# Patient Record
Sex: Female | Born: 1950 | Hispanic: No | Marital: Married | Smoking: Never smoker
Health system: Southern US, Community
[De-identification: ages and names within clinical notes are randomized; demographics above are authoritative.]

## PROBLEM LIST (undated history)

## (undated) DIAGNOSIS — M810 Age-related osteoporosis without current pathological fracture: Secondary | ICD-10-CM

## (undated) DIAGNOSIS — B019 Varicella without complication: Secondary | ICD-10-CM

## (undated) DIAGNOSIS — D869 Sarcoidosis, unspecified: Secondary | ICD-10-CM

## (undated) DIAGNOSIS — D72820 Lymphocytosis (symptomatic): Secondary | ICD-10-CM

## (undated) HISTORY — DX: Varicella without complication: B01.9

## (undated) HISTORY — DX: Lymphocytosis (symptomatic): D72.820

## (undated) HISTORY — DX: Age-related osteoporosis without current pathological fracture: M81.0

## (undated) HISTORY — DX: Sarcoidosis, unspecified: D86.9

---

## 1977-06-21 HISTORY — PX: TUBAL LIGATION: SHX77

## 1993-04-29 ENCOUNTER — Encounter: Payer: Self-pay | Admitting: Pulmonary Disease

## 1993-05-11 ENCOUNTER — Encounter: Payer: Self-pay | Admitting: Pulmonary Disease

## 1998-03-26 ENCOUNTER — Other Ambulatory Visit: Admission: RE | Admit: 1998-03-26 | Discharge: 1998-03-26 | Payer: Self-pay | Admitting: Family Medicine

## 1998-06-21 HISTORY — PX: UTERINE ARTERY EMBOLIZATION: SHX2629

## 1998-11-17 ENCOUNTER — Encounter: Payer: Self-pay | Admitting: Obstetrics and Gynecology

## 1998-11-17 ENCOUNTER — Ambulatory Visit (HOSPITAL_COMMUNITY): Admission: RE | Admit: 1998-11-17 | Discharge: 1998-11-17 | Payer: Self-pay | Admitting: Obstetrics and Gynecology

## 1998-11-20 ENCOUNTER — Ambulatory Visit (HOSPITAL_COMMUNITY): Admission: RE | Admit: 1998-11-20 | Discharge: 1998-11-20 | Payer: Self-pay | Admitting: Obstetrics and Gynecology

## 1998-11-20 ENCOUNTER — Other Ambulatory Visit: Admission: RE | Admit: 1998-11-20 | Discharge: 1998-11-20 | Payer: Self-pay | Admitting: Obstetrics and Gynecology

## 1998-11-20 ENCOUNTER — Encounter: Payer: Self-pay | Admitting: Obstetrics and Gynecology

## 1998-12-12 ENCOUNTER — Observation Stay (HOSPITAL_COMMUNITY): Admission: RE | Admit: 1998-12-12 | Discharge: 1998-12-13 | Payer: Self-pay | Admitting: Interventional Radiology

## 1998-12-12 ENCOUNTER — Encounter: Payer: Self-pay | Admitting: Interventional Radiology

## 2000-04-12 ENCOUNTER — Other Ambulatory Visit: Admission: RE | Admit: 2000-04-12 | Discharge: 2000-04-12 | Payer: Self-pay | Admitting: Family Medicine

## 2000-09-09 ENCOUNTER — Encounter: Payer: Self-pay | Admitting: Family Medicine

## 2000-09-09 ENCOUNTER — Encounter: Admission: RE | Admit: 2000-09-09 | Discharge: 2000-09-09 | Payer: Self-pay | Admitting: Family Medicine

## 2000-09-29 ENCOUNTER — Encounter: Admission: RE | Admit: 2000-09-29 | Discharge: 2000-09-29 | Payer: Self-pay | Admitting: Family Medicine

## 2000-09-29 ENCOUNTER — Encounter: Payer: Self-pay | Admitting: Family Medicine

## 2000-10-28 ENCOUNTER — Encounter: Payer: Self-pay | Admitting: Pulmonary Disease

## 2001-04-17 ENCOUNTER — Encounter: Admission: RE | Admit: 2001-04-17 | Discharge: 2001-04-17 | Payer: Self-pay | Admitting: Pulmonary Disease

## 2001-04-17 ENCOUNTER — Encounter: Payer: Self-pay | Admitting: Pulmonary Disease

## 2001-06-01 ENCOUNTER — Ambulatory Visit (HOSPITAL_COMMUNITY): Admission: RE | Admit: 2001-06-01 | Discharge: 2001-06-01 | Payer: Self-pay | Admitting: Family Medicine

## 2001-06-01 ENCOUNTER — Encounter: Payer: Self-pay | Admitting: Family Medicine

## 2002-08-17 ENCOUNTER — Other Ambulatory Visit: Admission: RE | Admit: 2002-08-17 | Discharge: 2002-08-17 | Payer: Self-pay | Admitting: Family Medicine

## 2002-12-17 ENCOUNTER — Encounter: Payer: Self-pay | Admitting: Pulmonary Disease

## 2002-12-17 ENCOUNTER — Encounter: Admission: RE | Admit: 2002-12-17 | Discharge: 2002-12-17 | Payer: Self-pay | Admitting: Pulmonary Disease

## 2002-12-27 ENCOUNTER — Ambulatory Visit (HOSPITAL_COMMUNITY): Admission: RE | Admit: 2002-12-27 | Discharge: 2002-12-27 | Payer: Self-pay | Admitting: Family Medicine

## 2002-12-27 ENCOUNTER — Encounter: Payer: Self-pay | Admitting: Family Medicine

## 2004-07-27 ENCOUNTER — Other Ambulatory Visit: Admission: RE | Admit: 2004-07-27 | Discharge: 2004-07-27 | Payer: Self-pay | Admitting: Family Medicine

## 2005-04-07 ENCOUNTER — Ambulatory Visit: Payer: Self-pay | Admitting: Pulmonary Disease

## 2005-05-25 ENCOUNTER — Encounter: Payer: Self-pay | Admitting: Pulmonary Disease

## 2005-10-21 ENCOUNTER — Ambulatory Visit (HOSPITAL_COMMUNITY): Admission: RE | Admit: 2005-10-21 | Discharge: 2005-10-21 | Payer: Self-pay | Admitting: Family Medicine

## 2006-03-15 ENCOUNTER — Ambulatory Visit: Payer: Self-pay | Admitting: Pulmonary Disease

## 2006-08-29 ENCOUNTER — Ambulatory Visit: Payer: Self-pay | Admitting: Pulmonary Disease

## 2007-02-23 ENCOUNTER — Ambulatory Visit: Payer: Self-pay | Admitting: Pulmonary Disease

## 2007-02-23 ENCOUNTER — Encounter: Payer: Self-pay | Admitting: Pulmonary Disease

## 2007-02-24 DIAGNOSIS — J984 Other disorders of lung: Secondary | ICD-10-CM

## 2007-02-24 DIAGNOSIS — J309 Allergic rhinitis, unspecified: Secondary | ICD-10-CM | POA: Insufficient documentation

## 2007-02-24 DIAGNOSIS — D869 Sarcoidosis, unspecified: Secondary | ICD-10-CM

## 2007-02-24 HISTORY — DX: Sarcoidosis, unspecified: D86.9

## 2007-03-10 ENCOUNTER — Ambulatory Visit (HOSPITAL_COMMUNITY): Admission: RE | Admit: 2007-03-10 | Discharge: 2007-03-10 | Payer: Self-pay | Admitting: Family Medicine

## 2007-03-22 ENCOUNTER — Ambulatory Visit: Payer: Self-pay | Admitting: Internal Medicine

## 2007-11-07 ENCOUNTER — Encounter: Payer: Self-pay | Admitting: Pulmonary Disease

## 2008-03-04 ENCOUNTER — Encounter: Payer: Self-pay | Admitting: Pulmonary Disease

## 2008-08-27 ENCOUNTER — Ambulatory Visit: Payer: Self-pay | Admitting: Pulmonary Disease

## 2008-09-25 ENCOUNTER — Ambulatory Visit: Payer: Self-pay | Admitting: Pulmonary Disease

## 2008-09-26 ENCOUNTER — Encounter: Payer: Self-pay | Admitting: Pulmonary Disease

## 2008-11-02 ENCOUNTER — Emergency Department (HOSPITAL_COMMUNITY): Admission: EM | Admit: 2008-11-02 | Discharge: 2008-11-02 | Payer: Self-pay | Admitting: Emergency Medicine

## 2009-11-02 ENCOUNTER — Ambulatory Visit: Payer: Self-pay | Admitting: Family Medicine

## 2009-11-02 DIAGNOSIS — J0100 Acute maxillary sinusitis, unspecified: Secondary | ICD-10-CM

## 2009-12-26 ENCOUNTER — Ambulatory Visit: Payer: Self-pay | Admitting: Pulmonary Disease

## 2010-05-01 ENCOUNTER — Ambulatory Visit (HOSPITAL_COMMUNITY): Admission: RE | Admit: 2010-05-01 | Discharge: 2010-05-01 | Payer: Self-pay | Admitting: Family Medicine

## 2010-05-12 ENCOUNTER — Telehealth: Payer: Self-pay | Admitting: Pulmonary Disease

## 2010-05-25 ENCOUNTER — Telehealth: Payer: Self-pay | Admitting: Pulmonary Disease

## 2010-07-12 ENCOUNTER — Encounter: Payer: Self-pay | Admitting: Family Medicine

## 2010-07-20 ENCOUNTER — Telehealth: Payer: Self-pay | Admitting: Pulmonary Disease

## 2010-07-23 NOTE — Progress Notes (Signed)
Summary: dr Reche Dixon asking for dr clance to return call  Phone Note From Other Clinic   Caller: [facility]PARTMENT OF [facility]MAT[facility]Y--[phone] Call For: clance Summary of Call: Dr. Reche Dixon is treating patient at his clinic, asking for Dr. Shelle Iron to call him about patient.  Dr. Reche Dixon is aware Dr. Shelle Iron will not be in until tomorrow. Initial call taken by: Lehman Prom,  May 12, 2010 9:50 AM  Follow-up for Phone Call        I will forward to Dr. Shelle Iron to address on his return . Carron Curie CMA  May 12, 2010 9:53 AM   Additional Follow-up for Phone Call Additional follow up Details #1::        talked with him.  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  I told him this is ok from a pulmonary standpoint. Additional Follow-up by: Barbaraann Share MD,  May 13, 2010 4:49 PM

## 2010-07-23 NOTE — Assessment & Plan Note (Signed)
Summary: rov for pulmonary sarcoidosis   Primary Provider/Referring Provider:  Dr Ace Gins  CC:  Yearly Follow up.  Pt states breathing is doing well overall.  States she will have an occ nonprod cough.  Denies SOB, wheezing, and tightness is chest.  .  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  At the last visit, her cxr was actually improved since she had been on humira per dermatology, and her pfts were normal.  She comes in for her yearly followup where she is doing very well.  She has minimal cough, is staying active, and has stable exertional tolerance.    Current Medications (verified): 1)  Humira 40 Mg/0.36ml Kit (Adalimumab) .... Every Other Week 2)  Amoxicillin 875 Mg Tabs (Amoxicillin) .... One By Mouth Two Times A Day  Allergies (verified): No Known Drug Allergies  Review of Systems       The patient complains of non-productive cough, indigestion, and joint stiffness or pain.  The patient denies shortness of breath with activity, shortness of breath at rest, productive cough, coughing up blood, chest pain, irregular heartbeats, acid heartburn, loss of appetite, weight change, abdominal pain, difficulty swallowing, sore throat, tooth/dental problems, headaches, nasal congestion/difficulty breathing through nose, sneezing, itching, ear ache, anxiety, depression, hand/feet swelling, rash, change in color of mucus, and fever.    Vital Signs:  Patient profile:   60 year old female Height:      61.5 inches Weight:      119 pounds BMI:     22.20 O2 Sat:      98 % on Room air Temp:     98.1 degrees F oral Pulse rate:   90 / minute BP sitting:   120 / 80  (left arm) Cuff size:   regular  Vitals Entered By: Gweneth Dimitri RN (December 26, 2009 12:01 PM)  O2 Flow:  Room air CC: Yearly Follow up.  Pt states breathing is doing well overall.  States she will have an occ nonprod cough.  Denies SOB, wheezing, tightness is chest.   Comments  Medications reviewed with patient Daytime contact number verified with patient. Gweneth Dimitri RN  December 26, 2009 12:01 PM    Physical Exam  General:  wd female in nad Lungs:  totally clear to auscultation Heart:  rrr, no mrg Extremities:  no edema or cyanosis Neurologic:  alert and oriented, moves all 4.   Impression & Recommendations:  Problem # 1:  SARCOIDOSIS (ICD-135) the pt is doing well from a pulmonary standpoint.  She has excellent exercise tolerance, and no signficant cough or chest congestion.  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  Will check her yearly cxr today, but will hold off on pfts this year since she is doing so well.  She will need next year though, and as needed if she develops increased symptoms.  Medications Added to Medication List This Visit: 1)  Humira 40 Mg/0.63ml Kit (Adalimumab) .... Every other week  Other Orders: Est. Patient Level III (21308) T-2 View CXR (71020TC)  Patient Instructions: 1)  will check cxr today, and will call you with results.  Will also send the results to your dermatologist. 2)  followup with me in one year, or sooner if problems.

## 2010-07-23 NOTE — Assessment & Plan Note (Signed)
Summary: Sinus pressure, eye pressure, runny nose-yellowih x 1 wk rm 3   Vital Signs:  Patient Profile:   60 Years Old Female CC:      Cold & URI symptoms Height:     62 inches Weight:      117 pounds O2 Sat:      100 % O2 treatment:    Room Air Temp:     97.8 degrees F Pulse rate:   76 / minute Pulse rhythm:   regular Resp:     16 per minute BP sitting:   140 / 75  (right arm) Cuff size:   regular  Vitals Entered By: Areta Haber CMA (Nov 02, 2009 1:15 PM)                  Current Allergies: No known allergies History of Present Illness Chief Complaint: Cold & URI symptoms History of Present Illness: Subjective:  Patient complains of persistent sinus congestion over the past several months, worse over the past month.  One week ago she developed right facial soreness/fullness, and increased nasal congestion.  No fevers, chills, and sweats.  No sore throat or cough  Current Problems: ACUTE MAXILLARY SINUSITIS (ICD-461.0) DIABETES MELLITUS, TYPE I (ICD-250.01) PULMONARY NODULE (ICD-518.89) ALLERGIC RHINITIS (ICD-477.9) SARCOIDOSIS (ICD-135)   Current Meds HUMIRA 20 MG/0.4ML KIT (ADALIMUMAB) 1 injection every other week AMOXICILLIN 875 MG TABS (AMOXICILLIN) One by mouth two times a day  REVIEW OF SYSTEMS Constitutional Symptoms      Denies fever, chills, night sweats, weight loss, weight gain, and fatigue.  Eyes       Complains of eye pain.      Denies change in vision, eye discharge, glasses, contact lenses, and eye surgery. Ear/Nose/Throat/Mouth       Complains of frequent runny nose and sinus problems.      Denies hearing loss/aids, change in hearing, ear pain, ear discharge, dizziness, frequent nose bleeds, sore throat, hoarseness, and tooth pain or bleeding.      Comments: yellowish x 1 wk Respiratory       Denies dry cough, productive cough, wheezing, shortness of breath, asthma, bronchitis, and emphysema/COPD.  Cardiovascular       Denies murmurs, chest  pain, and tires easily with exhertion.    Gastrointestinal       Denies stomach pain, nausea/vomiting, diarrhea, constipation, blood in bowel movements, and indigestion. Genitourniary       Denies painful urination, kidney stones, and loss of urinary control. Neurological       Complains of headaches.      Denies paralysis, seizures, and fainting/blackouts. Musculoskeletal       Denies muscle pain, joint pain, joint stiffness, decreased range of motion, redness, swelling, muscle weakness, and gout.  Skin       Denies bruising, unusual mles/lumps or sores, and hair/skin or nail changes.  Psych       Denies mood changes, temper/anger issues, anxiety/stress, speech problems, depression, and sleep problems. Other Comments: Pt has not seen PCP for this.   Past History:  Risk Factors: Smoking Status: never (11/02/2009)  Past Medical History: Psacordosis  Past Surgical History: Tubal ligation  Family History: Family History Hypertension  Social History: Married Never Smoked Alcohol use-yes - occassionally Drug use-no Regular exercise-yes Drug Use:  no Does Patient Exercise:  yes   Objective:  No acute distress  Eyes:  Pupils are equal, round, and reactive to light and accomdation.  Extraocular movement is intact.  Conjunctivae are not inflamed.  Ears:  Canals normal.  Tympanic membranes normal.   Nose:  Normal septum.  Normal turbinates, mildly congested.   Right maxillary sinus tenderness present.  Mouth:  Mild tenderness over right mandibular crowns Pharynx:  Normal  Neck:  Supple.  No adenopathy is present.  Assessment New Problems: ACUTE MAXILLARY SINUSITIS (ICD-461.0) DIABETES MELLITUS, TYPE I (ICD-250.01)   Plan New Medications/Changes: AMOXICILLIN 875 MG TABS (AMOXICILLIN) One by mouth two times a day  #20 x 0, 11/02/2009, Donna Christen MD  New Orders: New Patient Level III [phone] Planning Comments:   Begin amoxicillin for 10 days.   Expectorant/decongestant.  Saline nasal irrigation. Recommend that she follow-up with her dentist also.   The patient and/or caregiver has been counseled thoroughly with regard to medications prescribed including dosage, schedule, interactions, rationale for use, and possible side effects and they verbalize understanding.  Diagnoses and expected course of recovery discussed and will return if not improved as expected or if the condition worsens. Patient and/or caregiver verbalized understanding.  Prescriptions: AMOXICILLIN 875 MG TABS (AMOXICILLIN) One by mouth two times a day  #20 x 0   Entered and Authorized by:   Donna Christen MD   Signed by:   Donna Christen MD on 11/02/2009   Method used:   Print then Give to Patient   RxID:   [id number]

## 2010-07-23 NOTE — Progress Notes (Signed)
Summary: dr hall req to speak to kc (at his convenience) re: pt  Phone Note From Other Clinic   Caller: dr Lurena Joiner hall at [facility] Call For: clance Summary of Call: requests to speak to dr clance re: pt's meds/ does kc want to eval pt for flareup? pt's coughing has increased- new lesions. dr hall has questions concerning recs for meds. call at your convenience (no need to get nurse or dr out of room). pager # [phone] Initial call taken by: Tivis Ringer, CNA,  May 25, 2010 11:54 AM  Follow-up for Phone Call        called and spoke with resident, letting her know I had already spoken to her attending (jorizzo) on 11/22. Follow-up by: Barbaraann Share MD,  June 01, 2010 4:43 PM

## 2010-08-06 NOTE — Progress Notes (Signed)
Summary: speak to nurse  Phone Note Call from Patient Call back at Home Phone [phone]   Caller: Patient Call For: clance Summary of Call: Pt states that her dermatologist (Dr. Reche Dixon) wants to put her on another med but he wants to speak with [facility] before he does this he can be reached at [phone]. Initial call taken by: Darletta Moll,  July 20, 2010 10:05 AM  Follow-up for Phone Call        called and spoke with pt.  pt states she has sarcoid and is seeing Dr. Reche Dixon a dermatologist at [facility] for the sacroid on her skin.  Pt states she has been on Humira injections twice a week for 2 years for the sarcoid on her skin but is now having outbreaks.  Dr. Reche Dixon wants to start pt on Methotrexate to help with these outbreaks but would like to speak to Dr. Shelle Iron first to discuss options.  Dr. Felicita Gage number is [phone].  Aundra Millet Reynolds LPN  July 20, 2010 10:18 AM   Additional Follow-up for Phone Call Additional follow up Details #1::        have spoken with him before about mtx...told was ok from my standpoint. left message on his answering machine with my number to call back. Additional Follow-up by: Barbaraann Share MD,  July 29, 2010 10:14 AM

## 2011-04-20 ENCOUNTER — Other Ambulatory Visit (HOSPITAL_COMMUNITY): Payer: Self-pay | Admitting: Family Medicine

## 2011-04-20 DIAGNOSIS — Z1231 Encounter for screening mammogram for malignant neoplasm of breast: Secondary | ICD-10-CM

## 2011-05-19 ENCOUNTER — Ambulatory Visit (HOSPITAL_COMMUNITY): Payer: Self-pay

## 2011-07-01 ENCOUNTER — Ambulatory Visit (HOSPITAL_COMMUNITY)
Admission: RE | Admit: 2011-07-01 | Discharge: 2011-07-01 | Disposition: A | Discharge status: Home / Self Care | Payer: BC Managed Care – PPO | Source: Ambulatory Visit | Attending: Family Medicine | Admitting: Family Medicine

## 2011-07-01 DIAGNOSIS — Z1231 Encounter for screening mammogram for malignant neoplasm of breast: Secondary | ICD-10-CM

## 2011-08-19 ENCOUNTER — Ambulatory Visit: Payer: BC Managed Care – PPO | Admitting: Pulmonary Disease

## 2011-08-31 ENCOUNTER — Ambulatory Visit (INDEPENDENT_AMBULATORY_CARE_PROVIDER_SITE_OTHER): Payer: BC Managed Care – PPO | Admitting: Pulmonary Disease

## 2011-08-31 ENCOUNTER — Ambulatory Visit (INDEPENDENT_AMBULATORY_CARE_PROVIDER_SITE_OTHER)
Admission: RE | Admit: 2011-08-31 | Discharge: 2011-08-31 | Disposition: A | Discharge status: Home / Self Care | Payer: BC Managed Care – PPO | Source: Ambulatory Visit | Attending: Pulmonary Disease | Admitting: Pulmonary Disease

## 2011-08-31 ENCOUNTER — Encounter: Payer: Self-pay | Admitting: Pulmonary Disease

## 2011-08-31 VITALS — BP 146/80 | HR 94 | Temp 98.9°F | Ht 61.5 in | Wt 122.6 lb

## 2011-08-31 DIAGNOSIS — D869 Sarcoidosis, unspecified: Secondary | ICD-10-CM

## 2011-08-31 NOTE — Progress Notes (Signed)
  Subjective:    Patient ID: Courtney Bray, female    DOB: 12-17-1950, 61 y.o.   MRN: [id number]  HPI The patient comes in today for followup of her pulmonary sarcoidosis.  This primarily manifest as cough, and also skin involvement.  She is being followed closely at [facility] for her cutaneous sarcoid, and is being managed on Humira at this point.  She has done very well with the medication, and it has also helped control her cough.  The patient is very satisfied with her exertional tolerance at this time, and denies any issues with shortness of breath.  As stated above, her cough has not been a problem since being on the Humira.  She has not had a recent chest x-ray or pulmonary function studies.   Review of Systems  Constitutional: Negative for fever and unexpected weight change.  HENT: Negative for ear pain, nosebleeds, congestion, sore throat, rhinorrhea, sneezing, trouble swallowing, dental problem, postnasal drip and sinus pressure.   Eyes: Negative for redness and itching.  Respiratory: Negative for cough, chest tightness, shortness of breath and wheezing.   Cardiovascular: Negative for palpitations and leg swelling.  Gastrointestinal: Negative for nausea and vomiting.  Genitourinary: Negative for dysuria.  Musculoskeletal: Negative for joint swelling.  Skin: Negative for rash.  Neurological: Negative for headaches.  Hematological: Does not bruise/bleed easily.  Psychiatric/Behavioral: Negative for dysphoric mood. The patient is not nervous/anxious.        Objective:   Physical Exam Well-developed female in no acute distress Nose without purulence or discharge noted Chest completely clear to auscultation, no wheezing Cardiac exam with regular rate and rhythm Lower extremities without edema, no cyanosis Alert and oriented, moves all 4 extremities.       Assessment & Plan:

## 2011-08-31 NOTE — Assessment & Plan Note (Signed)
The patient is doing very well from a pulmonary standpoint.  She is having no significant cough or dyspnea on exertion.  I suspect the Humira is helping her pulmonary disease as well as her skin disease.  She does need to have a followup chest x-ray and pulmonary function studies, and we will schedule these today.  I have asked her to stay as active as possible, and to call as if she sees any change in her breathing.  Otherwise, she will followup with me in one year.

## 2011-08-31 NOTE — Patient Instructions (Signed)
Will check cxr today, and call you with results.  Please schedule followup breathing studies at your convenience over the next 1-2 mos.  Will call you with results. followup with me in one year if doing well.

## 2011-09-01 ENCOUNTER — Telehealth: Payer: Self-pay | Admitting: Pulmonary Disease

## 2011-09-01 NOTE — Progress Notes (Signed)
Quick Note:  Spoke with pt. I informed her of cxr results per Dr. Shelle Iron. She verbalized understanding of this and voiced no further questions/concerns at this time. ______

## 2011-09-01 NOTE — Telephone Encounter (Signed)
Notes Recorded by Tylene Fantasia. Reynolds, LPN on 1/61/0960 at 2:22 PM [facility] for pt TCB ------  Notes Recorded by Barbaraann Share, MD on 08/31/2011 at 6:06 PM Please let pt know that her cxr is stable from prior. Good news.   Called, spoke with pt.  I informed her of cxr results per Dr. Shelle Iron.  She verbalized understanding of this and voiced no further questions/concerns at this time.

## 2013-01-19 ENCOUNTER — Telehealth: Payer: Self-pay | Admitting: Pulmonary Disease

## 2013-01-19 NOTE — Telephone Encounter (Signed)
Patient Instructions  08/2011  Will check cxr today, and call you with results.  Please schedule followup breathing studies at your convenience over the next 1-2 mos. Will call you with results.  followup with me in one year if doing well.   --- Pt never had PFT done as advised. She is even over due for appt. Pt has nothing scheduled yet. She wants to know if she still needs the PFT prior to coming in. I advised will confirm with [facility] if he would like PFT prior to seeing her or for her to come in for appt 1st. She is fine with waiting for him to come back in office. Please advise KC thanks

## 2013-01-29 NOTE — Telephone Encounter (Signed)
Would like to have pfts available when I see her if she never had done last year.  Can be same or different day.

## 2013-01-30 NOTE — Telephone Encounter (Signed)
Called spoke with patient, advised pt that [facility] would like for her to have a PFT prior to ov w/ him.  1st available for PFT here is 9.11.14-- pt stated she will be on vacation at that time.  PFT and OV scheduled with KC 9.25.14 @ 0900 and 1015.  Pt okay with this date and time and will call the office if anything further is needed prior to appt.  Nothing further needed at this time; will sign off.

## 2013-03-15 ENCOUNTER — Ambulatory Visit (INDEPENDENT_AMBULATORY_CARE_PROVIDER_SITE_OTHER): Payer: BC Managed Care – PPO | Admitting: Pulmonary Disease

## 2013-03-15 ENCOUNTER — Encounter: Payer: Self-pay | Admitting: Pulmonary Disease

## 2013-03-15 VITALS — BP 130/82 | HR 82 | Temp 97.6°F | Ht 61.0 in | Wt 120.0 lb

## 2013-03-15 DIAGNOSIS — D869 Sarcoidosis, unspecified: Secondary | ICD-10-CM

## 2013-03-15 LAB — PULMONARY FUNCTION TEST

## 2013-03-15 NOTE — Assessment & Plan Note (Signed)
The patient is doing very well from a sarcoid standpoint, with no increasing shortness of breath or decreased exertional tolerance.  Her x-ray has been stable, and although her total lung capacity it is a little lower, her diffusion capacity is completely normal and stable.  Her cough and skin changes are totally controlled on Humira through her dermatologist.  Overall, she is satisfied with her symptoms, and I have encouraged her to continue on an aggressive exercise and conditioning program.  I would like to see her back in one year, but do not feel that she needs yearly x-rays and PFTs.  Will simply do these periodically and as needed.

## 2013-03-15 NOTE — Patient Instructions (Addendum)
Your breathing studies are stable.  Good news.  Will check xrays and PFT's periodically rather than yearly Stay as active as possible. Make sure you get your flu shot this fall. followup with me in one year, but please call if having increased symptoms.

## 2013-03-15 NOTE — Progress Notes (Signed)
  Subjective:    Patient ID: Courtney Bray, female    DOB: 1950/11/26, 62 y.o.   MRN: [id number]  HPI Patient comes in today for followup of her known sarcoidosis.  This primarily manifests as skin changes, as well as cough.  She is being treated with Humira by her dermatologist, and this has also controlled her cough very well.  She reports no shortness of breath, and has been staying very active.  Her chest x-ray last year was totally stable, and her breathing test today are stable as well.   Review of Systems  Constitutional: Negative for fever and unexpected weight change.  HENT: Negative for ear pain, nosebleeds, congestion, sore throat, rhinorrhea, sneezing, trouble swallowing, dental problem, postnasal drip and sinus pressure.   Eyes: Negative for redness and itching.  Respiratory: Negative for cough, chest tightness, shortness of breath and wheezing.   Cardiovascular: Negative for palpitations and leg swelling.  Gastrointestinal: Negative for nausea and vomiting.  Genitourinary: Negative for dysuria.  Musculoskeletal: Negative for joint swelling.  Skin: Negative for rash.  Neurological: Negative for headaches.  Hematological: Does not bruise/bleed easily.  Psychiatric/Behavioral: Negative for dysphoric mood. The patient is not nervous/anxious.        Objective:   Physical Exam Well-developed female in no acute distress Nose without purulence or discharge noted Neck without lymphadenopathy or thyromegaly Chest totally clear to auscultation, no wheezing Cardiac exam is regular rate and rhythm Lower extremities without edema, no cyanosis Alert and oriented, moves all 4 extremities.       Assessment & Plan:

## 2013-03-15 NOTE — Progress Notes (Signed)
PFT done today. 

## 2013-04-20 ENCOUNTER — Encounter: Payer: Self-pay | Admitting: Pulmonary Disease

## 2013-04-26 ENCOUNTER — Other Ambulatory Visit: Payer: Self-pay

## 2013-09-11 ENCOUNTER — Other Ambulatory Visit (HOSPITAL_COMMUNITY): Payer: Self-pay | Admitting: Family Medicine

## 2013-09-11 DIAGNOSIS — Z1231 Encounter for screening mammogram for malignant neoplasm of breast: Secondary | ICD-10-CM

## 2013-09-20 ENCOUNTER — Ambulatory Visit (HOSPITAL_COMMUNITY): Payer: BC Managed Care – PPO

## 2013-10-08 ENCOUNTER — Ambulatory Visit (HOSPITAL_COMMUNITY)
Admission: RE | Admit: 2013-10-08 | Discharge: 2013-10-08 | Disposition: A | Discharge status: Home / Self Care | Payer: BC Managed Care – PPO | Source: Ambulatory Visit | Attending: Family Medicine | Admitting: Family Medicine

## 2013-10-08 DIAGNOSIS — Z1231 Encounter for screening mammogram for malignant neoplasm of breast: Secondary | ICD-10-CM

## 2014-03-14 ENCOUNTER — Ambulatory Visit: Payer: BC Managed Care – PPO | Admitting: Pulmonary Disease

## 2014-03-20 ENCOUNTER — Ambulatory Visit: Payer: BC Managed Care – PPO | Admitting: Pulmonary Disease

## 2014-04-22 ENCOUNTER — Encounter: Payer: Self-pay | Admitting: Pulmonary Disease

## 2014-04-22 ENCOUNTER — Ambulatory Visit (INDEPENDENT_AMBULATORY_CARE_PROVIDER_SITE_OTHER): Payer: BC Managed Care – PPO | Admitting: Pulmonary Disease

## 2014-04-22 VITALS — BP 110/60 | HR 80 | Temp 98.3°F | Ht 61.5 in | Wt 124.8 lb

## 2014-04-22 DIAGNOSIS — D869 Sarcoidosis, unspecified: Secondary | ICD-10-CM

## 2014-04-22 NOTE — Assessment & Plan Note (Signed)
The patient is doing very well from a pulmonary standpoint with respect to her sarcoidosis. She is continuing treatment by dermatology for her skin changes, and this in turn has resolved her pulmonary symptoms as well. I would like to see her back in one year to check on things, but she is to call if she has increased symptoms. I will check a chest x-ray at her next visit.

## 2014-04-22 NOTE — Progress Notes (Signed)
   Subjective:    Patient ID: Courtney Bray, female    DOB: 1950/07/17, 63 y.o.   MRN: [id number]  HPI The patient comes in today for follow-up of her known sarcoidosis. This is primarily manifesting as skin involvement, and the patient is on Humira through her dermatologist with significant control of pulmonary symptoms and skin changes. She denies any cough, and has had excellent exertional tolerance. She feels that she is asymptomatic from a pulmonary standpoint.   Review of Systems  Constitutional: Negative for fever and unexpected weight change.  HENT: Negative for congestion, dental problem, ear pain, nosebleeds, postnasal drip, rhinorrhea, sinus pressure, sneezing, sore throat and trouble swallowing.   Eyes: Negative for redness and itching.  Respiratory: Negative for cough, chest tightness, shortness of breath and wheezing.   Cardiovascular: Negative for palpitations and leg swelling.  Gastrointestinal: Negative for nausea and vomiting.  Genitourinary: Negative for dysuria.  Musculoskeletal: Negative for joint swelling.  Skin: Negative for rash.  Neurological: Negative for headaches.  Hematological: Does not bruise/bleed easily.  Psychiatric/Behavioral: Negative for dysphoric mood. The patient is not nervous/anxious.        Objective:   Physical Exam Well-developed female in no acute distress Nose without purulence or discharge noted Neck without lymphadenopathy or thyromegaly Chest totally clear to auscultation, no wheezing Cardiac exam with regular rate and rhythm Lower extremities without edema, no cyanosis Alert and oriented, moves all 4 extremities.       Assessment & Plan:

## 2014-04-22 NOTE — Patient Instructions (Signed)
Continue on your medication thru your dermatologist. Please call if you feel you are having increased pulmonary symptoms. followup with me in one year, and will check a chest xray the same day.

## 2014-10-21 ENCOUNTER — Encounter: Payer: Self-pay | Admitting: Internal Medicine

## 2014-10-21 ENCOUNTER — Ambulatory Visit (INDEPENDENT_AMBULATORY_CARE_PROVIDER_SITE_OTHER): Payer: 59 | Admitting: Internal Medicine

## 2014-10-21 VITALS — BP 132/72 | Temp 98.7°F | Ht 61.0 in | Wt 120.0 lb

## 2014-10-21 DIAGNOSIS — D860 Sarcoidosis of lung: Secondary | ICD-10-CM | POA: Insufficient documentation

## 2014-10-21 DIAGNOSIS — D863 Sarcoidosis of skin: Secondary | ICD-10-CM | POA: Insufficient documentation

## 2014-10-21 DIAGNOSIS — Z79899 Other long term (current) drug therapy: Secondary | ICD-10-CM | POA: Insufficient documentation

## 2014-10-21 DIAGNOSIS — M810 Age-related osteoporosis without current pathological fracture: Secondary | ICD-10-CM | POA: Diagnosis not present

## 2014-10-21 DIAGNOSIS — Z7689 Persons encountering health services in other specified circumstances: Secondary | ICD-10-CM

## 2014-10-21 DIAGNOSIS — Z7189 Other specified counseling: Secondary | ICD-10-CM

## 2014-10-21 NOTE — Progress Notes (Signed)
Pre visit review using our clinic review tool, if applicable. No additional management support is needed unless otherwise documented below in the visit note.  Chief Complaint  Patient presents with  . Establish Care    HPI: Patient  Courtney Bray  64 y.o. comes in today for new patient Health Care visit  Previous PCP [facility] Cre[facility] has left that practice .  Had check up there ? A year ago  Specialists: dr Shelle Ironlance for sarcoid involving lungs and skin  Breathing stable  Yearly check and pfts  Every 3 years  Coughing and wheezing were sx .  Derm  At [facility] dr Renaldo ReelHuang    On humira for 7 years and very helpful for skin and   Lungs also .  Scalp and  Forehead.  Hair loss    Yearly  Check gets ppd yearly  or cxray  bp: is ususally good  Dx with osteoporosis by dexa  Had rash rx to high dose rx vit d .  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   Health Maintenance  Topic Date Due  . HEMOGLOBIN A1C  07/03/1950  . PNEUMOCOCCAL POLYSACCHARIDE VACCINE (1) 02/18/1953  . FOOT EXAM  02/18/1961  . OPHTHALMOLOGY EXAM  02/18/1961  . URINE MICROALBUMIN  02/18/1961  . TETANUS/TDAP  02/18/1970  . HIV Screening  10/20/2015 (Originally 02/18/1966)  . INFLUENZA VACCINE  01/20/2015  . MAMMOGRAM  10/09/2015  . PAP SMEAR  10/20/2015  . COLONOSCOPY  10/20/2018  . ZOSTAVAX  Completed   Health Maintenance Review LIFESTYLE:  Exercise:   3 x per week . Walks no phsycial limitiation Tobacco/ETS: no   Alcohol: per day  ocass Sugar beverages:   Not  Sleep: 5-6 hours  Drug use: no Bone density:  Years ago osteopenia   Then osteoporsis  Colonoscopy:  2010 in [place].  ? 10 years recall  PAP: 2014?  g3 p4   MAMMO: 2015 Sis    ROS:  GEN/ HEENT: No fever, significant weight changes sweats headaches vision problems hearing changes, CV/ PULM; No chest pain shortness of breath cough, syncope,edema  change in exercise tolerance. ocass skipped beat as per husband but no  palpitations of sx  GI /GU: No adominal pain, vomiting, change in bowel habits. No blood in the stool. No significant GU symptoms. SKIN/HEME: ,no new see above acute skin rashes suspicious lesions or bleeding. No lymphadenopathy, nodules, masses.  NEURO/ PSYCH:  No neurologic signs such as weakness numbness. No depression anxiety. IMM/ Allergy: No unusual infections.  Allergy .   REST of 12 system review negative except as per HPI   Past Medical History  Diagnosis Date  . 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  PFT's 2010:  No obstruction, TLC 3.93 (87%), DLCO 89%. PFT's 2014:  No obstruction, TLC 3.07, DLCO 91%    . Chicken pox     Past Surgical History  Procedure Laterality Date  . Tubal ligation  1979  . Uterine artery embolization  2000    Family History  Problem Relation Age of Onset  . Heart disease Father   . Stroke Father   . Dementia Father   . Dementia Mother   . High blood pressure Mother   . High blood pressure Sister     History   Social History  . Marital  Status: Married    Spouse Name: N/A  . Number of Children: N/A  . Years of Education: N/A   Social History Main Topics  . Smoking status: Never Smoker   . Smokeless tobacco: Never Used  . Alcohol Use: 0.0 oz/week    0 Standard drinks or equivalent per week     Comment: Occasional  . Drug Use: No  . Sexual Activity:    Partners: Male   Other Topics Concern  . 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   Orig from [place]   in [place] since 1988   [name]     Outpatient Encounter Prescriptions as of 10/21/2014  . Order #: [id number] Class:  Historical Med  . [DISCONTINUED] Order #: [id number] Class: Historical Med    EXAM:  BP 132/72 mmHg  Temp(Src) 98.7 F (37.1 C) (Oral)  Ht  (1.549 m)  Wt 120 lb (54.432 kg)  BMI 22.69 kg/m2  Body mass index is 22.69 kg/(m^2).  Physical Exam: Vital signs reviewed GNF:AOZH is a well-developed well-nourished alert cooperative    who appearsr stated age in no acute distress.  HEENT: normocephalic atraumatic , Eyes: PERRL EOM's full, conjunctiva clear, NECK: supple without masses, thyromegaly or bruits. CHEST/PULM:  Clear to auscultation and percussion breath sounds equal no wheeze , rales or rhonchi. No chest wall deformities or tenderness. CV: PMI is nondisplaced, S1 S2 no gallops, murmurs, rubs. Peripheral pulses are full without delay.No JVD .  ABDOMEN: Bowel sounds normal nontender  No guard or rebound, no hepato splenomegal no CVA tenderness.   Extremtities:  No clubbing cyanosis or edema, no acute joint swelling or redness no focal atrophy NEURO:  Oriented x3, cranial nerves 3-12 appear to be intact, no obvious focal weakness,gait within normal limits no abnormal reflexes or asymmetrical SKIN: No acute rashes normal turgor, color, no bruising or petechiae. PSYCH: Oriented, good eye contact, no obvious depression anxiety, cognition and judgment appear normal. 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  Immunizations   Records of any kind  Last pap and report .  Plan wellness and labs in 6 months or as needed . Contact us in the interim as needed .   Healthy lifestyle includes : At least 150 minutes of exercise weeks  , weight at healthy  levels, which is usually   BMI 19-25. Avoid trans fats and processed foods;  Increase fresh fruits and veges to 5 servings per day. And avoid sweet beverages including tea and juice. Mediterranean diet with olive oil and nuts have been noted to be heart and brain healthy . Avoid tobacco products . Limit  alcohol to  7 per week for women and 14 servings for men.  Get adequate sleep . Wear seat belts . Don't text and drive .       Neta Mends. Joshuajames Moehring M.D.  EHR reported dm type 1 and patient doesn't have DM so removed from records.

## 2014-10-21 NOTE — Patient Instructions (Signed)
Get us records from last 2 years  Primary care   Check ups and labs  .  Immunizations   Records of any kind  Last pap and report .  Plan wellness and labs in 6 months or as needed . Contact us in the interim as needed .   Healthy lifestyle includes : At least 150 minutes of exercise weeks  , weight at healthy levels, which is usually   BMI 19-25. Avoid trans fats and processed foods;  Increase fresh fruits and veges to 5 servings per day. And avoid sweet beverages including tea and juice. Mediterranean diet with olive oil and nuts have been noted to be heart and brain healthy . Avoid tobacco products . Limit  alcohol to  7 per week for women and 14 servings for men.  Get adequate sleep . Wear seat belts . Don't text and drive .

## 2014-10-28 ENCOUNTER — Encounter: Payer: Self-pay | Admitting: Internal Medicine

## 2014-10-28 ENCOUNTER — Ambulatory Visit (INDEPENDENT_AMBULATORY_CARE_PROVIDER_SITE_OTHER): Payer: 59 | Admitting: Internal Medicine

## 2014-10-28 VITALS — BP 130/70 | HR 88 | Temp 99.7°F | Ht 61.0 in | Wt 118.5 lb

## 2014-10-28 DIAGNOSIS — Z79899 Other long term (current) drug therapy: Secondary | ICD-10-CM | POA: Diagnosis not present

## 2014-10-28 DIAGNOSIS — D860 Sarcoidosis of lung: Secondary | ICD-10-CM

## 2014-10-28 DIAGNOSIS — J069 Acute upper respiratory infection, unspecified: Secondary | ICD-10-CM

## 2014-10-28 DIAGNOSIS — B9789 Other viral agents as the cause of diseases classified elsewhere: Principal | ICD-10-CM

## 2014-10-28 MED ORDER — HYDROCODONE-HOMATROPINE 5-1.5 MG/5ML PO SYRP: 5.0000 mL | ORAL_SOLUTION | ORAL | Status: DC | PRN

## 2014-10-28 NOTE — Progress Notes (Signed)
Pre visit review using our clinic review tool, if applicable. No additional management support is needed unless otherwise documented below in the visit note.  Chief Complaint  Patient presents with  . Cough  . Sore Throat  . Fatigue    HPI: Patient Courtney Bray  comes in today for SDA for  new problem evaluation.after last visit did develop st x 3 days  Then better and now cough croupy like spaam and here with husband who has health concern about her . Lots of coughing  And lungs hurt. Felt a bit better yesterday tired today temp 99 no chills  No fever. Mostly dry cough   Taking nyquil .   Tired  Some stridor   .   ? If sob congested .  No hemoptysis  No acute wheezing   Concern because of sarcoid dx and on humira.  ROS: See pertinent positives and negatives per HPI. No vomiting new rash etc  Has copy of colonoscopy 2010 10 year recal  Past Medical History  Diagnosis Date  . 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  PFT's 2010:  No obstruction, TLC 3.93 (87%), DLCO 89%. PFT's 2014:  No obstruction, TLC 3.07, DLCO 91%    . Chicken pox     Family History  Problem Relation Age of Onset  . Heart disease Father   . Stroke Father   . Dementia Father   . Dementia Mother   . High blood pressure Mother   . High blood pressure Sister     History   Social History  . Marital Status: Married    Spouse Name: N/A  . Number of Children: N/A  . Years of Education: N/A   Social History Main Topics  . Smoking status: Never Smoker   . Smokeless tobacco: Never Used  . Alcohol Use: 0.0 oz/week    0 Standard drinks or equivalent per week     Comment: Occasional  . Drug Use: No  . Sexual Activity:    Partners: Male   Other Topics Concern  . 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   Orig from [place]   in [place] since 1988   [name]     Outpatient Prescriptions Prior to Visit  Medication Sig Dispense Refill  . HUMIRA PEN 40 MG/0.8ML PNKT Inject every other week for sarcoidosis     No facility-administered medications prior to visit.     EXAM:  BP 130/70 mmHg  Pulse 88  Temp(Src) 99.7 F (37.6 C) (Oral)  Ht 5\' 1"  (1.549 m)  Wt 118 lb 8 oz (53.751 kg)  BMI 22.40 kg/m2  SpO2 98%  Body mass index is 22.4 kg/(m^2).  GENERAL: vitals reviewed and listed above, alert, oriented, appears well hydrated and in no acute distress  Min congestion non toxic  HEENT: atraumatic, conjunctiva  clear, no obvious abnormalities on inspection of external nose and ears tms clear OP : no lesion edema or exudate drainage tracts  NECK: no obvious masses on  inspection palpation  LUNGS: clear to auscultation bilaterally, no wheezes, rales or rhonchi, good air movement CV: HRRR, no clubbing cyanosis or  peripheral edema nl cap refill  MS: moves all extremities without noticeable focal  abnormality PSYCH: pleasant and cooperative, no obvious depression or anxiety  ASSESSMENT AND PLAN:  Discussed the following assessment and plan:  Viral upper respiratory tract infection with cough - croup like exam is reassuring low threshold for intervention w/hx sarcoid cough med for [place]   Sarcoidosis, lung  High risk medication use - humira  ask prescriber  if should delay med when  acutely ill  Total visit 25mins > 50% spent counseling and coordinating care as indicated in above note and in instructions to patient .    Expectant management. huband also     -Patient advised to return or notify health care team  if symptoms worsen ,persist or new concerns arise.  Patient Instructions  This acts like viral  resp infection  croup . Marland Kitchen. You exam is reassuring today . At this time supportive  Cares   Should be feeling improved malaise but the end of the week Contact  us if fever and chills   Shortness of breath  Ask about  [facility]      Iain Sawchuk K. Archer Moist M.D.

## 2014-10-28 NOTE — Patient Instructions (Signed)
This acts like viral  resp infection croup . Marland Kitchen. You exam is reassuring today . At this time supportive  Cares   Should be feeling improved malaise but the end of the week Contact  us if fever and chills   Shortness of breath  Ask about  Whether [facility]

## 2014-11-05 ENCOUNTER — Encounter: Payer: Self-pay | Admitting: Internal Medicine

## 2014-12-16 ENCOUNTER — Other Ambulatory Visit: Payer: Self-pay

## 2015-03-25 ENCOUNTER — Other Ambulatory Visit (INDEPENDENT_AMBULATORY_CARE_PROVIDER_SITE_OTHER): Payer: 59

## 2015-03-25 DIAGNOSIS — Z0000 Encounter for general adult medical examination without abnormal findings: Secondary | ICD-10-CM

## 2015-03-25 LAB — BASIC METABOLIC PANEL
BUN: 12 mg/dL (ref 6–23)
CALCIUM: 9.3 mg/dL (ref 8.4–10.5)
CO2: 29 mEq/L (ref 19–32)
Chloride: 105 mEq/L (ref 96–112)
Creatinine, Ser: 0.64 mg/dL (ref 0.40–1.20)
GFR: 120.11 mL/min (ref >60.00)
GLUCOSE: 88 mg/dL (ref 70–99)
Potassium: 4.3 mEq/L (ref 3.5–5.1)
SODIUM: 140 meq/L (ref 135–145)

## 2015-03-25 LAB — LIPID PANEL
Cholesterol: 201 mg/dL — ABNORMAL HIGH (ref 0–200)
HDL: 66.3 mg/dL (ref >39.00)
LDL CALC: 120 mg/dL — AB (ref 0–99)
NONHDL: 134.83
Total CHOL/HDL Ratio: 3
Triglycerides: 73 mg/dL (ref 0.0–149.0)
VLDL: 14.6 mg/dL (ref 0.0–40.0)

## 2015-03-25 LAB — CBC WITH DIFFERENTIAL/PLATELET
BASOS ABS: 0 10*3/uL (ref 0.0–0.1)
Basophils Relative: 1.1 % (ref 0.0–3.0)
Eosinophils Absolute: 0.2 10*3/uL (ref 0.0–0.7)
Eosinophils Relative: 7.1 % — ABNORMAL HIGH (ref 0.0–5.0)
HCT: 42.5 % (ref 36.0–46.0)
HEMOGLOBIN: 14.3 g/dL (ref 12.0–15.0)
LYMPHS ABS: 2.1 10*3/uL (ref 0.7–4.0)
Lymphocytes Relative: 62.2 % — ABNORMAL HIGH (ref 12.0–46.0)
MCHC: 33.6 g/dL (ref 30.0–36.0)
MCV: 93.3 fl (ref 78.0–100.0)
MONOS PCT: 10.1 % (ref 3.0–12.0)
Monocytes Absolute: 0.3 10*3/uL (ref 0.1–1.0)
NEUTROS PCT: 19.5 % — AB (ref 43.0–77.0)
Neutro Abs: 0.7 10*3/uL — ABNORMAL LOW (ref 1.4–7.7)
Platelets: 260 10*3/uL (ref 150.0–400.0)
RBC: 4.56 Mil/uL (ref 3.87–5.11)
RDW: 12.8 % (ref 11.5–15.5)
WBC: 3.4 10*3/uL — AB (ref 4.0–10.5)

## 2015-03-25 LAB — HEPATIC FUNCTION PANEL
ALBUMIN: 3.9 g/dL (ref 3.5–5.2)
ALK PHOS: 76 U/L (ref 39–117)
ALT: 30 U/L (ref 0–35)
AST: 24 U/L (ref 0–37)
BILIRUBIN TOTAL: 0.6 mg/dL (ref 0.2–1.2)
Bilirubin, Direct: 0.1 mg/dL (ref 0.0–0.3)
Total Protein: 7.2 g/dL (ref 6.0–8.3)

## 2015-03-25 LAB — POCT URINALYSIS DIPSTICK
Bilirubin, UA: NEGATIVE
Blood, UA: NEGATIVE
Glucose, UA: NEGATIVE
KETONES UA: NEGATIVE
Leukocytes, UA: NEGATIVE
Nitrite, UA: NEGATIVE
PH UA: 7
PROTEIN UA: NEGATIVE
SPEC GRAV UA: 1.015
Urobilinogen, UA: 0.2

## 2015-03-25 LAB — TSH: TSH: 1.35 u[IU]/mL (ref 0.35–4.50)

## 2015-04-01 ENCOUNTER — Encounter: Payer: Self-pay | Admitting: Internal Medicine

## 2015-04-01 ENCOUNTER — Ambulatory Visit (INDEPENDENT_AMBULATORY_CARE_PROVIDER_SITE_OTHER): Payer: 59 | Admitting: Internal Medicine

## 2015-04-01 VITALS — BP 126/89 | HR 88 | Temp 98.3°F | Ht 61.5 in | Wt 119.0 lb

## 2015-04-01 DIAGNOSIS — D869 Sarcoidosis, unspecified: Secondary | ICD-10-CM

## 2015-04-01 DIAGNOSIS — M810 Age-related osteoporosis without current pathological fracture: Secondary | ICD-10-CM

## 2015-04-01 DIAGNOSIS — D860 Sarcoidosis of lung: Secondary | ICD-10-CM

## 2015-04-01 DIAGNOSIS — D72820 Lymphocytosis (symptomatic): Secondary | ICD-10-CM

## 2015-04-01 DIAGNOSIS — D863 Sarcoidosis of skin: Secondary | ICD-10-CM

## 2015-04-01 DIAGNOSIS — Z0000 Encounter for general adult medical examination without abnormal findings: Secondary | ICD-10-CM | POA: Diagnosis not present

## 2015-04-01 NOTE — Patient Instructions (Addendum)
Get dexa scan  Make appt. Try low dose different vit d preps.   Due for pap next year and if  All ok can discontinue .  Repeat blood count in 2-3 months as discussed   If all ok then yearly visit check up.     Health Maintenance, Female Adopting a healthy lifestyle and getting preventive care can go a long way to promote health and wellness. Talk with your health care provider about what schedule of regular examinations is right for you. This is a good chance for you to check in with your provider about disease prevention and staying healthy. In between checkups, there are plenty of things you can do on your own. Experts have done a lot of research about which lifestyle changes and preventive measures are most likely to keep you healthy. Ask your health care provider for more information. WEIGHT AND DIET  Eat a healthy diet  Be sure to include plenty of vegetables, fruits, low-fat dairy products, and lean protein.  Do not eat a lot of foods high in solid fats, added sugars, or salt.  Get regular exercise. This is one of the most important things you can do for your health.  Most adults should exercise for at least 150 minutes each week. The exercise should increase your heart rate and make you sweat (moderate-intensity exercise).  Most adults should also do strengthening exercises at least twice a week. This is in addition to the moderate-intensity exercise.  Maintain a healthy weight  Body mass index (BMI) is a measurement that can be used to identify possible weight problems. It estimates body fat based on height and weight. Your health care provider can help determine your BMI and help you achieve or maintain a healthy weight.  For females 26 years of age and older:   A BMI below 18.5 is considered underweight.  A BMI of 18.5 to 24.9 is normal.  A BMI of 25 to 29.9 is considered overweight.  A BMI of 30 and above is considered obese.  Watch levels of cholesterol and blood  lipids  You should start having your blood tested for lipids and cholesterol at 64 years of age, then have this test every 5 years.  You may need to have your cholesterol levels checked more often if:  Your lipid or cholesterol levels are high.  You are older than 64 years of age.  You are at high risk for heart disease.  CANCER SCREENING   Lung Cancer  Lung cancer screening is recommended for adults 45-64 years old who are at high risk for lung cancer because of a history of smoking.  A yearly low-dose CT scan of the lungs is recommended for people who:  Currently smoke.  Have quit within the past 15 years.  Have at least a 30-pack-year history of smoking. A pack year is smoking an average of one pack of cigarettes a day for 1 year.  Yearly screening should continue until it has been 15 years since you quit.  Yearly screening should stop if you develop a health problem that would prevent you from having lung cancer treatment.  Breast Cancer  Practice breast self-awareness. This means understanding how your breasts normally appear and feel.  It also means doing regular breast self-exams. Let your health care provider know about any changes, no matter how small.  If you are in your 20s or 30s, you should have a clinical breast exam (CBE) by a health care provider every 1-3  years as part of a regular health exam.  If you are 55 or older, have a CBE every year. Also consider having a breast X-ray (mammogram) every year.  If you have a family history of breast cancer, talk to your health care provider about genetic screening.  If you are at high risk for breast cancer, talk to your health care provider about having an MRI and a mammogram every year.  Breast cancer gene (BRCA) assessment is recommended for women who have family members with BRCA-related cancers. BRCA-related cancers include:  Breast.  Ovarian.  Tubal.  Peritoneal cancers.  Results of the assessment  will determine the need for genetic counseling and BRCA1 and BRCA2 testing. Cervical Cancer Your health care provider may recommend that you be screened regularly for cancer of the pelvic organs (ovaries, uterus, and vagina). This screening involves a pelvic examination, including checking for microscopic changes to the surface of your cervix (Pap test). You may be encouraged to have this screening done every 3 years, beginning at age 44.  For women ages 6-65, health care providers may recommend pelvic exams and Pap testing every 3 years, or they may recommend the Pap and pelvic exam, combined with testing for human papilloma virus (HPV), every 5 years. Some types of HPV increase your risk of cervical cancer. Testing for HPV may also be done on women of any age with unclear Pap test results.  Other health care providers may not recommend any screening for nonpregnant women who are considered low risk for pelvic cancer and who do not have symptoms. Ask your health care provider if a screening pelvic exam is right for you.  If you have had past treatment for cervical cancer or a condition that could lead to cancer, you need Pap tests and screening for cancer for at least 20 years after your treatment. If Pap tests have been discontinued, your risk factors (such as having a new sexual partner) need to be reassessed to determine if screening should resume. Some women have medical problems that increase the chance of getting cervical cancer. In these cases, your health care provider may recommend more frequent screening and Pap tests. Colorectal Cancer  This type of cancer can be detected and often prevented.  Routine colorectal cancer screening usually begins at 64 years of age and continues through 64 years of age.  Your health care provider may recommend screening at an earlier age if you have risk factors for colon cancer.  Your health care provider may also recommend using home test kits to check  for hidden blood in the stool.  A small camera at the end of a tube can be used to examine your colon directly (sigmoidoscopy or colonoscopy). This is done to check for the earliest forms of colorectal cancer.  Routine screening usually begins at age 98.  Direct examination of the colon should be repeated every 5-10 years through 64 years of age. However, you may need to be screened more often if early forms of precancerous polyps or small growths are found. Skin Cancer  Check your skin from head to toe regularly.  Tell your health care provider about any new moles or changes in moles, especially if there is a change in a mole's shape or color.  Also tell your health care provider if you have a mole that is larger than the size of a pencil eraser.  Always use sunscreen. Apply sunscreen liberally and repeatedly throughout the day.  Protect yourself by wearing long  sleeves, pants, a wide-brimmed hat, and sunglasses whenever you are outside. HEART DISEASE, DIABETES, AND HIGH BLOOD PRESSURE   High blood pressure causes heart disease and increases the risk of stroke. High blood pressure is more likely to develop in:  People who have blood pressure in the high end of the normal range (130-139/85-89 mm Hg).  People who are overweight or obese.  People who are African American.  If you are 21-95 years of age, have your blood pressure checked every 3-5 years. If you are 59 years of age or older, have your blood pressure checked every year. You should have your blood pressure measured twice--once when you are at a hospital or clinic, and once when you are not at a hospital or clinic. Record the average of the two measurements. To check your blood pressure when you are not at a hospital or clinic, you can use:  An automated blood pressure machine at a pharmacy.  A home blood pressure monitor.  If you are between 64 years and 48 years old, ask your health care provider if you should take  aspirin to prevent strokes.  Have regular diabetes screenings. This involves taking a blood sample to check your fasting blood sugar level.  If you are at a normal weight and have a low risk for diabetes, have this test once every three years after 64 years of age.  If you are overweight and have a high risk for diabetes, consider being tested at a younger age or more often. PREVENTING INFECTION  Hepatitis B  If you have a higher risk for hepatitis B, you should be screened for this virus. You are considered at high risk for hepatitis B if:  You were born in a country where hepatitis B is common. Ask your health care provider which countries are considered high risk.  Your parents were born in a high-risk country, and you have not been immunized against hepatitis B (hepatitis B vaccine).  You have HIV or AIDS.  You use needles to inject street drugs.  You live with someone who has hepatitis B.  You have had sex with someone who has hepatitis B.  You get hemodialysis treatment.  You take certain medicines for conditions, including cancer, organ transplantation, and autoimmune conditions. Hepatitis C  Blood testing is recommended for:  Everyone born from [birth year] through 1965.  Anyone with known risk factors for hepatitis C. Sexually transmitted infections (STIs)  You should be screened for sexually transmitted infections (STIs) including gonorrhea and chlamydia if:  You are sexually active and are younger than 64 years of age.  You are older than 64 years of age and your health care provider tells you that you are at risk for this type of infection.  Your sexual activity has changed since you were last screened and you are at an increased risk for chlamydia or gonorrhea. Ask your health care provider if you are at risk.  If you do not have HIV, but are at risk, it may be recommended that you take a prescription medicine daily to prevent HIV infection. This is called  pre-exposure prophylaxis (PrEP). You are considered at risk if:  You are sexually active and do not regularly use condoms or know the HIV status of your partner(s).  You take drugs by injection.  You are sexually active with a partner who has HIV. Talk with your health care provider about whether you are at high risk of being infected with HIV. If you choose  to begin PrEP, you should first be tested for HIV. You should then be tested every 3 months for as long as you are taking PrEP.  PREGNANCY   If you are premenopausal and you may become pregnant, ask your health care provider about preconception counseling.  If you may become pregnant, take 400 to 800 micrograms (mcg) of folic acid every day.  If you want to prevent pregnancy, talk to your health care provider about birth control (contraception). OSTEOPOROSIS AND MENOPAUSE   Osteoporosis is a disease in which the bones lose minerals and strength with aging. This can result in serious bone fractures. Your risk for osteoporosis can be identified using a bone density scan.  If you are 26 years of age or older, or if you are at risk for osteoporosis and fractures, ask your health care provider if you should be screened.  Ask your health care provider whether you should take a calcium or vitamin D supplement to lower your risk for osteoporosis.  Menopause may have certain physical symptoms and risks.  Hormone replacement therapy may reduce some of these symptoms and risks. Talk to your health care provider about whether hormone replacement therapy is right for you.  HOME CARE INSTRUCTIONS   Schedule regular health, dental, and eye exams.  Stay current with your immunizations.   Do not use any tobacco products including cigarettes, chewing tobacco, or electronic cigarettes.  If you are pregnant, do not drink alcohol.  If you are breastfeeding, limit how much and how often you drink alcohol.  Limit alcohol intake to no more than 1  drink per day for nonpregnant women. One drink equals 12 ounces of beer, 5 ounces of wine, or 1 ounces of hard liquor.  Do not use street drugs.  Do not share needles.  Ask your health care provider for help if you need support or information about quitting drugs.  Tell your health care provider if you often feel depressed.  Tell your health care provider if you have ever been abused or do not feel safe at home.   This information is not intended to replace advice given to you by your health care provider. Make sure you discuss any questions you have with your health care provider.   Document Released: 12/21/2010 Document Revised: 06/28/2014 Document Reviewed: 05/09/2013 [employer] Interactive Patient Education [employer].

## 2015-04-01 NOTE — Progress Notes (Signed)
Pre visit review using our clinic review tool, if applicable. No additional management support is needed unless otherwise documented below in the visit note.  Chief Complaint  Patient presents with  . Annual Exam    HPI: Patient  Courtney Bray  64 y.o. comes in today for Preventive Health Care visit  She has dx of sarcoidosis involving skin and lung hx.  [facility]   [facility] .   And monitoring Humira . Disease is stable dose need yearly ppd check.  Bone health too k high dose vit d got rash  And also other preps  hasnt tried all of them   REG eye checks   Last pap 2017 med records reviewed   Health Maintenance  Topic Date Due  . HEMOGLOBIN A1C  1950/06/24  . Hepatitis C Screening  Nov 02, 1950  . FOOT EXAM  02/18/1961  . OPHTHALMOLOGY EXAM  02/18/1961  . URINE MICROALBUMIN  02/18/1961  . HIV Screening  10/20/2015 (Originally 02/18/1966)  . INFLUENZA VACCINE  03/31/2016 (Originally 01/20/2015)  . PAP SMEAR  09/06/2015  . MAMMOGRAM  10/09/2015  . TETANUS/TDAP  02/19/2017  . COLONOSCOPY  02/15/2019  . ZOSTAVAX  Completed   Health Maintenance Review LIFESTYLE:  Exercise:   Walk every day  Tobacco/ETS: no  Alcohol: limited  Sugar beverages:  No ocass juice  Sleep: 6-7 hours refreshed  Drug use: no Bone density:  2011    No fam hx ?  No fx hip .  fracture pelvis 88   ROS:  GEN/ HEENT: No fever, significant weight changes sweats headaches vision problems hearing changes, CV/ PULM; No chest pain shortness of breath cough, syncope,edema  change in exercise tolerance. GI /GU: No adominal pain, vomiting, change in bowel habits. No blood in the stool. No significant GU symptoms. SKIN/HEME: ,no acute skin rashes suspicious lesions or bleeding. No lymphadenopathy, nodules, masses.  NEURO/ PSYCH:  No neurologic signs such as weakness numbness. No depression anxiety. IMM/ Allergy: No unusual infections.  Allergy .   REST of 12 system review negative except as per HPI   Past  Medical History  Diagnosis Date  . 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  PFT's 2010:  No obstruction, TLC 3.93 (87%), DLCO 89%. PFT's 2014:  No obstruction, TLC 3.07, DLCO 91%    . Chicken pox     Past Surgical History  Procedure Laterality Date  . Tubal ligation  1979  . Uterine artery embolization  2000    Family History  Problem Relation Age of Onset  . Heart disease Father   . Stroke Father   . Dementia Father   . Dementia Mother   . High blood pressure Mother   . High blood pressure Sister     Social History   Social History  . Marital Status: Married    Spouse Name: N/A  . Number of Children: N/A  . Years of Education: N/A   Social History Main Topics  . Smoking status: Never Smoker   . Smokeless tobacco: Never Used  . Alcohol Use: 0.0 oz/week    0 Standard drinks or equivalent per week     Comment: Occasional  . Drug Use: No  . Sexual Activity:    Partners: Male   Other Topics Concern  . None   Social History Narrative   5-6 hours of sleep  per night   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   Orig from [place]   in [place]     Outpatient Prescriptions Prior to Visit  Medication Sig Dispense Refill  . HUMIRA PEN 40 MG/0.8ML PNKT Inject every other week for sarcoidosis    . HYDROcodone-homatropine (HYCODAN) 5-1.5 MG/5ML syrup Take 5 mLs by mouth every 4 (four) hours as needed for cough. 180 mL 0   No facility-administered medications prior to visit.     EXAM:  BP 126/89 mmHg  Pulse 103  Temp(Src) 98.3 F (36.8 C) (Oral)  Ht 5' 1.5" (1.562 m)  Wt 119 lb (53.978 kg)  BMI 22.12 kg/m2  Body mass index is 22.12  kg/(m^2).  Physical Exam: Vital signs reviewed IOM:BTDH is a well-developed well-nourished alert cooperative    who appearsr stated age in no acute distress.  HEENT: normocephalic atraumatic , Eyes: PERRL EOM's full, conjunctiva clear, Nares: paten,t no deformity discharge or tenderness., Ears: no deformity EAC's clear TMs with normal landmarks. Mouth: clear OP, no lesions, edema.  Moist mucous membranes. Dentition in adequate repair. NECK: supple without masses, thyromegaly or bruits. CHEST/PULM:  Clear to auscultation and percussion breath sounds equal no wheeze , rales or rhonchi. No chest wall deformities or tenderness. CV: PMI is nondisplaced, S1 S2 no gallops, murmurs, rubs. Peripheral pulses are full without delay.No JVD . Breast: normal by inspection . No dimpling, discharge, masses, tenderness or discharge .  ABDOMEN: Bowel sounds normal nontender  No guard or rebound, no hepato splenomegal no CVA tenderness. Extremtities:  No clubbing cyanosis or edema, no acute joint swelling or redness no focal atrophy NEURO:  Oriented x3, cranial nerves 3-12 appear to be intact, no obvious focal weakness,gait within normal limits no abnormal reflexes or asymmetrical SKIN: No acute rashes normal turgor, color, no bruising or petechiae. PSYCH: Oriented, good eye contact, no obvious depression anxiety, cognition and judgment appear normal. LN: no cervical axillary inguinal adenopathy  Lab Results  Component Value Date   WBC 3.4* 03/25/2015   HGB 14.3 03/25/2015   HCT 42.5 03/25/2015   PLT 260.0 03/25/2015   GLUCOSE 88 03/25/2015   CHOL 201* 03/25/2015   TRIG 73.0 03/25/2015   HDL 66.30 03/25/2015   LDLCALC 120* 03/25/2015   ALT 30 03/25/2015   AST 24 03/25/2015   NA 140 03/25/2015   K 4.3 03/25/2015   CL 105 03/25/2015   CREATININE 0.64 03/25/2015   BUN 12 03/25/2015   CO2 29 03/25/2015   TSH 1.35 03/25/2015   EKG NSR nl intervals  ASSESSMENT AND PLAN:  Discussed the following  assessment and plan:  Visit for preventive health examination - Plan: EKG 12-Lead  Sarcoidosis, lung ([place]) - Plan: EKG 12-Lead, PPD  Sarcoidosis of skin ([place]) - Plan: EKG 12-Lead, PPD  Lymphocytosis relative  - Plan: EKG 12-Lead  Sarcoidosis ([place]) - Plan: PPD  Osteoporosis - Plan: DG Bone Density Will be getting new pulm docs prev moved from practice  Ppd  Fu blood count  Ongoing? Med related . Disc bone health  Rash from high dose vit d? Per bx hasn't but will   tryother preps such as liquids or sl.  Gummy ,  Patient Care Team: Burnis Medin, MD as PCP - General (Internal Medicine) Kathee Delton, MD as Consulting Physician (Pulmonary Disease) Garry Heater, MD as Referring Physician (Dermatology) Patient Instructions  Get dexa scan  Make appt. Try low dose different vit d preps.   Due for pap next year and if  All ok can discontinue .  Repeat blood count in 2-3 months as discussed   If all ok then yearly visit check up.     Health Maintenance, Female Adopting a healthy lifestyle and getting preventive care can go a long way to promote health and wellness. Talk with your health care provider about what schedule of regular examinations is right for you. This is a good chance for you to check in with your provider about disease prevention and staying healthy. In between checkups, there are plenty of things you can do on your own. Experts have done a lot of research about which lifestyle changes and preventive measures are most likely to keep you healthy. Ask your health care provider for more information. WEIGHT AND DIET  Eat a healthy diet  Be sure to include plenty of vegetables, fruits, low-fat dairy products, and lean protein.  Do not eat a lot of foods high in solid fats, added sugars, or salt.  Get regular exercise. This is one of the most important things you can do for your health.  Most adults should exercise for at least 150 minutes each week. The  exercise should increase your heart rate and make you sweat (moderate-intensity exercise).  Most adults should also do strengthening exercises at least twice a week. This is in addition to the moderate-intensity exercise.  Maintain a healthy weight  Body mass index (BMI) is a measurement that can be used to identify possible weight problems. It estimates body fat based on height and weight. Your health care provider can help determine your BMI and help you achieve or maintain a healthy weight.  For females 3 years of age and older:   A BMI below 18.5 is considered underweight.  A BMI of 18.5 to 24.9 is normal.  A BMI of 25 to 29.9 is considered overweight.  A BMI of 30 and above is considered obese.  Watch levels of cholesterol and blood lipids  You should start having your blood tested for lipids and cholesterol at 64 years of age, then have this test every 5 years.  You may need to have your cholesterol levels checked more often if:  Your lipid or cholesterol levels are high.  You are older than 64 years of age.  You are at high risk for heart disease.  CANCER SCREENING   Lung Cancer  Lung cancer screening is recommended for adults 19-69 years old who are at high risk for lung cancer because of a history of smoking.  A yearly low-dose CT scan of the lungs is recommended for people who:  Currently smoke.  Have quit within the past 15 years.  Have at least a 30-pack-year history of smoking. A pack year is smoking an average of one pack of cigarettes a day for 1 year.  Yearly screening should continue until it has been 15 years since you quit.  Yearly screening should stop if you develop a health problem that would prevent you from having lung cancer treatment.  Breast Cancer  Practice breast self-awareness. This means understanding how your breasts normally appear and feel.  It also means doing regular breast self-exams. Let your health care  provider know about  any changes, no matter how small.  If you are in your 20s or 30s, you should have a clinical breast exam (CBE) by a health care provider every 1-3 years as part of a regular health exam.  If you are 11 or older, have a CBE every year. Also consider having a breast X-ray (mammogram) every year.  If you have a family history of breast cancer, talk to your health care provider about genetic screening.  If you are at high risk for breast cancer, talk to your health care provider about having an MRI and a mammogram every year.  Breast cancer gene (BRCA) assessment is recommended for women who have family members with BRCA-related cancers. BRCA-related cancers include:  Breast.  Ovarian.  Tubal.  Peritoneal cancers.  Results of the assessment will determine the need for genetic counseling and BRCA1 and BRCA2 testing. Cervical Cancer Your health care provider may recommend that you be screened regularly for cancer of the pelvic organs (ovaries, uterus, and vagina). This screening involves a pelvic examination, including checking for microscopic changes to the surface of your cervix (Pap test). You may be encouraged to have this screening done every 3 years, beginning at age 14.  For women ages 59-65, health care providers may recommend pelvic exams and Pap testing every 3 years, or they may recommend the Pap and pelvic exam, combined with testing for human papilloma virus (HPV), every 5 years. Some types of HPV increase your risk of cervical cancer. Testing for HPV may also be done on women of any age with unclear Pap test results.  Other health care providers may not recommend any screening for nonpregnant women who are considered low risk for pelvic cancer and who do not have symptoms. Ask your health care provider if a screening pelvic exam is right for you.  If you have had past treatment for cervical cancer or a condition that could lead to cancer, you need Pap tests and screening for  cancer for at least 20 years after your treatment. If Pap tests have been discontinued, your risk factors (such as having a new sexual partner) need to be reassessed to determine if screening should resume. Some women have medical problems that increase the chance of getting cervical cancer. In these cases, your health care provider may recommend more frequent screening and Pap tests. Colorectal Cancer  This type of cancer can be detected and often prevented.  Routine colorectal cancer screening usually begins at 64 years of age and continues through 64 years of age.  Your health care provider may recommend screening at an earlier age if you have risk factors for colon cancer.  Your health care provider may also recommend using home test kits to check for hidden blood in the stool.  A small camera at the end of a tube can be used to examine your colon directly (sigmoidoscopy or colonoscopy). This is done to check for the earliest forms of colorectal cancer.  Routine screening usually begins at age 34.  Direct examination of the colon should be repeated every 5-10 years through 64 years of age. However, you may need to be screened more often if early forms of precancerous polyps or small growths are found. Skin Cancer  Check your skin from head to toe regularly.  Tell your health care provider about any new moles or changes in moles, especially if there is a change in a mole's shape or color.  Also tell your health care provider  if you have a mole that is larger than the size of a pencil eraser.  Always use sunscreen. Apply sunscreen liberally and repeatedly throughout the day.  Protect yourself by wearing long sleeves, pants, a wide-brimmed hat, and sunglasses whenever you are outside. HEART DISEASE, DIABETES, AND HIGH BLOOD PRESSURE   High blood pressure causes heart disease and increases the risk of stroke. High blood pressure is more likely to develop in:  People who have blood  pressure in the high end of the normal range (130-139/85-89 mm Hg).  People who are overweight or obese.  People who are African American.  If you are 82-27 years of age, have your blood pressure checked every 3-5 years. If you are 36 years of age or older, have your blood pressure checked every year. You should have your blood pressure measured twice--once when you are at a hospital or clinic, and once when you are not at a hospital or clinic. Record the average of the two measurements. To check your blood pressure when you are not at a hospital or clinic, you can use:  An automated blood pressure machine at a pharmacy.  A home blood pressure monitor.  If you are between 76 years and 74 years old, ask your health care provider if you should take aspirin to prevent strokes.  Have regular diabetes screenings. This involves taking a blood sample to check your fasting blood sugar level.  If you are at a normal weight and have a low risk for diabetes, have this test once every three years after 64 years of age.  If you are overweight and have a high risk for diabetes, consider being tested at a younger age or more often. PREVENTING INFECTION  Hepatitis B  If you have a higher risk for hepatitis B, you should be screened for this virus. You are considered at high risk for hepatitis B if:  You were born in a country where hepatitis B is common. Ask your health care provider which countries are considered high risk.  Your parents were born in a high-risk country, and you have not been immunized against hepatitis B (hepatitis B vaccine).  You have HIV or AIDS.  You use needles to inject street drugs.  You live with someone who has hepatitis B.  You have had sex with someone who has hepatitis B.  You get hemodialysis treatment.  You take certain medicines for conditions, including cancer, organ transplantation, and autoimmune conditions. Hepatitis C  Blood testing is recommended  for:  Everyone born from [birth year] through 1965.  Anyone with known risk factors for hepatitis C. Sexually transmitted infections (STIs)  You should be screened for sexually transmitted infections (STIs) including gonorrhea and chlamydia if:  You are sexually active and are younger than 64 years of age.  You are older than 64 years of age and your health care provider tells you that you are at risk for this type of infection.  Your sexual activity has changed since you were last screened and you are at an increased risk for chlamydia or gonorrhea. Ask your health care provider if you are at risk.  If you do not have HIV, but are at risk, it may be recommended that you take a prescription medicine daily to prevent HIV infection. This is called pre-exposure prophylaxis (PrEP). You are considered at risk if:  You are sexually active and do not regularly use condoms or know the HIV status of your partner(s).  You take drugs by  injection.  You are sexually active with a partner who has HIV. Talk with your health care provider about whether you are at high risk of being infected with HIV. If you choose to begin PrEP, you should first be tested for HIV. You should then be tested every 3 months for as long as you are taking PrEP.  PREGNANCY   If you are premenopausal and you may become pregnant, ask your health care provider about preconception counseling.  If you may become pregnant, take 400 to 800 micrograms (mcg) of folic acid every day.  If you want to prevent pregnancy, talk to your health care provider about birth control (contraception). OSTEOPOROSIS AND MENOPAUSE   Osteoporosis is a disease in which the bones lose minerals and strength with aging. This can result in serious bone fractures. Your risk for osteoporosis can be identified using a bone density scan.  If you are 69 years of age or older, or if you are at risk for osteoporosis and fractures, ask your health care provider if you  should be screened.  Ask your health care provider whether you should take a calcium or vitamin D supplement to lower your risk for osteoporosis.  Menopause may have certain physical symptoms and risks.  Hormone replacement therapy may reduce some of these symptoms and risks. Talk to your health care provider about whether hormone replacement therapy is right for you.  HOME CARE INSTRUCTIONS   Schedule regular health, dental, and eye exams.  Stay current with your immunizations.   Do not use any tobacco products including cigarettes, chewing tobacco, or electronic cigarettes.  If you are pregnant, do not drink alcohol.  If you are breastfeeding, limit how much and how often you drink alcohol.  Limit alcohol intake to no more than 1 drink per day for nonpregnant women. One drink equals 12 ounces of beer, 5 ounces of wine, or 1 ounces of hard liquor.  Do not use street drugs.  Do not share needles.  Ask your health care provider for help if you need support or information about quitting drugs.  Tell your health care provider if you often feel depressed.  Tell your health care provider if you have ever been abused or do not feel safe at home.   This information is not intended to replace advice given to you by your health care provider. Make sure you discuss any questions you have with your health care provider.   Document Released: 12/21/2010 Document Revised: 06/28/2014 Document Reviewed: 05/09/2013 Elsevier Interactive Patient Education 2016 Star K. Panosh M.D.

## 2015-04-03 LAB — TB SKIN TEST
INDURATION: 0 mm
TB Skin Test: NEGATIVE

## 2015-04-05 ENCOUNTER — Encounter: Payer: Self-pay | Admitting: Internal Medicine

## 2015-04-24 ENCOUNTER — Ambulatory Visit: Payer: BC Managed Care – PPO | Admitting: Pulmonary Disease

## 2015-05-20 ENCOUNTER — Other Ambulatory Visit: Payer: Self-pay | Admitting: Family Medicine

## 2015-05-20 ENCOUNTER — Telehealth: Payer: Self-pay | Admitting: Internal Medicine

## 2015-05-20 DIAGNOSIS — D869 Sarcoidosis, unspecified: Secondary | ICD-10-CM

## 2015-05-20 NOTE — Telephone Encounter (Signed)
Need to know why the pt sees derm.

## 2015-05-20 NOTE — Telephone Encounter (Signed)
Order placed in the system. 

## 2015-05-20 NOTE — Telephone Encounter (Signed)
sarcadosis

## 2015-05-20 NOTE — Telephone Encounter (Signed)
[name] called saying she needs to make an appointment with her Dermatologist but per her insurance, a referral has to be sent in order for the appointment to be covered. [facility] on [address] [place] and sees Dr. Marylou FlesherWilliam Huang. When she called that location, they told her we have to call them directly in order to set up an appt for Courtney Bray. Please give Courtney Bray a call to inform her of the appt day and time.   Pt ph# [phone] Thank you.

## 2015-05-23 ENCOUNTER — Inpatient Hospital Stay: Admission: RE | Admit: 2015-05-23 | Payer: 59 | Source: Ambulatory Visit

## 2015-05-27 ENCOUNTER — Ambulatory Visit (INDEPENDENT_AMBULATORY_CARE_PROVIDER_SITE_OTHER)
Admission: RE | Admit: 2015-05-27 | Discharge: 2015-05-27 | Disposition: A | Discharge status: Home / Self Care | Payer: 59 | Source: Ambulatory Visit | Attending: Internal Medicine | Admitting: Internal Medicine

## 2015-05-27 DIAGNOSIS — M810 Age-related osteoporosis without current pathological fracture: Secondary | ICD-10-CM

## 2015-05-29 ENCOUNTER — Encounter: Payer: Self-pay | Admitting: Pulmonary Disease

## 2015-05-29 ENCOUNTER — Ambulatory Visit (INDEPENDENT_AMBULATORY_CARE_PROVIDER_SITE_OTHER): Payer: 59 | Admitting: Pulmonary Disease

## 2015-05-29 ENCOUNTER — Ambulatory Visit (INDEPENDENT_AMBULATORY_CARE_PROVIDER_SITE_OTHER)
Admission: RE | Admit: 2015-05-29 | Discharge: 2015-05-29 | Disposition: A | Discharge status: Home / Self Care | Payer: 59 | Source: Ambulatory Visit | Attending: Pulmonary Disease | Admitting: Pulmonary Disease

## 2015-05-29 ENCOUNTER — Other Ambulatory Visit: Payer: 59

## 2015-05-29 VITALS — BP 130/70 | HR 82 | Temp 98.1°F | Ht 61.5 in | Wt 116.6 lb

## 2015-05-29 DIAGNOSIS — D869 Sarcoidosis, unspecified: Secondary | ICD-10-CM | POA: Diagnosis not present

## 2015-05-29 NOTE — Patient Instructions (Signed)
Steward DroneBrenda-- it was a pleasure meeting you today...  Today we checked your follow up CXR and a sarcoid blood test called an ACE level (angiotensin converting enzyme)...    We will contact you w/ the results when available...   Stay as active as possible...  Call for any questions or if we can be of service in any way...  Let's plan a follow up visit in [id number].

## 2015-05-30 ENCOUNTER — Encounter: Payer: Self-pay | Admitting: Pulmonary Disease

## 2015-05-30 LAB — ANGIOTENSIN CONVERTING ENZYME: Angiotensin-Converting Enzyme: 42 U/L (ref 8–52)

## 2015-05-30 NOTE — Progress Notes (Signed)
Subjective:     Patient ID: Courtney Bray, female   DOB: [birth year], 64 y.o.   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    TBBx RLL by DrClance 1994 showed noncaseating granulomatous inflamm c/w sarcoidosis...  CT Chest scans from 2002=>2004 showed bilat hilar & mediastinal adenopathy w/ interstitial lung changes of her known sarcoidosis; bilat upper lobe nodules R>L are unchanged serially likely representing sarcoidosis as well...   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  PFTs 03/15/06 showed FVC=2.59 (88%), FEV1=1.98 (89%), %1sec=76, mid-flows reduced at 61% predicted; TLC=3.45 (76%), RV=0.86 (53%), RV/TLC=25%; DLCO=101% predicted...  CXRs 2008=>2011 showed bilat hilar prom & stable RUL opac w/ scarring & retraction of the hilum...  PFTs 09/25/08 showed FVC=2.56 (88%), FEV1=2.06 (95%), %1sec=81, mid-flows wnl at 83% predicted; TLC=3.93 (87%), RV=1.26 (76%), RV/TLC=32%,  DLCO=89% predicted...    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 This primarily manifest as cough, and also skin involvement. She is being followed closely at  [facility] for her cutaneous sarcoid, and is being managed on Humira at this point. She has done very well with the medication, and it has also helped control her cough. The patient is very satisfied with her exertional tolerance at this time, and denies any issues with shortness of breath. As stated above, her cough has not been a problem since being on the Humira. She has not had a recent chest x-ray or pulmonary function studies.      IMP>  The patient is doing very well from a pulmonary standpoint. She is having no significant cough or dyspnea on exertion. I suspect the Humira is helping her pulmonary disease as well as her skin disease. She does need to have a followup chest x-ray and pulmonary function studies, and we will schedule these today (not done). I have asked her to stay as active as possible, and to call as if she sees any change in her breathing. She will followup with me in one year.      REC>  Will check cxr today, and call you with results. Please schedule followup breathing studies at your convenience over the next 1-2 mos. Will call you with results.  Followup with me in one year if doing well  CXR 08/31/11> IMP: History of sarcoidosis. Hilar prominence right greater than left may reflect adenopathy. Appear stable. Stable appearance of slight prominence of reticular interstitial markings. No new lesion is evident.   ~  03/15/13:  ROV w/ KC>        Patient comes in today for followup of her known sarcoidosis. This primarily manifests as skin  changes, as well as cough. She is being treated with Humira by her dermatologist, and this has also controlled her cough very well. She reports no shortness of breath, and has been staying very active. Her chest x-ray last year was totally stable, and her breathing test today are stable as well.      IMP>  The patient is doing very well from a sarcoid standpoint, with no increasing shortness of breath or decreased exertional  tolerance. Her x-ray has been stable, and although her total lung capacity it is a little lower, her diffusion capacity is completely normal and stable. Her cough and skin changes are totally controlled on Humira through her dermatologist. Overall, she is satisfied with her symptoms, and I have encouraged her to continue on an aggressive exercise and conditioning program. I would like to see her back in one year, but do not feel that she needs yearly x-rays and PFTs. Will simply do these periodically and as needed.      REC>  Your breathing studies are stable.Good news.Will check xrays and PFT's periodically rather than yearly. Stay as active as possible. Make sure you get your flu shot this fall. Follow up with me in one year, but please call if having increased symptoms.  PFT 03/15/13>  FVC=2.36 (103%), FEV1=1.94 (108%), %1sec=82, mid-flows wnl at 116% predicted;  TLC=3.07 (66%), RV=1.18 (62%), RV/TLC=39; DLCO=91% predicted...    ~  04/22/14:  ROV w/ KC>        The patient comes in today for follow-up of her known sarcoidosis. This is primarily manifesting as skin involvement, and the patient is on Humira through her dermatologist with significant control of pulmonary symptoms and skin changes. She denies any cough, and has had excellent exertional tolerance. She feels that she is asymptomatic from a pulmonary standpoint.      IMP>  The patient is doing very well from a pulmonary standpoint with respect to her sarcoidosis. She is continuing treatment by dermatology for her skin changes, and this in turn has resolved her pulmonary symptoms as well. I would like to see her back in one year to check on things, but she is to call if she has increased symptoms. I will check a chest x-ray at her next visit.      REC>  Continue on your medication thru your dermatologist. Please call if you feel you are having increased pulmonary symptoms. Followup with me in one year, and will check a chest xray the same  day.    ~  May 29, 2015:  1year ROV w/ SN>  Her PCP= DrPanosh => last seen 10/16 & they checked PPD skin test= neg (but control not used and some sarcoid pts are anergic).    Today I met Courtney Bray for the first time, reviewed her history, and composed this EPIC chart review... She notes min cough, no sput, denies SOB/ CP, no palpit or edema;  she is active- exercises, walking, does stairs ok, and not limited in any way...     Pulmonary sarcoidosis> Dx 1994 via TBBx showing noncaseating granulomatous inflammation; CT Chest scans showed bilat hilar & mediastinal adenopathy w/ interstitial lung changes w/ bilat upper lobe nodules R>L; PFTs were +/- wnl (poss mild restriction) and minimal pulmonary symptomatology;  She has never required treatment for her pulm dis and ACE levels were not followed...    Cutaneous sarcoidosis> Dx on scalp Bx 2006 & currently followed at [facility] on Humira and doing well on this med... EXAM  shows Afeb, VSS, O2sat=97%;  HEENT- neg, mallampati1;  Chest- clear w/o w/r/r;  Heart- RR Gr1/6 SEM w/o r/g;  Abd- soft, neg;  Ext- neg w/o c/c/e;  Neuro- intact;  Derm- wig covers bald spot on scalp, prev plaques resolved.  CXR 05/29/15 showed norm heart size, stable coarse interstitial markings w/ R>L hilar enlargement & right perihilar opac & vol loss; no active infiltrate or edema; no change from 2013 films...  LABS 10/16 by DrPanosh showed FLP- ok;  Chems- wnl including LFTs;  CBC- wnl x WBC=3.4;  TSH=1.35  LABS 05/29/15:  ACE level = 42 (8-52) indicative of inactive sarcoid... IMP/PLAN>>  Abeni remains essentially asymptomatic from the pulmonary standpoint; her CXR is stable and ACE level is wnl at 42; she remains on Humira for cutaneous sarcoid under the direction of [facility]; we discussed ROV in 18mo    Past Medical History  Diagnosis Date  . [place]    Dxed 1994 by TBBX + ISLD/LN on cxr Primarily manifests as cough and skin changes [facility]  PFT's 2010:  No obstruction, TLC 3.93 (87%), DLCO 89%. PFT's 2014:  No obstruction, TLC 3.07, DLCO 91%    . Chicken pox     Past Surgical History  Procedure Laterality Date  . Tubal ligation  1979  . Uterine artery embolization  2000    Outpatient Encounter Prescriptions as of 05/29/2015  Medication Sig  . cholecalciferol (VITAMIN D) 400 UNITS TABS tablet Take 400 Units by mouth daily.  .Marland KitchenHUMIRA PEN 40 MG/0.8ML PNKT Inject every other week for sarcoidosis  . Omega-3 Fatty Acids (FISH OIL) 1200 MG CAPS Take 2 capsules by mouth as needed.    No facility-administered encounter medications on file as of 05/29/2015.    Allergies  Allergen Reactions  . Vitamin D Analogs Rash    high dose  Pill had rash     Immunization History  Administered Date(s) Administered  . PPD Test 04/01/2015  . Pneumococcal Polysaccharide-23 09/05/2012  . Tdap 02/20/2007  . Zoster 04/22/2011    Current Medications, Allergies, Past Medical History, Past Surgical History, Family History, and Social History were reviewed in [employer].   Review of Systems             All symptoms NEG except where BOLDED >>  Constitutional:  F/C/S, fatigue, anorexia, unexpected weight change. HEENT:  HA, visual changes, hearing loss, earache, nasal symptoms, sore throat, mouth sores, hoarseness. Resp:  cough, sputum, hemoptysis; SOB, tightness, wheezing. Cardio:  CP, palpit, DOE, orthopnea, edema. GI:  N/V/D/C, blood in stool; reflux, abd pain, distention, gas. GU:  dysuria, freq, urgency, hematuria, flank pain, voiding difficulty. MS:  joint pain, swelling, tenderness, decr ROM; neck pain, back pain, etc. Neuro:  HA, tremors, seizures, dizziness, syncope, weakness, numbness, gait abn. Skin:  suspicious lesions or skin rash, scalp plaques have improved on Rx. Heme:  adenopathy, bruising, bleeding. Psyche:  confusion, agitation, sleep disturbance, hallucinations, anxiety,  depression suicidal.   Objective:   Physical Exam       Vital Signs:  Reviewed...  General:  WD, WN, 64y/o F in NAD; alert & oriented; pleasant & cooperative... HEENT:  [place]/AT; Conjunctiva- pink, Sclera- nonicteric, EOM-wnl, PERRLA, Fundi-benign; EACs-clear, TMs-wnl; NOSE-clear; THROAT-clear & wnl. Neck:  Supple w/ fair ROM; no JVD; normal carotid impulses w/o bruits; no thyromegaly or nodules palpated; no lymphadenopathy palpated. Chest:  Clear to P & A; without wheezes, rales, or rhonchi  heard. Heart:  Regular Rhythm; norm S1 & S2 Gr1/6 SEM w/o rubs or gallops detected. Abdomen:  Soft & nontender- no guarding or rebound; normal bowel sounds; no organomegaly or masses palpated. Ext:  Normal ROM; without deformities or arthritic changes; no varicose veins, venous insuffic, or edema;  Pulses intact w/o bruits. Neuro:  CNs II-XII intact; motor testing normal; sensory testing normal; gait normal & balance OK. Derm:  No lesions noted; no rash etc; she is wearing a wig that covers her bald spot. Lymph:  No cervical, supraclavicular, axillary, or inguinal adenopathy palpated.   Assessment:      IMP >>     Pulmonary sarcoidosis>  She remains essentially asymptomatic, CXR is stable, ACE level is wnl;  We will continue to follow...    Cutaneous sarcoidosis>  Followed by Kennieth Rad on Humira and improved, no new skin lesons...    Medical issues:  Followed by DrPanosh.  PLAN >>     Danilyn remains essentially asymptomatic from the pulmonary standpoint; her CXR is stable and ACE level is wnl at 42; she remains on Humira for cutaneous sarcoid under the direction of [place]; we discussed ROV in 25mo     Plan:     Patient's Medications  New Prescriptions   No medications on file  Previous Medications   CHOLECALCIFEROL (VITAMIN D) 400 UNITS TABS TABLET    Take 400 Units by mouth daily.   HUMIRA PEN 40 MG/0.8ML PNKT    Inject every other week for sarcoidosis   OMEGA-3 FATTY ACIDS (FISH OIL) 1200  MG CAPS    Take 2 capsules by mouth as needed.   Modified Medications   No medications on file  Discontinued Medications   No medications on file

## 2015-06-30 ENCOUNTER — Ambulatory Visit: Payer: 59 | Admitting: Internal Medicine

## 2015-07-02 ENCOUNTER — Other Ambulatory Visit: Payer: BLUE CROSS/BLUE SHIELD

## 2015-07-02 ENCOUNTER — Other Ambulatory Visit (INDEPENDENT_AMBULATORY_CARE_PROVIDER_SITE_OTHER): Payer: BLUE CROSS/BLUE SHIELD

## 2015-07-02 DIAGNOSIS — D649 Anemia, unspecified: Secondary | ICD-10-CM | POA: Diagnosis not present

## 2015-07-02 DIAGNOSIS — D611 Drug-induced aplastic anemia: Secondary | ICD-10-CM

## 2015-07-02 LAB — CBC WITH DIFFERENTIAL/PLATELET
BASOS PCT: 1.3 % (ref 0.0–3.0)
Basophils Absolute: 0.1 10*3/uL (ref 0.0–0.1)
EOS PCT: 7.6 % — AB (ref 0.0–5.0)
Eosinophils Absolute: 0.3 10*3/uL (ref 0.0–0.7)
HCT: 42.2 % (ref 36.0–46.0)
HEMOGLOBIN: 14.1 g/dL (ref 12.0–15.0)
LYMPHS ABS: 2.2 10*3/uL (ref 0.7–4.0)
Lymphocytes Relative: 56.9 % — ABNORMAL HIGH (ref 12.0–46.0)
MCHC: 33.4 g/dL (ref 30.0–36.0)
MCV: 93.6 fl (ref 78.0–100.0)
MONO ABS: 0.5 10*3/uL (ref 0.1–1.0)
Monocytes Relative: 11.9 % (ref 3.0–12.0)
NEUTROS PCT: 22.3 % — AB (ref 43.0–77.0)
Neutro Abs: 0.8 10*3/uL — ABNORMAL LOW (ref 1.4–7.7)
Platelets: 288 10*3/uL (ref 150.0–400.0)
RBC: 4.51 Mil/uL (ref 3.87–5.11)
RDW: 13 % (ref 11.5–15.5)
WBC: 3.8 10*3/uL — ABNORMAL LOW (ref 4.0–10.5)

## 2015-07-03 LAB — PATHOLOGIST SMEAR REVIEW

## 2015-07-09 ENCOUNTER — Encounter: Payer: Self-pay | Admitting: Internal Medicine

## 2015-07-09 ENCOUNTER — Ambulatory Visit (INDEPENDENT_AMBULATORY_CARE_PROVIDER_SITE_OTHER): Payer: BLUE CROSS/BLUE SHIELD | Admitting: Internal Medicine

## 2015-07-09 VITALS — BP 128/80 | Temp 98.3°F | Wt 116.0 lb

## 2015-07-09 DIAGNOSIS — M810 Age-related osteoporosis without current pathological fracture: Secondary | ICD-10-CM

## 2015-07-09 DIAGNOSIS — D72820 Lymphocytosis (symptomatic): Secondary | ICD-10-CM

## 2015-07-09 DIAGNOSIS — D709 Neutropenia, unspecified: Secondary | ICD-10-CM

## 2015-07-09 NOTE — Patient Instructions (Signed)
Take 800 IU vit d per day . Continue lifestyle intervention healthy eating and exercise .  Plan on  Repeating bone density in  2 years . Consider  Specialty  consult for osteoporosis   Intervention  At that time.   You will be contacted about  Hematology consult. ? If Humira causing the low neurophil count.  Self  obtain for orevious  Blood counts if possible for review.   Yearly check up  Otherwise or as needed

## 2015-07-09 NOTE — Assessment & Plan Note (Signed)
dexa basically same   Different instrument  See text   Vit d 800 exercise wants to  Repeat in 2 years and assess at that time .

## 2015-07-09 NOTE — Progress Notes (Signed)
Pre visit review using our clinic review tool, if applicable. No additional management support is needed unless otherwise documented below in the visit note.  Chief Complaint  Patient presents with  . Follow-up    bone density and blood count     HPI: Courtney Bray  comes in today for follow up of   dexa and low wbc labs.  Doing well no change in health. Has seen Dr Kriste Basque and pulm sarcoid  is stable nnl ace x ray etc to see yearly . Had dexa  And copy of last in 2011   -3.7 and    -2.5 (6 different machines )Using baby drops bvit d 400 and tolerating  And weight bearing .  No hx of  Fracture   fragility or other hesistant to use bisphosphonate's cause of reported se .   On humira for a while  No recent cbcdiff  Noted but may have some from  [place]  FP  No bleeding fevers infections  ROS: See pertinent positives and negatives per HPI.  Past Medical History  Diagnosis Date  . Sarcoidosis (HCC) 02/24/2007    Dxed 1994 by TBBX + ISLD/LN on cxr Primarily manifests as cough and skin changes Tx with Humira by derm at [facility].  PFT's 2010:  No obstruction, TLC 3.93 (87%), DLCO 89%. PFT's 2014:  No obstruction, TLC 3.07, DLCO 91%    . Chicken pox     Family History  Problem Relation Age of Onset  . Heart disease Father   . Stroke Father   . Dementia Father   . Dementia Mother   . High blood pressure Mother   . High blood pressure Sister     with hypertesnive renal disease    Social History   Social History  . Marital Status: Married    Spouse Name: N/A  . Number of Children: N/A  . Years of Education: N/A   Social History Main Topics  . Smoking status: Never Smoker   . Smokeless tobacco: Never Used  . Alcohol Use: 0.0 oz/week    0 Standard drinks or equivalent per week     Comment: Occasional  . Drug Use: No  . Sexual Activity:    Partners: Male   Other Topics Concern  . None   Social History Narrative   5-6 hours of sleep per night   Lives with her husband  who is retired Engineer, 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   Orig from [place]   in [place] since 1988   Courtney Bray     Outpatient Prescriptions Prior to Visit  Medication Sig Dispense Refill  . cholecalciferol (VITAMIN D) 400 UNITS TABS tablet Take 400 Units by mouth daily.    Marland Kitchen HUMIRA PEN 40 MG/0.8ML PNKT Inject every other week for sarcoidosis    . Omega-3 Fatty Acids (FISH OIL) 1200 MG CAPS Take 2 capsules by mouth as needed.      No facility-administered medications prior to visit.     EXAM:  BP 128/80 mmHg  Temp(Src) 98.3 F (36.8 C) (Oral)  Wt 116 lb (52.617 kg)  Body mass index is 21.57 kg/(m^2).  GENERAL: vitals reviewed and listed above, alert, oriented, appears well hydrated and in no acute distress PSYCH: pleasant and  cooperative, no obvious depression or anxiety Results:  Lumbar spine (L1-L4) Femoral neck (FN)  T-score - 3.8 RFN: - 2.6 LFN: - 2.6    reviewed lab and dexa reports   ASSESSMENT AND PLAN:  Discussed the following assessment and plan:  Lymphocytosis relative   Neutropenia, unspecified type (HCC) - ? med induced or not  pt seems well get hem opinion pt will try to get previous cbcs  Osteoporosis - stable  dexa  concern abou not taking bisphosphoates at thtis time  will inc to 800 vit d and exercise and recjheck in 2 years . Leukopenia   ? If could be related to her humira  dont have a baseline but because of the absolute neutropenia  Get hematology opinion about cause and   Appropriate follow up.   Shared Decision Making  Want to  Repeat in 2 years dexa consider  Endocrine or other opinon at that time  Inc to 800 iu vit d in interim  ised to return or notify health care team  if  new concerns arise. isgns infection  Patient Instructions  Take 800 IU vit d per day . Continue lifestyle  intervention healthy eating and exercise .  Plan on  Repeating bone density in  2 years . Consider  Specialty  consult for osteoporosis   Intervention  At that time.   You will be contacted about  Hematology consult. ? If Humira causing the low neurophil count.  Self  obtain for orevious  Blood counts if possible for review.   Yearly check up  Otherwise or as needed      Neta Mends. Marshelle Bilger M.D.

## 2015-07-18 ENCOUNTER — Telehealth: Payer: Self-pay | Admitting: Oncology

## 2015-07-18 NOTE — Telephone Encounter (Signed)
Lt mess for pt regarding new pt referral  °

## 2015-07-29 ENCOUNTER — Telehealth: Payer: Self-pay | Admitting: Oncology

## 2015-07-29 NOTE — Telephone Encounter (Signed)
Pt returned call for new pt referral. Confirmed appt for 07/31/15.

## 2015-07-31 ENCOUNTER — Telehealth: Payer: Self-pay | Admitting: Oncology

## 2015-07-31 ENCOUNTER — Ambulatory Visit (HOSPITAL_BASED_OUTPATIENT_CLINIC_OR_DEPARTMENT_OTHER): Payer: BLUE CROSS/BLUE SHIELD | Admitting: Oncology

## 2015-07-31 VITALS — BP 135/79 | HR 84 | Temp 97.8°F | Resp 16 | Ht 61.5 in | Wt 117.5 lb

## 2015-07-31 DIAGNOSIS — D72820 Lymphocytosis (symptomatic): Secondary | ICD-10-CM | POA: Diagnosis not present

## 2015-07-31 DIAGNOSIS — D709 Neutropenia, unspecified: Secondary | ICD-10-CM

## 2015-07-31 DIAGNOSIS — D869 Sarcoidosis, unspecified: Secondary | ICD-10-CM | POA: Diagnosis not present

## 2015-07-31 NOTE — Consult Note (Signed)
Reason for Referral: Lymphocytosis and neutropenia.   HPI: Pleasant 65 year old woman currently of [place] where she had been living for many years after moving from [place]. 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 She was diagnosed 20 years ago and currently receiving Humira for the last 6 years. She has established care with Dr. Regis Bill recently after changing her primary care provider. A CBC obtained on 07/02/2015 showed white cell count to be 3.8 with a normal hemoglobin and platelet. Her neutrophil percentage was low at 22.3 and her lymphocytes was high at 56.9. Her eosinophil percentage was up at 7.6. Her absolute neutrophil count was 800. Her peripheral smear showed no immature cells and very little atypical lymphocytes. Otherwise normal peripheral smear. A previous CBC done on October 2060 showed total white cell count 3.4 and lymphocyte percentage was 62% and neutrophils were 19.5%. Her absolute neutrophil count at that time was 700. Based on these findings patient was referred to me for evaluation. Clinically she has no complaints. She feels well and had not reported any fevers or chills or sweats or weight loss. Has not reported any lymphadenopathy or abdominal distention. Has not reported any decline in her performance status or activity level. She remains active without any decline. She reports that she had been told about abnormalities in her white cell count before with her previous primary care provider and she does have old records of CBC which she will bring next time.  She does not report any headaches, blurry vision, syncope or seizures. She does not report any night sweats or decline in her ability to perform activities of daily living. Appetite remain excellent and she exercises regularly. She does not report any chest pain, palpitation, orthopnea or leg edema. She does not report any cough, wheezing or hemoptysis. Does not report any  nausea, vomiting, abdominal pain, hematochezia, melena. Does not report any frequency, urgency or hesitancy. Does not report any skeletal complaints of arthralgias, myalgias. Remaining review of systems unremarkable.   Past Medical History  Diagnosis Date  . 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  PFT's 2010:  No obstruction, TLC 3.93 (87%), DLCO 89%. PFT's 2014:  No obstruction, TLC 3.07, DLCO 91%    . Chicken pox   :  Past Surgical History  Procedure Laterality Date  . Tubal ligation  1979  . Uterine artery embolization  2000  :   Current outpatient prescriptions:  .  cholecalciferol (VITAMIN D) 400 UNITS TABS tablet, Take 400 Units by mouth daily., Disp: , Rfl:  .  HUMIRA PEN 40 MG/0.8ML PNKT, Inject every other week for sarcoidosis, Disp: , Rfl:  .  Omega-3 Fatty Acids (FISH OIL) 1200 MG CAPS, Take 2 capsules by mouth as needed. , Disp: , Rfl: :  Allergies  Allergen Reactions  . Vitamin D Analogs Rash    high dose  Pill had rash   :  Family History  Problem Relation Age of Onset  . Heart disease Father   . Stroke Father   . Dementia Father   . Dementia Mother   . High blood pressure Mother   . High blood pressure Sister     with hypertesnive renal disease  :  Social History   Social History  . Marital Status: Married    Spouse Name: N/A  . Number  of Children: N/A  . Years of Education: N/A   Occupational History  . Not on file.   Social History Main Topics  . Smoking status: Never Smoker   . Smokeless tobacco: Never Used  . Alcohol Use: 0.0 oz/week    0 Standard drinks or equivalent per week     Comment: Occasional  . Drug Use: No  . Sexual Activity:    Partners: Male   Other Topics Concern  . 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   Orig from [place]   in [place]   :  Pertinent items are noted in HPI.  Exam: ECOG 0 Blood pressure 135/79, pulse 84, temperature 97.8 F (36.6 C), temperature source Oral, resp. rate 16, height 5' 1.5" (1.562 m), weight 117 lb 8 oz (53.298 kg), SpO2 99 %. General appearance: alert and cooperative well-appearing without distress. Head: Normocephalic, without obvious abnormality Throat: lips, mucosa, and tongue normal; teeth and gums normal no oral thrush. Neck: no adenopathy Back: negative Resp: clear to auscultation bilaterally Chest wall: no tenderness Cardio: regular rate and rhythm, S1, S2 normal, no murmur, click, rub or gallop GI: soft, non-tender; bowel sounds normal; no masses,  no organomegaly or splenomegaly. Extremities: extremities normal, atraumatic, no cyanosis or edema Pulses: 2+ and symmetric Skin: Skin color, texture, turgor normal. No rashes or lesions Lymph nodes: Cervical, supraclavicular, and axillary nodes normal.  CBC    Component Value Date/Time   WBC 3.8* 07/02/2015 0837   RBC 4.51 07/02/2015 0837   HGB 14.1 07/02/2015 0837   HCT 42.2 07/02/2015 0837   PLT 288.0 07/02/2015 0837   MCV 93.6 07/02/2015 0837   MCHC 33.4 07/02/2015 0837   RDW 13.0 07/02/2015 0837   LYMPHSABS 2.2 07/02/2015 0837   MONOABS 0.5 07/02/2015 0837   EOSABS 0.3 07/02/2015 0837   BASOSABS 0.1 07/02/2015 0837      Chemistry      Component Value Date/Time   NA 140 03/25/2015 0831   K 4.3 03/25/2015 0831   CL 105 03/25/2015 0831   CO2 29 03/25/2015 0831   BUN 12 03/25/2015 0831   CREATININE 0.64 03/25/2015 0831      Component Value Date/Time   CALCIUM 9.3 03/25/2015 0831   ALKPHOS 76 03/25/2015 0831   AST 24 03/25/2015 0831   ALT 30 03/25/2015 0831   BILITOT 0.6 03/25/2015 0831        Assessment and Plan:   65 year old woman with the following issues:  1. 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 This was noted on a CBC on 07/02/2015 and consistent with similar finding in October 2016. Her absolute neutrophil count have actually improved slightly from 700 to 800 and that interval. She is completely asymptomatic with these findings without any recurrent infections.  The differential diagnosis was discussed today with the patient extensively. It is very possible that these findings are related to her sarcoid diagnosis could also be related to Humira. This medication have been described to cause neutropenia as well as a lymphocytosis as well as eosinophilia. This also recommendation of a lymphoproliferative disorder  caused by this medication but this is a rare event.  Other consideration would be de novo lymphoproliferative disorder such as CLL, mantle cell lymphoma, B-cell lymphocytosis among others. I think these are less likely but certainly a possibility.  From a management standpoint, I have recommended a repeat her CBC in about 3 months and obtain a peripheral blood flow cytometry. If these findings are suspicious for lymphoproliferative disorder, we'll consider a CT scan and a possible bone marrow biopsy. I will also asked her to obtain older records for comparative purposes to get an idea of the chronicity of these findings.  2. Infectious prophylaxis: She does have adequate neutrophil count at this time but I advised her to avoid certain situations that will increase your infectious risks. He is include contact with sick individuals as well as eating undercooked food among others.  3. Sarcoidosis: Could certainly be contributing to her white cell count abnormalities so is Humira. If Humira thought to be the culprit, we'll have a discussion about alternative therapy although this medication have helped her tremendously. She feels that her symptoms have  improved dramatically including scalp lesions as well as pulmonary symptoms. It would be reasonable to continue for the time being.  4. Follow-up: Will be in 3 months to repeat laboratory testing and reevaluation.

## 2015-07-31 NOTE — Progress Notes (Signed)
Please see consult note.  

## 2015-07-31 NOTE — Telephone Encounter (Signed)
per pof to sch pt appt-gave pt copy of avs °

## 2015-10-27 ENCOUNTER — Telehealth: Payer: Self-pay | Admitting: Oncology

## 2015-10-27 NOTE — Telephone Encounter (Signed)
returned call and s.w. pt and r/s appts....pt ok and aware of new d.t

## 2015-10-28 ENCOUNTER — Other Ambulatory Visit: Payer: BLUE CROSS/BLUE SHIELD

## 2015-11-04 ENCOUNTER — Ambulatory Visit: Payer: BLUE CROSS/BLUE SHIELD | Admitting: Oncology

## 2015-11-10 ENCOUNTER — Other Ambulatory Visit (HOSPITAL_COMMUNITY)
Admission: RE | Admit: 2015-11-10 | Discharge: 2015-11-10 | Disposition: A | Discharge status: Home / Self Care | Payer: BLUE CROSS/BLUE SHIELD | Source: Ambulatory Visit | Attending: Oncology | Admitting: Oncology

## 2015-11-10 ENCOUNTER — Other Ambulatory Visit (HOSPITAL_BASED_OUTPATIENT_CLINIC_OR_DEPARTMENT_OTHER): Payer: BLUE CROSS/BLUE SHIELD

## 2015-11-10 DIAGNOSIS — D72820 Lymphocytosis (symptomatic): Secondary | ICD-10-CM | POA: Insufficient documentation

## 2015-11-10 LAB — CBC WITH DIFFERENTIAL/PLATELET
BASO%: 1.5 % (ref 0.0–2.0)
Basophils Absolute: 0.1 10*3/uL (ref 0.0–0.1)
EOS%: 8.3 % — AB (ref 0.0–7.0)
Eosinophils Absolute: 0.3 10*3/uL (ref 0.0–0.5)
HCT: 42.6 % (ref 34.8–46.6)
HGB: 15 g/dL (ref 11.6–15.9)
LYMPH%: 59.9 % — AB (ref 14.0–49.7)
MCH: 32.3 pg (ref 25.1–34.0)
MCHC: 35.2 g/dL (ref 31.5–36.0)
MCV: 91.8 fL (ref 79.5–101.0)
MONO#: 0.3 10*3/uL (ref 0.1–0.9)
MONO%: 10.1 % (ref 0.0–14.0)
NEUT#: 0.7 10*3/uL — ABNORMAL LOW (ref 1.5–6.5)
NEUT%: 20.2 % — AB (ref 38.4–76.8)
Platelets: 227 10*3/uL (ref 145–400)
RBC: 4.64 10*6/uL (ref 3.70–5.45)
RDW: 12.5 % (ref 11.2–14.5)
WBC: 3.3 10*3/uL — ABNORMAL LOW (ref 3.9–10.3)
lymph#: 2 10*3/uL (ref 0.9–3.3)
nRBC: 0 % (ref 0–0)

## 2015-11-10 LAB — COMPREHENSIVE METABOLIC PANEL
ALT: 34 U/L (ref 0–55)
ANION GAP: 9 meq/L (ref 3–11)
AST: 27 U/L (ref 5–34)
Albumin: 3.6 g/dL (ref 3.5–5.0)
Alkaline Phosphatase: 75 U/L (ref 40–150)
BUN: 13.6 mg/dL (ref 7.0–26.0)
CO2: 25 meq/L (ref 22–29)
CREATININE: 0.8 mg/dL (ref 0.6–1.1)
Calcium: 9.1 mg/dL (ref 8.4–10.4)
Chloride: 106 mEq/L (ref 98–109)
EGFR: >90 mL/min/{1.73_m2} (ref >90)
GLUCOSE: 97 mg/dL (ref 70–140)
Potassium: 3.9 mEq/L (ref 3.5–5.1)
SODIUM: 140 meq/L (ref 136–145)
TOTAL PROTEIN: 7.5 g/dL (ref 6.4–8.3)
Total Bilirubin: 0.48 mg/dL (ref 0.20–1.20)

## 2015-11-10 LAB — CHCC SMEAR

## 2015-11-10 LAB — TECHNOLOGIST REVIEW

## 2015-11-11 LAB — FLOW CYTOMETRY

## 2015-11-19 ENCOUNTER — Ambulatory Visit (HOSPITAL_BASED_OUTPATIENT_CLINIC_OR_DEPARTMENT_OTHER): Payer: BLUE CROSS/BLUE SHIELD | Admitting: Oncology

## 2015-11-19 VITALS — BP 132/80 | HR 89 | Temp 98.2°F | Resp 20 | Ht 61.5 in | Wt 118.9 lb

## 2015-11-19 DIAGNOSIS — D709 Neutropenia, unspecified: Secondary | ICD-10-CM

## 2015-11-19 DIAGNOSIS — D72820 Lymphocytosis (symptomatic): Secondary | ICD-10-CM | POA: Diagnosis not present

## 2015-11-19 NOTE — Progress Notes (Signed)
Hematology and Oncology Follow Up Visit  Courtney Bray [id number] 17-Apr-1951 65 y.o. 11/19/2015 9:17 AM Lorretta Harp, MDPanosh, Neta Mends, MD   Principle Diagnosis: 65 year old woman with mild lymphocytosis and neutropenia dating back to 2008. Etiology is likely reactive without any evidence of lymphoproliferative disorder.   Current therapy: Observation and surveillance.  Interim History: Courtney Bray presents today for a follow-up visit. She is a pleasant woman I saw in consultation back on 07/31/2015. Since the last visit, she reports no major changes in her health. She continues to be fairly active and performs activities of daily living. She denied any recurrent infections or hospitalizations. She denied any fevers or chills or sweats. She denied any constitutional symptoms.   She does not report any headaches, blurry vision, syncope or seizures. She does not report any night sweats or decline in her ability to perform activities of daily living. Appetite remain excellent and she exercises regularly. She does not report any chest pain, palpitation, orthopnea or leg edema. She does not report any cough, wheezing or hemoptysis. Does not report any nausea, vomiting, abdominal pain, hematochezia, melena. Does not report any frequency, urgency or hesitancy. Does not report any skeletal complaints of arthralgias, myalgias. Remaining review of systems unremarkable.   Medications: I have reviewed the patient's current medications.  Current Outpatient Prescriptions  Medication Sig Dispense Refill  . cholecalciferol (VITAMIN D) 400 UNITS TABS tablet Take 400 Units by mouth daily.    Marland Kitchen HUMIRA PEN 40 MG/0.8ML PNKT Inject every other week for sarcoidosis    . Omega-3 Fatty Acids (FISH OIL) 1200 MG CAPS Take 2 capsules by mouth as needed.      No current facility-administered medications for this visit.     Allergies:  Allergies  Allergen Reactions  . Vitamin D Analogs Rash    high dose   Pill had rash     Past Medical History, Surgical history, Social history, and Family History were reviewed and updated.  Physical Exam: Blood pressure 132/80, pulse 89, temperature 98.2 F (36.8 C), temperature source Oral, resp. rate 20, height 5' 1.5" (1.562 m), weight 118 lb 14.4 oz (53.933 kg), SpO2 100 %. ECOG: 0 General appearance: alert and cooperative Head: Normocephalic, without obvious abnormality Neck: no adenopathy Lymph nodes: Cervical, supraclavicular, and axillary nodes normal. Heart:regular rate and rhythm, S1, S2 normal, no murmur, click, rub or gallop Lung:chest clear, no wheezing, rales, normal symmetric air entry Abdomin: soft, non-tender, without masses or organomegaly EXT:no erythema, induration, or nodules   Lab Results: Lab Results  Component Value Date   WBC 3.3* 11/10/2015   HGB 15.0 11/10/2015   HCT 42.6 11/10/2015   MCV 91.8 11/10/2015   PLT 227 11/10/2015     Chemistry      Component Value Date/Time   NA 140 11/10/2015 0815   NA 140 03/25/2015 0831   K 3.9 11/10/2015 0815   K 4.3 03/25/2015 0831   CL 105 03/25/2015 0831   CO2 25 11/10/2015 0815   CO2 29 03/25/2015 0831   BUN 13.6 11/10/2015 0815   BUN 12 03/25/2015 0831   CREATININE 0.8 11/10/2015 0815   CREATININE 0.64 03/25/2015 0831      Component Value Date/Time   CALCIUM 9.1 11/10/2015 0815   CALCIUM 9.3 03/25/2015 0831   ALKPHOS 75 11/10/2015 0815   ALKPHOS 76 03/25/2015 0831   AST 27 11/10/2015 0815   AST 24 03/25/2015 0831   ALT 34 11/10/2015 0815   ALT 30 03/25/2015 0831  BILITOT 0.48 11/10/2015 0815   BILITOT 0.6 03/25/2015 0831      Impression and Plan:  65 year old woman with the following issues:  1. Leukocytopenia with mild lymphocytosis. She had CBCs dating back to 2008 which I personally reviewed today. Her white cell count till have ranged between 3.1-4.1 with mild lymphocytosis dating back since that time. Her CBC from 11/10/2015 was reviewed which showed  findings consistent with her a sliding. Her flow cytometry did not reveal any evidence to suggest lymphoproliferative disorder.  These findings likely represent reactive lymphocytosis and not a sign of a lymphoproliferative disorder. I have recommended periodic monitoring with an annual CBC as she is doing. She develops other cytopenias such as anemia or thrombocytopenia or any other constitutional symptoms then further evaluation might be needed. In all likelihood we're dealing with benign findings.  2. Follow-up: Will be as needed.   [facility]ADAD,Tytan Sandate, MD 5/31/20179:17 AM

## 2016-03-02 DIAGNOSIS — H2513 Age-related nuclear cataract, bilateral: Secondary | ICD-10-CM | POA: Diagnosis not present

## 2016-03-26 ENCOUNTER — Other Ambulatory Visit (INDEPENDENT_AMBULATORY_CARE_PROVIDER_SITE_OTHER): Payer: PPO

## 2016-03-26 ENCOUNTER — Encounter: Payer: Self-pay | Admitting: Internal Medicine

## 2016-03-26 DIAGNOSIS — Z0000 Encounter for general adult medical examination without abnormal findings: Secondary | ICD-10-CM

## 2016-03-26 DIAGNOSIS — D72820 Lymphocytosis (symptomatic): Secondary | ICD-10-CM | POA: Insufficient documentation

## 2016-03-26 LAB — CBC WITH DIFFERENTIAL/PLATELET
BASOS PCT: 0.8 % (ref 0.0–3.0)
Basophils Absolute: 0 10*3/uL (ref 0.0–0.1)
Eosinophils Absolute: 0.7 10*3/uL (ref 0.0–0.7)
HEMATOCRIT: 42.3 % (ref 36.0–46.0)
HEMOGLOBIN: 14.5 g/dL (ref 12.0–15.0)
Lymphs Abs: 2.6 10*3/uL (ref 0.7–4.0)
MCHC: 34.4 g/dL (ref 30.0–36.0)
MCV: 92.5 fl (ref 78.0–100.0)
MONOS PCT: 9.9 % (ref 3.0–12.0)
Monocytes Absolute: 0.4 10*3/uL (ref 0.1–1.0)
Neutro Abs: 0.8 10*3/uL — ABNORMAL LOW (ref 1.4–7.7)
Neutrophils Relative %: 17.4 % — ABNORMAL LOW (ref 43.0–77.0)
Platelets: 298 10*3/uL (ref 150.0–400.0)
RBC: 4.57 Mil/uL (ref 3.87–5.11)
RDW: 12.9 % (ref 11.5–15.5)
WBC: 4.5 10*3/uL (ref 4.0–10.5)

## 2016-03-26 LAB — HEPATIC FUNCTION PANEL
ALT: 25 U/L (ref 0–35)
AST: 22 U/L (ref 0–37)
Albumin: 3.6 g/dL (ref 3.5–5.2)
Alkaline Phosphatase: 78 U/L (ref 39–117)
BILIRUBIN TOTAL: 0.5 mg/dL (ref 0.2–1.2)
Bilirubin, Direct: 0 mg/dL (ref 0.0–0.3)
TOTAL PROTEIN: 7.4 g/dL (ref 6.0–8.3)

## 2016-03-26 LAB — BASIC METABOLIC PANEL
BUN: 12 mg/dL (ref 6–23)
CHLORIDE: 104 meq/L (ref 96–112)
CO2: 29 mEq/L (ref 19–32)
Calcium: 9.2 mg/dL (ref 8.4–10.5)
Creatinine, Ser: 0.72 mg/dL (ref 0.40–1.20)
GFR: 104.52 mL/min (ref >60.00)
Glucose, Bld: 91 mg/dL (ref 70–99)
POTASSIUM: 3.9 meq/L (ref 3.5–5.1)
SODIUM: 139 meq/L (ref 135–145)

## 2016-03-26 LAB — LIPID PANEL
CHOLESTEROL: 196 mg/dL (ref 0–200)
HDL: 63.4 mg/dL (ref >39.00)
LDL CALC: 118 mg/dL — AB (ref 0–99)
NonHDL: 132.12
TRIGLYCERIDES: 71 mg/dL (ref 0.0–149.0)
Total CHOL/HDL Ratio: 3
VLDL: 14.2 mg/dL (ref 0.0–40.0)

## 2016-03-26 LAB — TSH: TSH: 1.53 u[IU]/mL (ref 0.35–4.50)

## 2016-04-02 ENCOUNTER — Ambulatory Visit (INDEPENDENT_AMBULATORY_CARE_PROVIDER_SITE_OTHER): Payer: PPO | Admitting: Internal Medicine

## 2016-04-02 ENCOUNTER — Encounter: Payer: Self-pay | Admitting: Internal Medicine

## 2016-04-02 VITALS — BP 122/80 | Temp 98.1°F | Ht 61.25 in | Wt 118.6 lb

## 2016-04-02 DIAGNOSIS — D863 Sarcoidosis of skin: Secondary | ICD-10-CM | POA: Diagnosis not present

## 2016-04-02 DIAGNOSIS — Z23 Encounter for immunization: Secondary | ICD-10-CM | POA: Diagnosis not present

## 2016-04-02 DIAGNOSIS — M810 Age-related osteoporosis without current pathological fracture: Secondary | ICD-10-CM

## 2016-04-02 DIAGNOSIS — Z79899 Other long term (current) drug therapy: Secondary | ICD-10-CM

## 2016-04-02 DIAGNOSIS — D709 Neutropenia, unspecified: Secondary | ICD-10-CM

## 2016-04-02 DIAGNOSIS — Z0000 Encounter for general adult medical examination without abnormal findings: Secondary | ICD-10-CM

## 2016-04-02 NOTE — Patient Instructions (Addendum)
Glad  You are doing well  Please  Increase vit d in diet .  There may be other rx for osteoporosis  Risk   meds  fosamax  prolia  Get dexa next year   12 18 ...2 years from last one  And can also  consider seeding endocrinologist about bone health fracture prevention. Get flu vaccine   Can get  hiv and hep c screen at next blood tests. If ask   Low risk      Bone Health Bones protect organs, store calcium, and anchor muscles. Good health habits, such as eating nutritious foods and exercising regularly, are important for maintaining healthy bones. They can also help to prevent a condition that causes bones to lose density and become weak and brittle (osteoporosis). WHY IS BONE MASS IMPORTANT? Bone mass refers to the amount of bone tissue that you have. The higher your bone mass, the stronger your bones. An important step toward having healthy bones throughout life is to have strong and dense bones during childhood. A young adult who has a high bone mass is more likely to have a high bone mass later in life. Bone mass at its greatest it is called peak bone mass. A large decline in bone mass occurs in older adults. In women, it occurs about the time of menopause. During this time, it is important to practice good health habits, because if more bone is lost than what is replaced, the bones will become less healthy and more likely to break (fracture). If you find that you have a low bone mass, you may be able to prevent osteoporosis or further bone loss by changing your diet and lifestyle. HOW CAN I FIND OUT IF MY BONE MASS IS LOW? Bone mass can be measured with an X-ray test that is called a bone mineral density (BMD) test. This test is recommended for all women who are age 357 or older. It may also be recommended for men who are age 67 or older, or for people who are more likely to develop osteoporosis due to:  Having bones that break easily.  Having a long-term disease that weakens bones, such as  kidney disease or rheumatoid arthritis.  Having menopause earlier than normal.  Taking medicine that weakens bones, such as steroids, thyroid hormones, or hormone treatment for breast cancer or prostate cancer.  Smoking.  Drinking three or more alcoholic drinks each day. WHAT ARE THE NUTRITIONAL RECOMMENDATIONS FOR HEALTHY BONES? To have healthy bones, you need to get enough of the right minerals and vitamins. Most nutrition experts recommend getting these nutrients from the foods that you eat. Nutritional recommendations vary from person to person. Ask your health care provider what is healthy for you. Here are some general guidelines. Calcium Recommendations Calcium is the most important (essential) mineral for bone health. Most people can get enough calcium from their diet, but supplements may be recommended for people who are at risk for osteoporosis. Good sources of calcium include:  Dairy products, such as low-fat or nonfat milk, cheese, and yogurt.  Dark green leafy vegetables, such as bok choy and broccoli.  Calcium-fortified foods, such as orange juice, cereal, bread, soy beverages, and tofu products.  Nuts, such as almonds. Follow these recommended amounts for daily calcium intake:  Children, age 35-3: 700 mg.  Children, age 57-8: 1,000 mg.  Children, age 66-13: 1,300 mg.  Teens, age 51-18: 1,300 mg.  Adults, age 75-50: 1,000 mg.  Adults, age 65-70:  Men: 1,000 mg.  Women: 1,200 mg.  Adults, age 65 or older: 1,200 mg.  Pregnant and breastfeeding females:  Teens: 1,300 mg.  Adults: 1,000 mg. Vitamin D Recommendations Vitamin D is the most essential vitamin for bone health. It helps the body to absorb calcium. Sunlight stimulates the skin to make vitamin D, so be sure to get enough sunlight. If you live in a cold climate or you do not get outside often, your health care provider may recommend that you take vitamin D supplements. Good sources of vitamin D in your  diet include:  Egg yolks.  Saltwater fish.  Milk and cereal fortified with vitamin D. Follow these recommended amounts for daily vitamin D intake:  Children and teens, age 27-18: 600 international units.  Adults, age 65 or younger: 400-800 international units.  Adults, age 65 or older: 800-1,000 international units. Other Nutrients Other nutrients for bone health include:  Phosphorus. This mineral is found in meat, poultry, dairy foods, nuts, and legumes. The recommended daily intake for adult men and adult women is 700 mg.  Magnesium. This mineral is found in seeds, nuts, dark green vegetables, and legumes. The recommended daily intake for adult men is 400-420 mg. For adult women, it is 310-320 mg.  Vitamin K. This vitamin is found in green leafy vegetables. The recommended daily intake is 120 mg for adult men and 90 mg for adult women. WHAT TYPE OF PHYSICAL ACTIVITY IS BEST FOR BUILDING AND MAINTAINING HEALTHY BONES? Weight-bearing and strength-building activities are important for building and maintaining peak bone mass. Weight-bearing activities cause muscles and bones to work against gravity. Strength-building activities increases muscle strength that supports bones. Weight-bearing and muscle-building activities include:  Walking and hiking.  Jogging and running.  Dancing.  Gym exercises.  Lifting weights.  Tennis and racquetball.  Climbing stairs.  Aerobics. Adults should get at least 30 minutes of moderate physical activity on most days. Children should get at least 60 minutes of moderate physical activity on most days. Ask your health care provide what type of exercise is best for you. WHERE CAN I FIND MORE INFORMATION? For more information, check out the following websites:  National Osteoporosis Foundation: http://burton-owens.org/http://nof.org/learn/basics  [employer] Institutes of Health: http://www.niams.http://www.johnson-fowler.biz/nih.gov/Health_Info/Bone/Bone_Health/bone_health_for_life.asp   This  information is not intended to replace advice given to you by your health care provider. Make sure you discuss any questions you have with your health care provider.   Document Released: 08/28/2003 Document Revised: 10/22/2014 Document Reviewed: 06/12/2014 Elsevier Interactive Patient Education 2016 [employer] Inc[employer]   Osteoporosis Osteoporosis is the thinning and loss of density in the bones. Osteoporosis makes the bones more brittle, fragile, and likely to break (fracture). Over time, osteoporosis can cause the bones to become so weak that they fracture after a simple fall. The bones most likely to fracture are the bones in the hip, wrist, and spine. CAUSES  The exact cause is not known. RISK FACTORS Anyone can develop osteoporosis. You may be at greater risk if you have a family history of the condition or have poor nutrition. You may also have a higher risk if you are:   Female.   65 years old or older.  A smoker.  Not physically active.   White or Asian.  Slender. SIGNS AND SYMPTOMS  A fracture might be the first sign of the disease, especially if it results from a fall or injury that would not usually cause a bone to break. Other signs and symptoms include:   Low back and neck pain.  Stooped  posture.  Height loss. DIAGNOSIS  To make a diagnosis, your health care provider may:  Take a medical history.  Perform a physical exam.  Order tests, such as:  A bone mineral density test.  A dual-energy X-ray absorptiometry test. TREATMENT  The goal of osteoporosis treatment is to strengthen your bones to reduce your risk of a fracture. Treatment may involve:  Making lifestyle changes, such as:  Eating a diet rich in calcium.  Doing weight-bearing and muscle-strengthening exercises.  Stopping tobacco use.  Limiting alcohol intake.  Taking medicine to slow the process of bone loss or to increase bone density.  Monitoring your levels of calcium and vitamin D. HOME  CARE INSTRUCTIONS  Include calcium and vitamin D in your diet. Calcium is important for bone health, and vitamin D helps the body absorb calcium.  Perform weight-bearing and muscle-strengthening exercises as directed by your health care provider.  Do not use any tobacco products, including cigarettes, chewing tobacco, and electronic cigarettes. If you need help quitting, ask your health care provider.  Limit your alcohol intake.  Take medicines only as directed by your health care provider.  Keep all follow-up visits as directed by your health care provider. This is important.  Take precautions at home to lower your risk of falling, such as:  Keeping rooms well lit and clutter free.  Installing safety rails on stairs.  Using rubber mats in the bathroom and other areas that are often wet or slippery. SEEK IMMEDIATE MEDICAL CARE IF:  You fall or injure yourself.    This information is not intended to replace advice given to you by your health care provider. Make sure you discuss any questions you have with your health care provider.   Document Released: 03/17/2005 Document Revised: 06/28/2014 Document Reviewed: 11/15/2013 Elsevier Interactive Patient Education [employer]

## 2016-04-02 NOTE — Progress Notes (Signed)
Chief Complaint  Patient presents with  . Medicare Wellness    HPI: Patient  Courtney Bray  65 y.o. comes [facility]  She is generally well is on Humira for skin sarcoid about every 2 weeks which is also helped cough she had for many years. Seems to tolerate it well. Here for a wellness exam. She states her blood pressures generally good. She's had low bone density no fracture but cannot tolerate vitamin D get side effects with different preparations. She is not diabetic despite the electronic record seemingly trying to label her such. Health Maintenance  Topic Date Due  . HEMOGLOBIN A1C  July 19, 1950  . FOOT EXAM  02/18/1961  . OPHTHALMOLOGY EXAM  02/18/1961  . URINE MICROALBUMIN  02/18/1961  . PAP SMEAR  09/06/2015  . MAMMOGRAM  10/09/2015  . INFLUENZA VACCINE  11/18/2016 (Originally 01/20/2016)  . Hepatitis C Screening  04/01/2017 (Originally 1950/10/06)  . HIV Screening  04/01/2017 (Originally 02/18/1966)  . TETANUS/TDAP  02/19/2017  . PNA vac Low Risk Adult (2 of 2 - PPSV23) 09/05/2017  . COLONOSCOPY  02/15/2019  . DEXA SCAN  Completed  . ZOSTAVAX  Completed   Health Maintenance Review Lifestyle: Household 2 exercises walking negative tobacco occasional alcohol sleep 6 hours no sugars pet dog, care takes mom doesn't work outside the home at this time. No falling except tripped on a rug with no major danger Medicare questionnaire document sent to scan reviewed.  ROS:  GEN/ HEENT: No fever, significant weight changes sweats headaches vision problems hearing changes, CV/ PULM; No chest pain shortness of breath cough, syncope,edema  change in exercise tolerance. GI /GU: No adominal pain, vomiting, change in bowel habits. No blood in the stool. No significant GU symptoms. SKIN/HEME: ,no acute skin rashes suspicious lesions or bleeding. No lymphadenopathy, nodules, masses.  NEURO/ PSYCH:  No neurologic signs such as weakness  numbness. No depression anxiety. IMM/ Allergy: No unusual infections.  Allergy .   REST of 12 system review negative except as per HPI   Past Medical History:  Diagnosis Date  . Chicken pox   . 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  PFT's 2010:  No obstruction, TLC 3.93 (87%), DLCO 89%. PFT's 2014:  No obstruction, TLC 3.07, DLCO 91%      Past Surgical History:  Procedure Laterality Date  . TUBAL LIGATION  1979  . UTERINE ARTERY EMBOLIZATION  2000    Family History  Problem Relation Age of Onset  . Heart disease Father   . Stroke Father   . Dementia Father   . Dementia Mother   . High blood pressure Mother   . High blood pressure Sister     with hypertesnive renal disease    Social History   Social History  . Marital status: Married    Spouse name: N/A  . Number of children: N/A  . Years of education: N/A   Social History Main Topics  . Smoking status: Never Smoker  . Smokeless tobacco: Never Used  . Alcohol use 0.0 oz/week     Comment: Occasional  . Drug use: No  . Sexual activity: Yes    Partners: Male   Other Topics Concern  . None   Social History Narrative   5-6 hours of sleep per night   Lives with her husband who  is retired Engineer, 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   Orig from [place]   in [place] since 1988   [name]     Outpatient Medications Prior to Visit  Medication Sig Dispense Refill  . HUMIRA PEN 40 MG/0.8ML PNKT Inject every other week for sarcoidosis    . Omega-3 Fatty Acids (FISH OIL) 1200 MG CAPS Take 2 capsules by mouth as needed.     . cholecalciferol (VITAMIN D) 400 UNITS TABS tablet Take 400 Units by mouth daily.     No facility-administered medications  prior to visit.      EXAM:  BP 122/80 (BP Location: Left Arm, Patient Position: Sitting, Cuff Size: Normal)   Temp 98.1 F (36.7 C) (Oral)   Ht 5' 1.25" (1.556 m)   Wt 118 lb 9.6 oz (53.8 kg)   BMI 22.23 kg/m   Body mass index is 22.23 kg/m.  Physical Exam: Vital signs reviewed ZOX:WRUE is a well-developed well-nourished alert cooperative    who appearsr stated age in no acute distress.  HEENT: normocephalic atraumatic , Eyes: PERRL EOM's full, conjunctiva clear, Nares: paten,t no deformity discharge or tenderness., Ears: no deformity EAC's clear TMs with normal landmarks. Mouth: clear OP, no lesions, edema.  Moist mucous membranes. Dentition in adequate repair. NECK: supple without masses, thyromegaly or bruits. CHEST/PULM:  Clear to auscultation and percussion breath sounds equal no wheeze , rales or rhonchi. No chest wall deformities or tenderness. CV: PMI is nondisplaced, S1 S2 no gallops, murmurs, rubs. Peripheral pulses are full without delay.No JVD . Breast no nodules or discharge inverted nipples bilaterally not new. ABDOMEN: Bowel sounds normal nontender  No guard or rebound, no hepato splenomegal no CVA tenderness.  No hernia. Extremtities:  No clubbing cyanosis or edema, no acute joint swelling or redness no focal atrophy NEURO:  Oriented x3, cranial nerves 3-12 appear to be intact, no obvious focal weakness,gait within normal limits no abnormal reflexes or asymmetrical SKIN: No acute rashes normal turgor, color, no bruising or petechiae. PSYCH: Oriented, good eye contact, no obvious depression anxiety, cognition and judgment appear normal. LN: no cervical axillary inguinal adenopathy  Lab Results  Component Value Date   WBC 4.5 03/26/2016   HGB 14.5 03/26/2016   HCT 42.3 03/26/2016   PLT 298.0 03/26/2016   GLUCOSE 91 03/26/2016   CHOL 196 03/26/2016   TRIG 71.0 03/26/2016   HDL 63.40 03/26/2016   LDLCALC 118 (H) 03/26/2016   ALT 25 03/26/2016   AST 22 03/26/2016    NA 139 03/26/2016   K 3.9 03/26/2016   CL 104 03/26/2016   CREATININE 0.72 03/26/2016   BUN 12 03/26/2016   CO2 29 03/26/2016   TSH 1.53 03/26/2016    ASSESSMENT AND PLAN:  Discussed the following assessment and plan:  Visit for preventive health examination  Sarcoidosis of skin (HCC)  Neutropenia, unspecified type (HCC) - Stable previously evaluated  High risk medication use  Osteoporosis without current pathological fracture, unspecified osteoporosis type - C DEXA scan repeat 12th 2018 consider  medications. As appropriate.  Need for prophylactic vaccination and inoculation against influenza  Need for vaccination with 13-polyvalent pneumococcal conjugate vaccine - Plan: Pneumococcal conjugate vaccine 13-valent -2.6  Patient Care Team: Madelin Headings, MD  as PCP - General (Internal Medicine) Barbaraann Share, MD as Consulting Physician (Pulmonary Disease) Kandice Hams, MD as Referring Physician (Dermatology) Michele Mcalpine, MD as Consulting Physician (Pulmonary Disease) Patient Instructions  Titus Dubin  You are doing well  Please  Increase vit d in diet .  There may be other rx for osteoporosis  Risk   meds  fosamax  prolia  Get dexa next year   12 18 ...2 years from last one  And can also  consider seeding endocrinologist about bone health fracture prevention. Get flu vaccine   Can get  hiv and hep c screen at next blood tests. If ask   Low risk      Bone Health Bones protect organs, store calcium, and anchor muscles. Good health habits, such as eating nutritious foods and exercising regularly, are important for maintaining healthy bones. They can also help to prevent a condition that causes bones to lose density and become weak and brittle (osteoporosis). WHY IS BONE MASS IMPORTANT? Bone mass refers to the amount of bone tissue that you have. The higher your bone mass, the stronger your bones. An important step toward having healthy bones throughout life is to have  strong and dense bones during childhood. A young adult who has a high bone mass is more likely to have a high bone mass later in life. Bone mass at its greatest it is called peak bone mass. A large decline in bone mass occurs in older adults. In women, it occurs about the time of menopause. During this time, it is important to practice good health habits, because if more bone is lost than what is replaced, the bones will become less healthy and more likely to break (fracture). If you find that you have a low bone mass, you may be able to prevent osteoporosis or further bone loss by changing your diet and lifestyle. HOW CAN I FIND OUT IF MY BONE MASS IS LOW? Bone mass can be measured with an X-ray test that is called a bone mineral density (BMD) test. This test is recommended for all women who are age 68 or older. It may also be recommended for men who are age 41 or older, or for people who are more likely to develop osteoporosis due to:  Having bones that break easily.  Having a long-term disease that weakens bones, such as kidney disease or rheumatoid arthritis.  Having menopause earlier than normal.  Taking medicine that weakens bones, such as steroids, thyroid hormones, or hormone treatment for breast cancer or prostate cancer.  Smoking.  Drinking three or more alcoholic drinks each day. WHAT ARE THE NUTRITIONAL RECOMMENDATIONS FOR HEALTHY BONES? To have healthy bones, you need to get enough of the right minerals and vitamins. Most nutrition experts recommend getting these nutrients from the foods that you eat. Nutritional recommendations vary from person to person. Ask your health care provider what is healthy for you. Here are some general guidelines. Calcium Recommendations Calcium is the most important (essential) mineral for bone health. Most people can get enough calcium from their diet, but supplements may be recommended for people who are at risk for osteoporosis. Good sources of  calcium include:  Dairy products, such as low-fat or nonfat milk, cheese, and yogurt.  Dark green leafy vegetables, such as bok choy and broccoli.  Calcium-fortified foods, such as orange juice, cereal, bread, soy beverages, and tofu products.  Nuts, such as almonds. Follow these recommended amounts for daily calcium intake:  Children,  age 77-3: 700 mg.  Children, age 87-8: 1,000 mg.  Children, age 559-13: 1,300 mg.  Teens, age 87814-18: 1,300 mg.  Adults, age 65-50: 1,000 mg.  Adults, age 65-70:  Men: 1,000 mg.  Women: 1,200 mg.  Adults, age 65 or older: 1,200 mg.  Pregnant and breastfeeding females:  Teens: 1,300 mg.  Adults: 1,000 mg. Vitamin D Recommendations Vitamin D is the most essential vitamin for bone health. It helps the body to absorb calcium. Sunlight stimulates the skin to make vitamin D, so be sure to get enough sunlight. If you live in a cold climate or you do not get outside often, your health care provider may recommend that you take vitamin D supplements. Good sources of vitamin D in your diet include:  Egg yolks.  Saltwater fish.  Milk and cereal fortified with vitamin D. Follow these recommended amounts for daily vitamin D intake:  Children and teens, age 77-18: 600 international units.  Adults, age 87750 or younger: 400-800 international units.  Adults, age 65 or older: 800-1,000 international units. Other Nutrients Other nutrients for bone health include:  Phosphorus. This mineral is found in meat, poultry, dairy foods, nuts, and legumes. The recommended daily intake for adult men and adult women is 700 mg.  Magnesium. This mineral is found in seeds, nuts, dark green vegetables, and legumes. The recommended daily intake for adult men is 400-420 mg. For adult women, it is 310-320 mg.  Vitamin K. This vitamin is found in green leafy vegetables. The recommended daily intake is 120 mg for adult men and 90 mg for adult women. WHAT TYPE OF PHYSICAL  ACTIVITY IS BEST FOR BUILDING AND MAINTAINING HEALTHY BONES? Weight-bearing and strength-building activities are important for building and maintaining peak bone mass. Weight-bearing activities cause muscles and bones to work against gravity. Strength-building activities increases muscle strength that supports bones. Weight-bearing and muscle-building activities include:  Walking and hiking.  Jogging and running.  Dancing.  Gym exercises.  Lifting weights.  Tennis and racquetball.  Climbing stairs.  Aerobics. Adults should get at least 30 minutes of moderate physical activity on most days. Children should get at least 60 minutes of moderate physical activity on most days. Ask your health care provide what type of exercise is best for you. WHERE CAN I FIND MORE INFORMATION? For more information, check out the following websites:  National Osteoporosis Foundation: http://burton-owens.org/http://nof.org/learn/basics  [employer] of Health: http://www.niams.http://www.johnson-fowler.biz/nih.gov/Health_Info/Bone/Bone_Health/bone_health_for_life.asp   This information is not intended to replace advice given to you by your health care provider. Make sure you discuss any questions you have with your health care provider.   Document Released: 08/28/2003 Document Revised: 10/22/2014 Document Reviewed: 06/12/2014 Elsevier Interactive Patient Education 2016 Else[employer] Inc[employer]   Osteoporosis Osteoporosis is the thinning and loss of density in the bones. Osteoporosis makes the bones more brittle, fragile, and likely to break (fracture). Over time, osteoporosis can cause the bones to become so weak that they fracture after a simple fall. The bones most likely to fracture are the bones in the hip, wrist, and spine. CAUSES  The exact cause is not known. RISK FACTORS Anyone can develop osteoporosis. You may be at greater risk if you have a family history of the condition or have poor nutrition. You may also have a higher risk if you are:    Female.   65 years old or older.  A smoker.  Not physically active.   White or Asian.  Slender. SIGNS AND SYMPTOMS  A fracture might be the  first sign of the disease, especially if it results from a fall or injury that would not usually cause a bone to break. Other signs and symptoms include:   Low back and neck pain.  Stooped posture.  Height loss. DIAGNOSIS  To make a diagnosis, your health care provider may:  Take a medical history.  Perform a physical exam.  Order tests, such as:  A bone mineral density test.  A dual-energy X-ray absorptiometry test. TREATMENT  The goal of osteoporosis treatment is to strengthen your bones to reduce your risk of a fracture. Treatment may involve:  Making lifestyle changes, such as:  Eating a diet rich in calcium.  Doing weight-bearing and muscle-strengthening exercises.  Stopping tobacco use.  Limiting alcohol intake.  Taking medicine to slow the process of bone loss or to increase bone density.  Monitoring your levels of calcium and vitamin D. HOME CARE INSTRUCTIONS  Include calcium and vitamin D in your diet. Calcium is important for bone health, and vitamin D helps the body absorb calcium.  Perform weight-bearing and muscle-strengthening exercises as directed by your health care provider.  Do not use any tobacco products, including cigarettes, chewing tobacco, and electronic cigarettes. If you need help quitting, ask your health care provider.  Limit your alcohol intake.  Take medicines only as directed by your health care provider.  Keep all follow-up visits as directed by your health care provider. This is important.  Take precautions at home to lower your risk of falling, such as:  Keeping rooms well lit and clutter free.  Installing safety rails on stairs.  Using rubber mats in the bathroom and other areas that are often wet or slippery. SEEK IMMEDIATE MEDICAL CARE IF:  You fall or injure yourself.     This information is not intended to replace advice given to you by your health care provider. Make sure you discuss any questions you have with your health care provider.   Document Released: 03/17/2005 Document Revised: 06/28/2014 Document Reviewed: 11/15/2013 Elsevier Interactive Patient Education 2016 [employer].     [place]. Allye Hoyos M.D.

## 2016-04-02 NOTE — Progress Notes (Signed)
Pre visit review using our clinic review tool, if applicable. No additional management support is needed unless otherwise documented below in the visit note. 

## 2016-04-23 ENCOUNTER — Ambulatory Visit: Payer: PPO | Admitting: Family Medicine

## 2016-05-31 ENCOUNTER — Other Ambulatory Visit: Payer: PPO

## 2016-05-31 ENCOUNTER — Ambulatory Visit (INDEPENDENT_AMBULATORY_CARE_PROVIDER_SITE_OTHER): Payer: PPO | Admitting: Pulmonary Disease

## 2016-05-31 ENCOUNTER — Ambulatory Visit (INDEPENDENT_AMBULATORY_CARE_PROVIDER_SITE_OTHER)
Admission: RE | Admit: 2016-05-31 | Discharge: 2016-05-31 | Disposition: A | Discharge status: Home / Self Care | Payer: PPO | Source: Ambulatory Visit | Attending: Pulmonary Disease | Admitting: Pulmonary Disease

## 2016-05-31 ENCOUNTER — Encounter: Payer: Self-pay | Admitting: Pulmonary Disease

## 2016-05-31 VITALS — BP 122/80 | HR 93 | Temp 97.3°F | Ht 61.0 in | Wt 118.0 lb

## 2016-05-31 DIAGNOSIS — D869 Sarcoidosis, unspecified: Secondary | ICD-10-CM | POA: Diagnosis not present

## 2016-05-31 DIAGNOSIS — R918 Other nonspecific abnormal finding of lung field: Secondary | ICD-10-CM | POA: Diagnosis not present

## 2016-05-31 NOTE — Progress Notes (Signed)
Subjective:     Patient ID: Courtney Bray, female   DOB: [birth year], 65 y.o.   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     TBBx RLL by DrClance 1994 showed noncaseating granulomatous inflamm c/w sarcoidosis...  CT Chest scans from 2002=>2004 showed bilat hilar & mediastinal adenopathy w/ interstitial lung changes of her known sarcoidosis; bilat upper lobe nodules R>L are unchanged serially likely representing sarcoidosis as well...   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  PFTs 03/15/06 showed FVC=2.59 (88%), FEV1=1.98 (89%), %1sec=76, mid-flows reduced at 61% predicted; TLC=3.45 (76%), RV=0.86 (53%), RV/TLC=25%; DLCO=101% predicted...  CXRs 2008=>2011 showed bilat hilar prom & stable RUL opac w/ scarring & retraction of the hilum...  PFTs 09/25/08 showed FVC=2.56 (88%), FEV1=2.06 (95%), %1sec=81, mid-flows wnl at 83% predicted; TLC=3.93 (87%), RV=1.26 (76%), RV/TLC=32%,  DLCO=89% predicted...    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 This primarily manifest as cough, and also skin involvement. She is being followed closely  at [facility] for her cutaneous sarcoid, and is being managed on Humira at this point. She has done very well with the medication, and it has also helped control her cough. The patient is very satisfied with her exertional tolerance at this time, and denies any issues with shortness of breath. As stated above, her cough has not been a problem since being on the Humira. She has not had a recent chest x-ray or pulmonary function studies.      IMP>  The patient is doing very well from a pulmonary standpoint. She is having no significant cough or dyspnea on exertion. I suspect the Humira is helping her pulmonary disease as well as her skin disease. She does need to have a followup chest x-ray and pulmonary function studies, and we will schedule these today (not done). I have asked her to stay as active as possible, and to call as if she sees any change in her breathing. She will followup with me in one year.      REC>  Will check cxr today, and call you with results. Please schedule followup breathing studies at your convenience over the next 1-2 mos. Will call you with results.  Followup with me in one year if doing well  CXR 08/31/11> IMP: History of sarcoidosis. Hilar prominence right greater than left may reflect adenopathy. Appear stable. Stable appearance of slight prominence of reticular interstitial markings. No new lesion is evident.  ~  03/15/13:  ROV w/ KC>        Patient comes in today for followup of her known sarcoidosis. This primarily manifests as  skin changes, as well as cough. She is being treated with Humira by her dermatologist, and this has also controlled her cough very well. She reports no shortness of breath, and has been staying very active. Her chest x-ray last year was totally stable, and her breathing test today are stable as well.      IMP>  The patient is doing very well from a sarcoid standpoint, with no increasing shortness of breath or decreased exertional  tolerance. Her x-ray has been stable, and although her total lung capacity it is a little lower, her diffusion capacity is completely normal and stable. Her cough and skin changes are totally controlled on Humira through her dermatologist. Overall, she is satisfied with her symptoms, and I have encouraged her to continue on an aggressive exercise and conditioning program. I would like to see her back in one year, but do not feel that she needs yearly x-rays and PFTs. Will simply do these periodically and as needed.      REC>  Your breathing studies are stable.Good news.Will check xrays and PFT's periodically rather than yearly. Stay as active as possible. Make sure you get your flu shot this fall. Follow up with me in one year, but please call if having increased symptoms.  PFT 03/15/13>  FVC=2.36 (103%), FEV1=1.94 (108%), %1sec=82, mid-flows wnl at 116% predicted;  TLC=3.07 (66%), RV=1.18 (62%), RV/TLC=39; DLCO=91% predicted...   ~  04/22/14:  ROV w/ KC>        The patient comes in today for follow-up of her known sarcoidosis. This is primarily manifesting as skin involvement, and the patient is on Humira through her dermatologist with significant control of pulmonary symptoms and skin changes. She denies any cough, and has had excellent exertional tolerance. She feels that she is asymptomatic from a pulmonary standpoint.      IMP>  The patient is doing very well from a pulmonary standpoint with respect to her sarcoidosis. She is continuing treatment by dermatology for her skin changes, and this in turn has resolved her pulmonary symptoms as well. I would like to see her back in one year to check on things, but she is to call if she has increased symptoms. I will check a chest x-ray at her next visit.      REC>  Continue on your medication thru your dermatologist. Please call if you feel you are having increased pulmonary symptoms. Followup with me in one year, and will check a chest xray the same day.    ~  May 29, 2015:  1year ROV w/ SN>  Her PCP= DrPanosh => last seen 10/16 & they checked PPD skin test= neg (but control not used and some sarcoid pts are anergic).    Today I met Courtney Bray for the first time, reviewed her history, and composed this EPIC chart review... She notes min cough, no sput, denies SOB/ CP, no palpit or edema;  she is active- exercises, walking, does stairs ok, and not limited in any way...     Pulmonary sarcoidosis> Dx 1994 via TBBx showing noncaseating granulomatous inflammation; CT Chest scans showed bilat hilar & mediastinal adenopathy w/ interstitial lung changes w/ bilat upper lobe nodules R>L; PFTs were +/- wnl (poss mild restriction) and minimal pulmonary symptomatology;  She has never required treatment for her pulm dis and ACE levels were not followed...    Cutaneous sarcoidosis> Dx on scalp Bx 2006 & currently followed at [facility] on Humira and doing well on this med... EXAM shows  Afeb, VSS, O2sat=97%;  HEENT- neg, mallampati1;  Chest- clear w/o w/r/r;  Heart- RR Gr1/6 SEM w/o r/g;  Abd- soft, neg;  Ext- neg w/o c/c/e;  Neuro- intact;  Derm- wig covers bald spot on scalp, prev plaques resolved.  CXR 05/29/15 showed norm heart size, stable coarse interstitial markings w/ R>L hilar enlargement & right perihilar opac & vol loss; no active infiltrate or edema; no change from 2013 films...  LABS 10/16 by DrPanosh showed FLP- ok;  Chems- wnl including LFTs;  CBC- wnl x WBC=3.4;  TSH=1.35  LABS 05/29/15:  ACE level = 42 (8-52) indicative of inactive sarcoid... IMP/PLAN>>  Courtney Bray remains essentially asymptomatic from the pulmonary standpoint; her CXR is stable and ACE level is wnl at 42; she remains on Humira for cutaneous sarcoid under the direction of [facility]; we discussed ROV in 71mo   ~  May 31, 2016:  Yearly ROV & f/u sarcoidosis>  Courtney Bray for a routine 155yrOV & f/u Sarcoid- states doing well, no new complaints or concerns, she's had a good yr;  She  [facility] a [facility]...    [facility] & she is due for f/u soon>  Few papules on dorsal hands otherw neg; she is on Humira [id number], clobetasol, & kenalog as needed; no changes made...    She sees DrPanosh for PCP & had Medicare wellness exam 04/02/16> note reviewed, doing satis...    [facility]- felt to be reactive w/o any evid for lymphoprolif disorder; WBCs ranging 3.1 to 4.1, no change since records avail back to 2008, and f/u suggested prn... We reviewed the following medical problems during today's office visit >>     Pulmonary sarcoidosis> Dx 1994 via TBBx showing noncaseating granulomatous inflammation; CT Chest scans showed bilat hilar & mediastinal adenopathy w/ interstitial lung changes w/ bilat upper lobe nodules R>L; PFTs were +/- wnl (poss mild restriction) and minimal pulmonary symptomatology;  She has never required treatment for her pulm dis and ACE levels were not followed... She remains asymptomatic.    Cutaneous sarcoidosis> Dx on scalp Bx 2006 & [facility] and doing well on this med... EXAM shows Afeb, VSS, O2sat=98%;  HEENT- neg, mallampati1;  Chest- clear w/o w/r/r;  Heart- RR Gr1/6 SEM w/o r/g;  Abd- soft, neg;  Ext- neg w/o c/c/e;  Neuro- intact;  Derm- wig covers bald spot on scalp, prev plaques resolved.  CXR 05/31/16 (independently reviewed by me in the PACS system) showed norm heart size, some calcif in the wall of the Ao arch, diffuse incr interstitial markings as before- no change, prom right hilar structures, chr changes c/w sarcoid- NAD...  Labs 05/31/16>  ACE level= 39 IMP/PLAN>>  Hx pulm & cutaneous sarcoid- stable & doing well on Humira shots every other week;  She remains asymptomatic from the pulm standpoint & has never required specific therapy for lung involvement; she knows  to watch for incr cough/ SOB/ chest discomfort & call me anytime... We plan routine ROV w/ CXR & ACE level in 50yr18yr   Past Medical History:  Diagnosis Date  . Chicken pox   . Sarcoidosis (HCC)[place]5/2008   Dxed 1994 by TBBX + ISLD/LN on cxr Primarily manifests as cough and skin changes [facility]  [facility], TLC 3.93 (87%),  DLCO 89%. PFT's 2014:  No obstruction, TLC 3.07, DLCO 91%      Past Surgical History:  Procedure Laterality Date  . TUBAL LIGATION  1979  . UTERINE ARTERY EMBOLIZATION  2000    Outpatient Encounter Prescriptions as of 05/31/2016  Medication Sig  . HUMIRA PEN 40 MG/0.8ML PNKT Inject every other week for sarcoidosis  . Omega-3 Fatty Acids (FISH OIL) 1200 MG CAPS Take 2 capsules by mouth as needed.    No facility-administered encounter medications on file as of 05/31/2016.     Allergies  Allergen Reactions  . Vitamin D Analogs Rash    high dose  Pill had rash     Immunization History  Administered Date(s) Administered  . PPD Test 04/01/2015  . Pneumococcal Conjugate-13 04/02/2016  . Pneumococcal Polysaccharide-23 09/05/2012  . Tdap 02/20/2007  . Zoster 04/22/2011    Current Medications, Allergies, Past Medical History, Past Surgical History, Family History, and Social History were reviewed in [employer] record.   Review of Systems             All symptoms NEG except where BOLDED >>  Constitutional:  F/C/S, fatigue, anorexia, unexpected weight change. HEENT:  HA, visual changes, hearing loss, earache, nasal symptoms, sore throat, mouth sores, hoarseness. Resp:  cough, sputum, hemoptysis; SOB, tightness, wheezing. Cardio:  CP, palpit, DOE, orthopnea, edema. GI:  N/V/D/C, blood in stool; reflux, abd pain, distention, gas. GU:  dysuria, freq, urgency, hematuria, flank pain, voiding difficulty. MS:  joint pain, swelling, tenderness, decr ROM; neck pain, back pain, etc. Neuro:  HA,  tremors, seizures, dizziness, syncope, weakness, numbness, gait abn. Skin:  suspicious lesions or skin rash, scalp plaques have improved on Rx. Heme:  adenopathy, bruising, bleeding. Psyche:  confusion, agitation, sleep disturbance, hallucinations, anxiety, depression suicidal.   Objective:   Physical Exam       Vital Signs:  Reviewed...   General:  WD, WN, 65 y/o F in NAD; alert & oriented; pleasant & cooperative... HEENT:  [place]/AT; Conjunctiva- pink, Sclera- nonicteric, EOM-wnl, PERRLA, Fundi-benign; EACs-clear, TMs-wnl; NOSE-clear; THROAT-clear & wnl.  Neck:  Supple w/ fair ROM; no JVD; normal carotid impulses w/o bruits; no thyromegaly or nodules palpated; no lymphadenopathy palpated. Chest:  Clear to P & A; without wheezes, rales, or rhonchi heard. Heart:  Regular Rhythm; norm S1 & S2 Gr1/6 SEM w/o rubs or gallops detected. Abdomen:  Soft & nontender- no guarding or rebound; normal bowel sounds; no organomegaly or masses palpated. Ext:  Normal ROM; without deformities or arthritic changes; no varicose veins, venous insuffic, or edema;  Pulses intact w/o bruits. Neuro:  CNs II-XII intact; motor testing normal; sensory testing normal; gait normal & balance OK. Derm:  No lesions noted; no rash etc; she is wearing a wig that covers her bald spot. Lymph:  No cervical, supraclavicular, axillary, or inguinal adenopathy palpated.   Assessment:      IMP >>     Pulmonary sarcoidosis>  She remains essentially asymptomatic, CXR is stable, ACE level is wnl;  We will continue to follow...    Cutaneous sarcoidosis>  Followed by Kennieth Rad on Humira and improved, no new skin lesons...    Medical issues:  Followed by DrPanosh.  PLAN >>     Courtney Bray remains essentially asymptomatic from the pulmonary standpoint; her CXR is stable and ACE level is wnl at 42; she remains on Humira for cutaneous sarcoid under the direction of [place]; we discussed ROV in 30mo 05/31/16>   Hx pulm &  cutaneous sarcoid-  stable & doing well on Humira shots every other week;  She remains asymptomatic from the pulm standpoint & has never required specific therapy for lung involvement; she knows to watch for incr cough/ SOB/ chest discomfort & call me anytime... We plan routine ROV w/ CXR & ACE level in 38yr    Plan:     Patient's Medications  New Prescriptions   No medications on file  Previous Medications   HUMIRA PEN 40 MG/0.8ML PNKT    Inject every other week for sarcoidosis   OMEGA-3 FATTY ACIDS (FISH OIL) 1200 MG CAPS    Take 2 capsules by mouth as needed.   Modified Medications   No medications on file  Discontinued Medications   No medications on file

## 2016-05-31 NOTE — Patient Instructions (Signed)
Steward DroneBrenda-- it was great seeing you again...  Today we checked your follow up CXR & Sarcoid blood test (ACE level)...    We will contact you w/ the results when available...   Keep up the good work w/ your exercise program...    And the Humira via [facility]...  Call for any questions...  Let's plan a follow up visit in [id number], sooner if needed for problems.Marland Kitchen..Marland Kitchen

## 2016-06-01 LAB — ANGIOTENSIN CONVERTING ENZYME: ANGIOTENSIN-CONVERTING ENZYME: 39 U/L (ref 9–67)

## 2016-06-17 DIAGNOSIS — Z79899 Other long term (current) drug therapy: Secondary | ICD-10-CM | POA: Diagnosis not present

## 2016-06-17 DIAGNOSIS — L309 Dermatitis, unspecified: Secondary | ICD-10-CM | POA: Diagnosis not present

## 2016-06-17 DIAGNOSIS — D860 Sarcoidosis of lung: Secondary | ICD-10-CM | POA: Diagnosis not present

## 2016-06-17 DIAGNOSIS — Z7189 Other specified counseling: Secondary | ICD-10-CM | POA: Diagnosis not present

## 2016-06-17 DIAGNOSIS — D869 Sarcoidosis, unspecified: Secondary | ICD-10-CM | POA: Diagnosis not present

## 2016-09-29 ENCOUNTER — Encounter: Payer: Self-pay | Admitting: Internal Medicine

## 2016-09-29 ENCOUNTER — Ambulatory Visit (INDEPENDENT_AMBULATORY_CARE_PROVIDER_SITE_OTHER): Payer: PPO | Admitting: Internal Medicine

## 2016-09-29 VITALS — BP 130/80 | HR 86 | Temp 98.3°F | Ht 61.0 in | Wt 117.6 lb

## 2016-09-29 DIAGNOSIS — D869 Sarcoidosis, unspecified: Secondary | ICD-10-CM

## 2016-09-29 DIAGNOSIS — J039 Acute tonsillitis, unspecified: Secondary | ICD-10-CM | POA: Diagnosis not present

## 2016-09-29 DIAGNOSIS — Z79899 Other long term (current) drug therapy: Secondary | ICD-10-CM | POA: Diagnosis not present

## 2016-09-29 LAB — POCT RAPID STREP A (OFFICE): Rapid Strep A Screen: NEGATIVE

## 2016-09-29 MED ORDER — AMOXICILLIN 875 MG PO TABS: 875.0000 mg | ORAL_TABLET | Freq: Two times a day (BID) | ORAL | 0 refills | Status: DC

## 2016-09-29 NOTE — Patient Instructions (Signed)
Rapid strep test is negative. Symptomatic treatment awaiting culture test. However her throat gets worse with swollen glands or fever take the antibiotic. If your symptoms aren't better in a week to 10 days or if worse contact us for recheck and may do blood work white count etc. Would hold on the Humira until improved and the infection is better.

## 2016-09-29 NOTE — Progress Notes (Signed)
Chief Complaint  Patient presents with  . swollen tonsils    started a week ago     HPI: Courtney Bray 66 y.o.  sda  Problem based visit  Sees  Dr Kriste Basque sarcoidosis  And lymphocytosis  followed dr Clelia Croft    Checking on rpn basis   On humira  q 2 weeks  No fever but had scratchy throat better and now glands feel mildly sore . No fever dysphagia  Cough rhinorrhea   actually is getting better bur  Husband noted   Spots  On tonsils  . No strep exposures   ? No hx of mono ?   Had tonsil problems  "up until 40 "  ROS: See pertinent positives and negatives per HPI.  Past Medical History:  Diagnosis Date  . Chicken pox   . Sarcoidosis (HCC) 02/24/2007   Dxed 1994 by TBBX + ISLD/LN on cxr Primarily manifests as cough and skin changes Tx with Humira by derm at [facility].  PFT's 2010:  No obstruction, TLC 3.93 (87%), DLCO 89%. PFT's 2014:  No obstruction, TLC 3.07, DLCO 91%      Family History  Problem Relation Age of Onset  . Heart disease Father   . Stroke Father   . Dementia Father   . Dementia Mother   . High blood pressure Mother   . High blood pressure Sister     with hypertesnive renal disease    Social History   Social History  . Marital status: Married    Spouse name: N/A  . Number of children: N/A  . Years of education: N/A   Social History Main Topics  . Smoking status: Never Smoker  . Smokeless tobacco: Never Used  . Alcohol use 0.0 oz/week     Comment: Occasional  . Drug use: No  . Sexual activity: Yes    Partners: Male   Other Topics Concern  . 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   Orig from [place]   in [place] since 1988   [name]     Outpatient Medications Prior to  Visit  Medication Sig Dispense Refill  . HUMIRA PEN 40 MG/0.8ML PNKT Inject every other week for sarcoidosis    . Omega-3 Fatty Acids (FISH OIL) 1200 MG CAPS Take 2 capsules by mouth as needed.      No facility-administered medications prior to visit.      EXAM:  BP 130/80 (BP Location: Left Arm, Patient Position: Sitting, Cuff Size: Normal)   Pulse 86   Temp 98.3 F (36.8 C) (Oral)   Ht  (1.549 m)   Wt 117 lb 9.6 oz (53.3 kg)   BMI 22.22 kg/m   Body mass index is 22.22 kg/m.  GENERAL: vitals reviewed and listed above, alert, oriented, appears well hydrated and in no acute distress HEENT: atraumatic, conjunctiva  clear, no obvious abnormalities on inspection of external nose and ears  tms nl OP : tonsil 2+  yhpertrophies with pinpoint exudate evenely  No edema and good airway   NECK: no obvious masses on inspection   Small 1+  ac nodes  Mildly tender  [place] pn LUNGS: clear to auscultation bilaterally, no wheezes, rales or rhonchi, good air movement CV: HRRR, no clubbing cyanosis  Abdomen:  Sof,t normal bowel sounds without hepatosplenomegaly, no guarding rebound or masses no CVA tenderness MS: moves all extremities without noticeable focal  abnormality PSYCH: pleasant and cooperative, no obvious depression or anxiety  ASSESSMENT AND PLAN:  Discussed the following assessment and plan:  Tonsillitis - Plan: POCT rapid strep A, Culture, Group A Strep  High risk medication use  Sarcoidosis (HCC) Clinically per patient getting better. But is on high risk medicine. Minimal adenopathy before meals no other alarming symptoms. On humira   Hold until better strep and  cx  antiboitic rx yto hold if  persistent or progressive then we may do blood work etc .  Or if fever  Systemic sx  -Patient advised to return or notify health care team  if symptoms worsen ,persist or new concerns arise.  Patient Instructions  Rapid strep test is negative. Symptomatic treatment awaiting culture  test. However her throat gets worse with swollen glands or fever take the antibiotic. If your symptoms aren't better in a week to 10 days or if worse contact us for recheck and may do blood work white count etc. Would hold on the Humira until improved and the infection is better.    Neta Mends. Panosh M.D.

## 2016-10-01 LAB — CULTURE, GROUP A STREP

## 2017-03-11 ENCOUNTER — Encounter: Payer: Self-pay | Admitting: Internal Medicine

## 2017-04-01 ENCOUNTER — Other Ambulatory Visit: Payer: PPO

## 2017-04-07 NOTE — Progress Notes (Signed)
Chief Complaint  Patient presents with  . Annual Exam    no new concerns    HPI: Courtney Bray 66 y.o. comes in today for Preventive Medicare exam/ wellness visit .Since last visit.  She is seeing her pulmonologist and her dermatologist she is on you marrow for her sarcoid.  Gets regular blood counts because of lymphocytosis on blood test.   She needs PPD tests TB screening because of her high risk medication. No symptoms.  She takes care of her 29 year old plus mom who has some dementia but has some self-help skills.  She has a history of osteoporosis by bone density but is unable to tolerate vitamin D. At this point doing lifestyle follow-up bone density in 2-3 years from the last one which will be next year.   Health Maintenance  Topic Date Due  . Hepatitis C Screening  June 01, 1951  . TETANUS/TDAP  02/19/2017  . INFLUENZA VACCINE  09/18/2017 (Originally 01/19/2017)  . PNA vac Low Risk Adult (2 of 2 - PPSV23) 09/05/2017  . MAMMOGRAM  04/08/2018  . COLONOSCOPY  02/15/2019  . DEXA SCAN  Completed   Health Maintenance Review LIFESTYLE:  Exercise:   Walking.  Tobacco/ETS: no Alcohol:  Couple  Per week or none  Sugar beverages: Sleep:  6+   Drug use: no HH: 2 dog   .   caregive  Every day .  90    Hearing:   Ok   Vision:  No limitations at present . Last eye check UTD  Safety:  Has smoke detector and wears seat belts.  No firearms. No excess sun exposure. Sees dentist regularly.  Falls:  n  Advance directive :  Reviewed    Memory: Felt to be good  , no concern from her or her family.  Depression: No anhedonia unusual crying or depressive symptoms  Nutrition: Eats well balanced diet; adequate calcium and vitamin D. No swallowing chewing problems.  Injury: no major injuries in the last six months.  Other healthcare providers:  Reviewed today .  Social:  Lives with spouse married. Dogs   Preventive parameters: up-to-date  Reviewed   ADLS:   There are no  problems or need for assistance  driving, feeding, obtaining food, dressing, toileting and bathing, managing money using phone. She is independent.   ROS: States that her tonsils are still larger but not any bigger and they don't hurt no systemic symptoms no swollen glands. ? watr on fnger hands ?  GEN/ HEENT: No fever, significant weight changes sweats headaches vision problems hearing changes, CV/ PULM; No chest pain shortness of breath cough, syncope,edema  change in exercise tolerance. GI /GU: No adominal pain, vomiting, change in bowel habits. No blood in the stool. No significant GU symptoms. SKIN/HEME: ,no acute skin rashes suspicious lesions or bleeding. No lymphadenopathy, nodules, masses.  NEURO/ PSYCH:  No neurologic signs such as weakness numbness. No depression anxiety. IMM/ Allergy: No unusual infections.  Allergy .   REST of 12 system review negative except as per HPI   Past Medical History:  Diagnosis Date  . Chicken pox   . 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  PFT's 2010:  No obstruction, TLC 3.93 (87%), DLCO 89%. PFT's 2014:  No obstruction, TLC 3.07, DLCO 91%      Family History  Problem Relation Age of Onset  . Heart disease Father   .  Stroke Father   . Dementia Father   . Dementia Mother   . High blood pressure Mother   . High blood pressure Sister        with hypertesnive renal disease    Social History   Social History  . Marital status: Married    Spouse name: N/A  . Number of children: N/A  . Years of education: N/A   Social History Main Topics  . Smoking status: Never Smoker  . Smokeless tobacco: Never Used  . Alcohol use 0.0 oz/week     Comment: Occasional  . Drug use: No  . Sexual activity: Yes    Partners: Male   Other Topics Concern  . None   Social History Narrative   5-6 hours of sleep per night   Lives with her husband who is  retired Surveyor, quantity business   Has 4 children and all have moved out   Does not work outside the home. 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   Orig from [place]   in [place]     Outpatient Encounter Prescriptions as of 04/08/2017  Medication Sig  . clobetasol cream (TEMOVATE) 0.05 % Apply topically as needed.  . Clobetasol Prop Crea-Coal Tar (CLOBETAPLUS CREAM EX) Apply topically.  Marland Kitchen HUMIRA PEN 40 MG/0.8ML PNKT Inject every other week for sarcoidosis  . Omega-3 Fatty Acids (FISH OIL) 1200 MG CAPS Take 2 capsules by mouth as needed.   . Zoster Vaccine Adjuvanted [facility]) injection Inject 0.5 mLs into the muscle once. Repeat in 2-6 months  . [DISCONTINUED] amoxicillin (AMOXIL) 875 MG tablet Take 1 tablet (875 mg total) by mouth 2 (two) times daily. (Patient not taking: Reported on 04/08/2017)   No facility-administered encounter medications on file as of 04/08/2017.     EXAM:  BP 124/90 (BP Location: Left Arm, Patient Position: Sitting, Cuff Size: Normal)   Pulse 89   Temp 98.3 F (36.8 C) (Oral)   Ht 5' 1.5" (1.562 m)   Wt 115 lb (52.2 kg)   BMI 21.38 kg/m   Body mass index is 21.38 kg/m.  Physical Exam: Vital signs reviewed YJE:HUDJ is a well-developed well-nourished alert cooperative   who appears stated age in no acute distress.  HEENT: normocephalic atraumatic , Eyes: PERRL EOM's full, conjunctiva clear, Nares: paten,t no deformity discharge or tenderness., Ears: no deformity EAC's clear TMs with normal landmarks. Mouth: clear OP, no lesions, edema.  Tonsil;1+ no edema redness or  dc Moist mucous membranes. Dentition in adequate repair. NECK: supple without masses, thyromegaly or bruits. CHEST/PULM:  Clear to auscultation and percussion breath sounds equal no wheeze , rales or rhonchi. No chest wall deformities or tenderness. CV: PMI is nondisplaced, S1 S2 no gallops,  murmurs, rubs. Peripheral pulses are full without delay.No JVD .  ABDOMEN: Bowel sounds normal nontender  No guard or rebound, no hepato splenomegal no CVA tenderness.  Breast: normal by inspection . No dimpling, discharge, masses, tenderness or discharge . Inverted nipples not new Extremtities:  No clubbing cyanosis or edema, no acute joint swelling or redness no focal atrophy NEURO:  Oriented x3, cranial nerves 3-12 appear to be intact, no obvious focal weakness,gait within normal limits no abnormal reflexes or asymmetrical SKIN: No acute rashes normal turgor, color, no bruising or petechiae. Wart area on r thumb nad left hand  PSYCH: Oriented, good eye contact, no obvious depression anxiety, cognition and judgment appear normal. LN: no cervical axillary inguinal adenopathy No noted deficits in memory, attention, and speech.   Lab Results  Component Value Date   WBC 3.3 (L) 04/08/2017   HGB 14.6 04/08/2017   HCT 43.4 04/08/2017   PLT 274.0 04/08/2017   GLUCOSE 89 04/08/2017   CHOL 197 04/08/2017   TRIG 61.0 04/08/2017   HDL 66.90 04/08/2017   LDLCALC 118 (H) 04/08/2017   ALT 27 04/08/2017   AST 22 04/08/2017   NA 138 04/08/2017   K 4.3 04/08/2017   CL 100 04/08/2017   CREATININE 0.68 04/08/2017   BUN 13 04/08/2017   CO2 29 04/08/2017   TSH 1.53 03/26/2016    ASSESSMENT AND PLAN:  Discussed the following assessment and plan:  Visit for preventive health examination - Plan: Basic metabolic panel, CBC with Differential/Platelet, Hepatic function panel, Lipid panel  Sarcoidosis - Plan: Basic metabolic panel, CBC with Differential/Platelet, Hepatic function panel, Lipid panel  Lymphocytosis relative  - normal exam except  prominent  tonsills  for age but stable and no sx  - Plan: Basic metabolic panel, CBC with Differential/Platelet, Hepatic function panel, Lipid panel  High risk medication use - Plan: Basic metabolic panel, CBC with Differential/Platelet, Hepatic function  panel, Lipid panel, VITAMIN D 25 Hydroxy (Vit-D Deficiency, Fractures)  Osteoporosis without current pathological fracture, unspecified osteoporosis type - Plan: Basic metabolic panel, CBC with Differential/Platelet, Hepatic function panel, Lipid panel, VITAMIN D 25 Hydroxy (Vit-D Deficiency, Fractures)  Vitamin D deficiency - Plan: VITAMIN D 25 Hydroxy (Vit-D Deficiency, Fractures)  Need for tuberculosis vaccination - Plan: TB Skin Test  Influenza vaccination declined by patient  Sarcoidosis, lung (HCC)  Sarcoidosis of skin  Viral warts, unspecified type - on fingers disc topicals and disc with derm  for further  She is not diabetic and I don't know why the ehr parameters  flags her as such. Patient Care Team: Tavius Turgeon, Standley Brooking, MD as PCP - General (Internal Medicine) Garry Heater, MD as Referring Physician (Dermatology) Noralee Space, MD as Consulting Physician (Pulmonary Disease)  Patient Instructions  Will notify you  of labs when available. Continue lifestyle intervention healthy eating and exercise . Plan dexa next year to assess stabilely .  Come back for readings ppd  On Monday .   shingrix vaccine  reconsdier flu vaccine  For reasons discussed .  If your tonsils get bigger or sx need recheck.     Preventive Care 49 Years and Older, Female Preventive care refers to lifestyle choices and visits with your health care provider that can promote health and wellness. What does preventive care include?  A yearly physical exam. This is also called an annual well check.  Dental exams once or twice a year.  Routine eye exams. Ask your health care provider how often you should have your eyes checked.  Personal lifestyle choices, including: ? Daily care of your teeth and gums. ? Regular physical activity. ? Eating a healthy diet. ? Avoiding tobacco and drug use. ? Limiting alcohol use. ? Practicing safe sex. ? Taking low-dose aspirin every day. ? Taking vitamin  and mineral supplements as recommended by your health care provider. What happens during an annual well check? The services and screenings done by your health care provider during your annual well check will depend on your age, overall health, lifestyle risk factors, and family history of disease. Counseling Your health care provider may ask  you questions about your:  Alcohol use.  Tobacco use.  Drug use.  Emotional well-being.  Home and relationship well-being.  Sexual activity.  Eating habits.  History of falls.  Memory and ability to understand (cognition).  Work and work Statistician.  Reproductive health.  Screening You may have the following tests or measurements:  Height, weight, and BMI.  Blood pressure.  Lipid and cholesterol levels. These may be checked every 5 years, or more frequently if you are over 30 years old.  Skin check.  Lung cancer screening. You may have this screening every year starting at age 30 if you have a 30-pack-year history of smoking and currently smoke or have quit within the past 15 years.  Fecal occult blood test (FOBT) of the stool. You may have this test every year starting at age 85.  Flexible sigmoidoscopy or colonoscopy. You may have a sigmoidoscopy every 5 years or a colonoscopy every 10 years starting at age 32.  Hepatitis C blood test.  Hepatitis B blood test.  Sexually transmitted disease (STD) testing.  Diabetes screening. This is done by checking your blood sugar (glucose) after you have not eaten for a while (fasting). You may have this done every 1-3 years.  Bone density scan. This is done to screen for osteoporosis. You may have this done starting at age 83.  Mammogram. This may be done every 1-2 years. Talk to your health care provider about how often you should have regular mammograms.  Talk with your health care provider about your test results, treatment options, and if necessary, the need for more  tests. Vaccines Your health care provider may recommend certain vaccines, such as:  Influenza vaccine. This is recommended every year.  Tetanus, diphtheria, and acellular pertussis (Tdap, Td) vaccine. You may need a Td booster every 10 years.  Varicella vaccine. You may need this if you have not been vaccinated.  Zoster vaccine. You may need this after age 37.  Measles, mumps, and rubella (MMR) vaccine. You may need at least one dose of MMR if you were born in 1957 or later. You may also need a second dose.  Pneumococcal 13-valent conjugate (PCV13) vaccine. One dose is recommended after age 37.  Pneumococcal polysaccharide (PPSV23) vaccine. One dose is recommended after age 7.  Meningococcal vaccine. You may need this if you have certain conditions.  Hepatitis A vaccine. You may need this if you have certain conditions or if you travel or work in places where you may be exposed to hepatitis A.  Hepatitis B vaccine. You may need this if you have certain conditions or if you travel or work in places where you may be exposed to hepatitis B.  Haemophilus influenzae type b (Hib) vaccine. You may need this if you have certain conditions.  Talk to your health care provider about which screenings and vaccines you need and how often you need them. This information is not intended to replace advice given to you by your health care provider. Make sure you discuss any questions you have with your health care provider. Document Released: 07/04/2015 Document Revised: 02/25/2016 Document Reviewed: 04/08/2015 Elsevier Interactive Patient Education  2017 [employer]. Malee Grays M.D.

## 2017-04-08 ENCOUNTER — Ambulatory Visit (INDEPENDENT_AMBULATORY_CARE_PROVIDER_SITE_OTHER): Payer: PPO | Admitting: Internal Medicine

## 2017-04-08 ENCOUNTER — Encounter: Payer: Self-pay | Admitting: Internal Medicine

## 2017-04-08 VITALS — BP 124/90 | HR 89 | Temp 98.3°F | Ht 61.5 in | Wt 115.0 lb

## 2017-04-08 DIAGNOSIS — D860 Sarcoidosis of lung: Secondary | ICD-10-CM | POA: Diagnosis not present

## 2017-04-08 DIAGNOSIS — M810 Age-related osteoporosis without current pathological fracture: Secondary | ICD-10-CM | POA: Diagnosis not present

## 2017-04-08 DIAGNOSIS — Z2821 Immunization not carried out because of patient refusal: Secondary | ICD-10-CM

## 2017-04-08 DIAGNOSIS — Z23 Encounter for immunization: Secondary | ICD-10-CM | POA: Diagnosis not present

## 2017-04-08 DIAGNOSIS — D863 Sarcoidosis of skin: Secondary | ICD-10-CM

## 2017-04-08 DIAGNOSIS — E559 Vitamin D deficiency, unspecified: Secondary | ICD-10-CM

## 2017-04-08 DIAGNOSIS — D869 Sarcoidosis, unspecified: Secondary | ICD-10-CM

## 2017-04-08 DIAGNOSIS — B079 Viral wart, unspecified: Secondary | ICD-10-CM

## 2017-04-08 DIAGNOSIS — D72820 Lymphocytosis (symptomatic): Secondary | ICD-10-CM | POA: Diagnosis not present

## 2017-04-08 DIAGNOSIS — Z79899 Other long term (current) drug therapy: Secondary | ICD-10-CM

## 2017-04-08 DIAGNOSIS — Z0000 Encounter for general adult medical examination without abnormal findings: Secondary | ICD-10-CM | POA: Diagnosis not present

## 2017-04-08 LAB — HEPATIC FUNCTION PANEL
ALT: 27 U/L (ref 0–35)
AST: 22 U/L (ref 0–37)
Albumin: 4.1 g/dL (ref 3.5–5.2)
Alkaline Phosphatase: 82 U/L (ref 39–117)
BILIRUBIN TOTAL: 0.6 mg/dL (ref 0.2–1.2)
Bilirubin, Direct: 0.1 mg/dL (ref 0.0–0.3)
Total Protein: 7.5 g/dL (ref 6.0–8.3)

## 2017-04-08 LAB — LIPID PANEL
CHOLESTEROL: 197 mg/dL (ref 0–200)
HDL: 66.9 mg/dL (ref >39.00)
LDL CALC: 118 mg/dL — AB (ref 0–99)
NonHDL: 129.95
TRIGLYCERIDES: 61 mg/dL (ref 0.0–149.0)
Total CHOL/HDL Ratio: 3
VLDL: 12.2 mg/dL (ref 0.0–40.0)

## 2017-04-08 LAB — CBC WITH DIFFERENTIAL/PLATELET
BASOS PCT: 1.3 % (ref 0.0–3.0)
Basophils Absolute: 0 10*3/uL (ref 0.0–0.1)
Eosinophils Absolute: 0.2 10*3/uL (ref 0.0–0.7)
Eosinophils Relative: 4.6 % (ref 0.0–5.0)
HCT: 43.4 % (ref 36.0–46.0)
HEMOGLOBIN: 14.6 g/dL (ref 12.0–15.0)
LYMPHS ABS: 1.9 10*3/uL (ref 0.7–4.0)
Lymphocytes Relative: 57.6 % — ABNORMAL HIGH (ref 12.0–46.0)
MCHC: 33.6 g/dL (ref 30.0–36.0)
MCV: 94.9 fl (ref 78.0–100.0)
MONO ABS: 0.3 10*3/uL (ref 0.1–1.0)
Monocytes Relative: 9.3 % (ref 3.0–12.0)
NEUTROS ABS: 0.9 10*3/uL — AB (ref 1.4–7.7)
NEUTROS PCT: 27.2 % — AB (ref 43.0–77.0)
PLATELETS: 274 10*3/uL (ref 150.0–400.0)
RBC: 4.57 Mil/uL (ref 3.87–5.11)
RDW: 12.6 % (ref 11.5–15.5)
WBC: 3.3 10*3/uL — ABNORMAL LOW (ref 4.0–10.5)

## 2017-04-08 LAB — BASIC METABOLIC PANEL
BUN: 13 mg/dL (ref 6–23)
CHLORIDE: 100 meq/L (ref 96–112)
CO2: 29 mEq/L (ref 19–32)
Calcium: 9.4 mg/dL (ref 8.4–10.5)
Creatinine, Ser: 0.68 mg/dL (ref 0.40–1.20)
GFR: 111.29 mL/min (ref >60.00)
GLUCOSE: 89 mg/dL (ref 70–99)
POTASSIUM: 4.3 meq/L (ref 3.5–5.1)
Sodium: 138 mEq/L (ref 135–145)

## 2017-04-08 LAB — VITAMIN D 25 HYDROXY (VIT D DEFICIENCY, FRACTURES): VITD: 21.17 ng/mL — ABNORMAL LOW (ref 30.00–100.00)

## 2017-04-08 MED ORDER — ZOSTER VAC RECOMB ADJUVANTED 50 MCG/0.5ML IM SUSR: 0.5000 mL | Freq: Once | INTRAMUSCULAR | 1 refills | Status: AC

## 2017-04-08 NOTE — Patient Instructions (Addendum)
Will notify you  of labs when available. Continue lifestyle intervention healthy eating and exercise . Plan dexa next year to assess stabilely .  Come back for readings ppd  On Monday .   shingrix vaccine  reconsdier flu vaccine  For reasons discussed .  If your tonsils get bigger or sx need recheck.     Preventive Care 54 Years and Older, Female Preventive care refers to lifestyle choices and visits with your health care provider that can promote health and wellness. What does preventive care include?  A yearly physical exam. This is also called an annual well check.  Dental exams once or twice a year.  Routine eye exams. Ask your health care provider how often you should have your eyes checked.  Personal lifestyle choices, including: ? Daily care of your teeth and gums. ? Regular physical activity. ? Eating a healthy diet. ? Avoiding tobacco and drug use. ? Limiting alcohol use. ? Practicing safe sex. ? Taking low-dose aspirin every day. ? Taking vitamin and mineral supplements as recommended by your health care provider. What happens during an annual well check? The services and screenings done by your health care provider during your annual well check will depend on your age, overall health, lifestyle risk factors, and family history of disease. Counseling Your health care provider may ask you questions about your:  Alcohol use.  Tobacco use.  Drug use.  Emotional well-being.  Home and relationship well-being.  Sexual activity.  Eating habits.  History of falls.  Memory and ability to understand (cognition).  Work and work Statistician.  Reproductive health.  Screening You may have the following tests or measurements:  Height, weight, and BMI.  Blood pressure.  Lipid and cholesterol levels. These may be checked every 5 years, or more frequently if you are over 6 years old.  Skin check.  Lung cancer screening. You may have this screening every  year starting at age 8 if you have a 30-pack-year history of smoking and currently smoke or have quit within the past 15 years.  Fecal occult blood test (FOBT) of the stool. You may have this test every year starting at age 46.  Flexible sigmoidoscopy or colonoscopy. You may have a sigmoidoscopy every 5 years or a colonoscopy every 10 years starting at age 73.  Hepatitis C blood test.  Hepatitis B blood test.  Sexually transmitted disease (STD) testing.  Diabetes screening. This is done by checking your blood sugar (glucose) after you have not eaten for a while (fasting). You may have this done every 1-3 years.  Bone density scan. This is done to screen for osteoporosis. You may have this done starting at age 44.  Mammogram. This may be done every 1-2 years. Talk to your health care provider about how often you should have regular mammograms.  Talk with your health care provider about your test results, treatment options, and if necessary, the need for more tests. Vaccines Your health care provider may recommend certain vaccines, such as:  Influenza vaccine. This is recommended every year.  Tetanus, diphtheria, and acellular pertussis (Tdap, Td) vaccine. You may need a Td booster every 10 years.  Varicella vaccine. You may need this if you have not been vaccinated.  Zoster vaccine. You may need this after age 63.  Measles, mumps, and rubella (MMR) vaccine. You may need at least one dose of MMR if you were born in 1957 or later. You may also need a second dose.  Pneumococcal 13-valent conjugate (  PCV13) vaccine. One dose is recommended after age 43.  Pneumococcal polysaccharide (PPSV23) vaccine. One dose is recommended after age 81.  Meningococcal vaccine. You may need this if you have certain conditions.  Hepatitis A vaccine. You may need this if you have certain conditions or if you travel or work in places where you may be exposed to hepatitis A.  Hepatitis B vaccine. You  may need this if you have certain conditions or if you travel or work in places where you may be exposed to hepatitis B.  Haemophilus influenzae type b (Hib) vaccine. You may need this if you have certain conditions.  Talk to your health care provider about which screenings and vaccines you need and how often you need them. This information is not intended to replace advice given to you by your health care provider. Make sure you discuss any questions you have with your health care provider. Document Released: 07/04/2015 Document Revised: 02/25/2016 Document Reviewed: 04/08/2015 Elsevier Interactive Patient Education  2017 [employer].

## 2017-05-02 ENCOUNTER — Ambulatory Visit (INDEPENDENT_AMBULATORY_CARE_PROVIDER_SITE_OTHER): Payer: PPO

## 2017-05-02 DIAGNOSIS — Z23 Encounter for immunization: Secondary | ICD-10-CM

## 2017-05-04 NOTE — Patient Instructions (Signed)
PPD Reading Note  PPD read and results entered in EpicCare.  Result: 0 mm induration.  Interpretation: negative  If test not read within 48-72 hours of initial placement, patient advised to repeat in other arm 1-3 weeks after this test.  Allergic reaction: no

## 2017-05-05 ENCOUNTER — Telehealth: Payer: Self-pay | Admitting: Pulmonary Disease

## 2017-05-05 NOTE — Telephone Encounter (Signed)
error 

## 2017-05-06 ENCOUNTER — Telehealth: Payer: Self-pay | Admitting: *Deleted

## 2017-05-06 NOTE — Telephone Encounter (Signed)
Copied from CRM [id number]. Topic: Quick Communication - Other Results >> May 05, 2017  9:25 AM Crist InfanteHarrald, Kathy J wrote: Pt needs results of TB skin test faxed to her dermatologist, [facility]  Dr Renaldo ReelHuang fax :[phone] Attn: Nurse Pt had tb test read yesterday in our office

## 2017-05-06 NOTE — Telephone Encounter (Signed)
Will hold in my box to follow up Monday 05/09/17 - I am not able to print this today and fax so will have to do this when back in the office on 11/19. Will hold in my box to follow up

## 2017-05-09 NOTE — Telephone Encounter (Signed)
Spoke with patient, requests that there PPD read [facility] -- this has been taken care of.  Pt requests a copy be mailed to her home address -- this has been taken are of as well.  Nothing further needed.

## 2017-05-11 ENCOUNTER — Telehealth: Payer: Self-pay | Admitting: Family Medicine

## 2017-05-11 NOTE — Telephone Encounter (Signed)
Pt aware  Courtney Bray 05/09/17 8:38 AM   Note  Spoke with patient, requests that there PPD read be [facility] -- this has been taken care of.  Pt requests a copy be mailed to her home address -- this has been taken are of as well.  Nothing further needed.

## 2017-05-11 NOTE — Telephone Encounter (Signed)
Copied from CRM [phone] [phone]. Topic: Quick Communication - Other Results >> May 05, 2017  9:25 AM Crist InfanteHarrald, Kathy J wrote: Pt needs results of TB skin test faxed to her dermatologist, [facility]  Dr Renaldo ReelHuang fax :[phone] Attn: Nurse Pt had tb test read yesterday in our office  >> May 11, 2017  4:11 PM Percival SpanishKennedy, Cheryl W wrote:   Pt call to follow up for her req for TB test to be sent to her dermatologist  Dr  Renaldo ReelHuang    [phone]

## 2017-05-24 DIAGNOSIS — D869 Sarcoidosis, unspecified: Secondary | ICD-10-CM | POA: Diagnosis not present

## 2017-05-24 DIAGNOSIS — R21 Rash and other nonspecific skin eruption: Secondary | ICD-10-CM | POA: Diagnosis not present

## 2017-05-31 ENCOUNTER — Ambulatory Visit: Payer: PPO | Admitting: Pulmonary Disease

## 2017-06-08 ENCOUNTER — Other Ambulatory Visit: Payer: Self-pay | Admitting: *Deleted

## 2017-06-08 NOTE — Patient Outreach (Signed)
[facility]) Care Management  06/08/2017  Courtney Bray 12/07/1950 [id number]  Telephone Screen  Referral Date: 06/07/17 Referral Source: [employer] Referral Reason: Member needs assistance with the cost of prescription. Insurance: [employer]TA   Outreach attempt # 1 to patient. HIPAA verified with patient. Patient stated, she contacted [employer] to request a deduction in her Tier 1 medications. She explained how she heard that this type of request can be made to the insurance company. Patient verbalized having a history of Sarcoidosis and she was prescribed Humira. She spoke about Humira being too expensive, however it has help to improve her condition. [facility] services and benefits explained to patient and she agreed to services.    Plan: RN CM will send [facility] referral for possible assistance with affording medication. RN CM advised patient to contact RN CM for any needs or concerns.   Courtney ClevelandJuanita Courtney Strothers, RN, BSN, MHA/MSL, [facility] Telephonic Care Manager Coordinator [facility] Direct Phone: [phone] Toll Free: [phone] Fax: [phone]

## 2017-06-13 ENCOUNTER — Other Ambulatory Visit: Payer: Self-pay | Admitting: Pharmacist

## 2017-06-15 ENCOUNTER — Other Ambulatory Visit: Payer: Self-pay | Admitting: Pharmacist

## 2017-06-15 ENCOUNTER — Encounter: Payer: Self-pay | Admitting: Pharmacist

## 2017-06-15 NOTE — Patient Outreach (Signed)
[facility]) Care Management  Late Entry for 06/13/17  Courtney Bray [birth year] [id number]   Called patient regarding medication assistance. Her husband Courtney Din([place]) answered the phone. He is on the patient's HIPAA form.  Patient's husband said he started a tier exception for Humira with [facility].  [facility] was called. The representative at [employer] said they did not see a tier exception on the patient's profile.  An email was sent to [facility] with [employer] to investigate the tier exception. Since it was a holiday, she said she could not properly access the system but said to contact her on Wednesday 06/15/17 for more information.  Patient's husband communicated understanding.  He was unable to review all of the patient's medications at the time of our call because she manages her medications on her own. (He handles the finances.)  From initial financial review, patient appears to meet the financial criteria for [employer]'s program for Humira.    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  A letter will be routed to [facility] B[facility], CPhT to send to the patient via mail with the application attached.    Patient's husband requested the application be emailed to them.  A letter was sent to the patient via MyChart with a link to the application on line.    Plan:  Follow up with [employer] on the tier exception  Follow up with the patient after speaking with [employer].   Courtney Bray, PharmD, BCACP [facility] Clinical Pharmacist [phone]

## 2017-06-15 NOTE — Patient Outreach (Signed)
[facility](THN) Care Management  [facility]   06/15/2017  Courtney Bray Apr 29, 1951 [id number]  Subjective: Patient called me back to discuss medication assistance as well as the tier exception completed with [employer].  HIPAA identifiers were obtained.  Patient is a 66 year old female with sarcoidosis and osteoporosis.  She expressed concern with the Humira copay which is $240.00.  Objective:   Encounter Medications: Outpatient Encounter Medications as of 06/15/2017  Medication Sig Note  . clobetasol cream (TEMOVATE) 0.05 % Apply topically as needed.   Marland Kitchen. HUMIRA PEN 40 MG/0.8ML PNKT Inject every other week for sarcoidosis 10/21/2014: Received from: External Pharmacy  . triamcinolone cream (KENALOG) 0.1 % Apply topically 2 times daily prn itching.   . [DISCONTINUED] Clobetasol Prop Crea-Coal Tar (CLOBETAPLUS CREAM EX) Apply topically.   . [DISCONTINUED] Omega-3 Fatty Acids (FISH OIL) 1200 MG CAPS Take 2 capsules by mouth as needed.     No facility-administered encounter medications on file as of 06/15/2017.     Functional Status: In your present state of health, do you have any difficulty performing the following activities: 04/08/2017  Hearing? N  Vision? N  Difficulty concentrating or making decisions? N  Walking or climbing stairs? N  Dressing or bathing? N  Doing errands, shopping? N  Some recent data might be hidden    Fall/Depression Screening: Fall Risk  06/08/2017 04/08/2017 04/02/2016  Falls in the past year? No No Yes  Number falls in past yr: - - 1  Injury with Fall? - - No   PHQ 2/9 Scores 06/08/2017 04/08/2017 04/02/2016  PHQ - 2 Score 0 0 0      Assessment:  Drugs sorted by system:  Topical Clobetasol cream Triamcinolone cream (does not use both together)   Miscellaneous: Humira   Medication Review Findings:  Patient received the Humira Patient Assistance application that was sent to her.  Patient  understands she will need to provide proof of her income along with a signed application  Patient said she would bring the completed application to our office.  [employer] was called for an explanation about the patient's tier exception appeal.  Unfortunately, Humira is a specialty drug and cannot have a tier exception per MEDICARE rules. (This was explained to the patient)  Daryll Brodrystal Walker, CPhT has already mailed an application to the patient and will also contact the patient's provider for the provider section of the application.   Plan:  Follow up on the application in 1 week.   Beecher McardleKatina J. [name], PharmD, BCACP [facility] Clinical Pharmacist [phone]

## 2017-06-22 ENCOUNTER — Other Ambulatory Visit: Payer: Self-pay | Admitting: Pharmacist

## 2017-06-22 NOTE — Patient Outreach (Signed)
[facility](THN) Care Management  06/22/2017  Courtney NovelBrenda C Bray [birth year] [id number]   Mr. Courtney Bray called and said Courtney Bray called him and said they had the provider's portion of the application but did not have the patient's portion.  HIPAA identifiers were obtained.  Courtney Bray was called. Courtney reported we were waiting on the provider's portion to be faxed back to our office.  The provider must have faxed their portion directly to Courtney Bray.    Courtney confirmed the patient's portion of the application will be faxed today to Courtney Bray.  Plan: Courtney Bray will follow up on the patient's application.   Courtney Bray, PharmD, BCACP [facility] Clinical [facility] [phone]

## 2017-06-23 ENCOUNTER — Ambulatory Visit: Payer: Self-pay | Admitting: Pharmacist

## 2017-06-28 ENCOUNTER — Other Ambulatory Visit: Payer: Self-pay | Admitting: Pharmacy Technician

## 2017-06-28 ENCOUNTER — Encounter: Payer: Self-pay | Admitting: Pharmacist

## 2017-06-28 NOTE — Patient Outreach (Signed)
[facility](THN) Care Management  06/28/2017  Courtney NovelBrenda C Bray Apr 20, 1951 [id number]   Successful patient outreach call in reference to [employer] patient assistance application for Humira. HIPAA identifiers verified and verbal consent received. Since I am not listed as an advocate on the application I contacted the patient so that we could do a conference call with the company. The patient had already received the letter of approval from [employer] and she is approved for the year. She will contact them when she needs her medication. I will send Courtney Bray, Courtney Bray a message for case closure.  Courtney Bray, Courtney Bray [facility] [phone]

## 2017-06-30 ENCOUNTER — Encounter: Payer: Self-pay | Admitting: Pulmonary Disease

## 2017-06-30 ENCOUNTER — Ambulatory Visit (INDEPENDENT_AMBULATORY_CARE_PROVIDER_SITE_OTHER)
Admission: RE | Admit: 2017-06-30 | Discharge: 2017-06-30 | Disposition: A | Discharge status: Home / Self Care | Payer: PPO | Source: Ambulatory Visit | Attending: Pulmonary Disease | Admitting: Pulmonary Disease

## 2017-06-30 ENCOUNTER — Other Ambulatory Visit: Payer: PPO

## 2017-06-30 ENCOUNTER — Ambulatory Visit: Payer: PPO | Admitting: Pulmonary Disease

## 2017-06-30 VITALS — BP 128/80 | HR 81 | Temp 98.0°F | Ht 61.5 in | Wt 115.0 lb

## 2017-06-30 DIAGNOSIS — J849 Interstitial pulmonary disease, unspecified: Secondary | ICD-10-CM | POA: Diagnosis not present

## 2017-06-30 DIAGNOSIS — D860 Sarcoidosis of lung: Secondary | ICD-10-CM | POA: Diagnosis not present

## 2017-06-30 DIAGNOSIS — D863 Sarcoidosis of skin: Secondary | ICD-10-CM

## 2017-06-30 NOTE — Progress Notes (Signed)
Subjective:     Patient ID: Courtney Bray, female   DOB: December 21, 1950, 67 y.o.   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     TBBx RLL by DrClance 1994 showed noncaseating granulomatous inflamm c/w sarcoidosis...  CT Chest scans from 2002=>2004 showed bilat hilar & mediastinal adenopathy w/ interstitial lung changes of her known sarcoidosis; bilat upper lobe nodules R>L are unchanged serially likely representing sarcoidosis as well...   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  PFTs 03/15/06 showed FVC=2.59 (88%), FEV1=1.98 (89%), %1sec=76, mid-flows reduced at 61% predicted; TLC=3.45 (76%), RV=0.86 (53%), RV/TLC=25%; DLCO=101% predicted...  CXRs 2008=>2011 showed bilat hilar prom & stable RUL opac w/ scarring & retraction of the hilum...  PFTs 09/25/08 showed FVC=2.56 (88%), FEV1=2.06 (95%), %1sec=81, mid-flows wnl at 83% predicted; TLC=3.93 (87%), RV=1.26 (76%), RV/TLC=32%,  DLCO=89% predicted...    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 This primarily manifest as cough, and also skin involvement. She is being followed closely  at [facility] for her cutaneous sarcoid, and is being managed on Humira at this point. She has done very well with the medication, and it has also helped control her cough. The patient is very satisfied with her exertional tolerance at this time, and denies any issues with shortness of breath. As stated above, her cough has not been a problem since being on the Humira. She has not had a recent chest x-ray or pulmonary function studies.      IMP>  The patient is doing very well from a pulmonary standpoint. She is having no significant cough or dyspnea on exertion. I suspect the Humira is helping her pulmonary disease as well as her skin disease. She does need to have a followup chest x-ray and pulmonary function studies, and we will schedule these today (not done). I have asked her to stay as active as possible, and to call as if she sees any change in her breathing. She will followup with me in one year.      REC>  Will check cxr today, and call you with results. Please schedule followup breathing studies at your convenience over the next 1-2 mos. Will call you with results.  Followup with me in one year if doing well  CXR 08/31/11> IMP: History of sarcoidosis. Hilar prominence right greater than left may reflect adenopathy. Appear stable. Stable appearance of slight prominence of reticular interstitial markings. No new lesion is evident.  ~  03/15/13:  ROV w/ KC>        Patient comes in today for followup of her known sarcoidosis. This primarily manifests as  skin changes, as well as cough. She is being treated with Humira by her dermatologist, and this has also controlled her cough very well. She reports no shortness of breath, and has been staying very active. Her chest x-ray last year was totally stable, and her breathing test today are stable as well.      IMP>  The patient is doing very well from a sarcoid standpoint, with no increasing shortness of breath or decreased exertional  tolerance. Her x-ray has been stable, and although her total lung capacity it is a little lower, her diffusion capacity is completely normal and stable. Her cough and skin changes are totally controlled on Humira through her dermatologist. Overall, she is satisfied with her symptoms, and I have encouraged her to continue on an aggressive exercise and conditioning program. I would like to see her back in one year, but do not feel that she needs yearly x-rays and PFTs. Will simply do these periodically and as needed.      REC>  Your breathing studies are stable.Good news.Will check xrays and PFT's periodically rather than yearly. Stay as active as possible. Make sure you get your flu shot this fall. Follow up with me in one year, but please call if having increased symptoms.  PFT 03/15/13>  FVC=2.36 (103%), FEV1=1.94 (108%), %1sec=82, mid-flows wnl at 116% predicted;  TLC=3.07 (66%), RV=1.18 (62%), RV/TLC=39; DLCO=91% predicted...   ~  04/22/14:  ROV w/ KC>        The patient comes in today for follow-up of her known sarcoidosis. This is primarily manifesting as skin involvement, and the patient is on Humira through her dermatologist with significant control of pulmonary symptoms and skin changes. She denies any cough, and has had excellent exertional tolerance. She feels that she is asymptomatic from a pulmonary standpoint.      IMP>  The patient is doing very well from a pulmonary standpoint with respect to her sarcoidosis. She is continuing treatment by dermatology for her skin changes, and this in turn has resolved her pulmonary symptoms as well. I would like to see her back in one year to check on things, but she is to call if she has increased symptoms. I will check a chest x-ray at her next visit.      REC>  Continue on your medication thru your dermatologist. Please call if you feel you are having increased pulmonary symptoms. Followup with me in one year, and will check a chest xray the same day.    ~  May 29, 2015:  1year ROV w/ SN>  Her PCP= DrPanosh => last seen 10/16 & they checked PPD skin test= neg (but control not used and some sarcoid pts are anergic).    Today I met Courtney Bray for the first time, reviewed her history, and composed this EPIC chart review... She notes min cough, no sput, denies SOB/ CP, no palpit or edema;  she is active- exercises, walking, does stairs ok, and not limited in any way...     Pulmonary sarcoidosis> Dx 1994 via TBBx showing noncaseating granulomatous inflammation; CT Chest scans showed bilat hilar & mediastinal adenopathy w/ interstitial lung changes w/ bilat upper lobe nodules R>L; PFTs were +/- wnl (poss mild restriction) and minimal pulmonary symptomatology;  She has never required treatment for her pulm dis and ACE levels were not followed...    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 EXAM shows  Afeb, VSS, O2sat=97%;  HEENT- neg, mallampati1;  Chest- clear w/o w/r/r;  Heart- RR Gr1/6 SEM w/o r/g;  Abd- soft, neg;  Ext- neg w/o c/c/e;  Neuro- intact;  Derm- wig covers bald spot on scalp, prev plaques resolved.  CXR 05/29/15 showed norm heart size, stable coarse interstitial markings w/ R>L hilar enlargement & right perihilar opac & vol loss; no active infiltrate or edema; no change from 2013 films...  LABS 10/16 by DrPanosh showed FLP- ok;  Chems- wnl including LFTs;  CBC- wnl x WBC=3.4;  TSH=1.35  LABS 05/29/15:  ACE level = 42 (8-52) indicative of inactive sarcoid... IMP/PLAN>>  Imanie remains essentially asymptomatic from the pulmonary standpoint; her CXR is stable and ACE level is wnl at 42; she remains on Humira for cutaneous sarcoid under the direction of [facility]; we discussed ROV in 4mo  ~  May 31, 2016:  Yearly ROV & f/u sarcoidosis>  Courtney Bray for a routine 1734yrOV & f/u Sarcoid- states doing well, no new complaints or concerns, she's had a good yr;  She  [facility] w/ a good response...    [facility] & she is due for f/u soon>  Few papules on dorsal hands otherw neg; she is on Humira 4066mowk, clobetasol, & kenalog as needed; no changes made...    She sees DrPanosh for PCP & had Medicare wellness exam 04/02/16> note reviewed, doing satis...    [facility]- felt to be reactive w/o any evid for lymphoprolif disorder; WBCs ranging 3.1 to 4.1, no change since records avail back to 2008, and f/u suggested prn... We reviewed the following medical problems during today's office visit >>     Pulmonary sarcoidosis> Dx 1994 via TBBx showing noncaseating granulomatous inflammation; CT Chest scans showed bilat hilar & mediastinal adenopathy w/ interstitial lung changes w/ bilat upper lobe nodules R>L; PFTs were +/- wnl (poss mild restriction) and minimal pulmonary symptomatology;  She has never required treatment for her pulm dis and ACE levels were not followed... She remains asymptomatic.    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 EXAM shows Afeb, VSS, O2sat=98%;  HEENT- neg, mallampati1;  Chest- clear w/o w/r/r;  Heart- RR Gr1/6 SEM w/o r/g;  Abd- soft, neg;  Ext- neg w/o c/c/e;  Neuro- intact;  Derm- wig covers bald spot on scalp, prev plaques resolved.  CXR 05/31/16 (independently reviewed by me in the PACS system) showed norm heart size, some calcif in the wall of the Ao arch, diffuse incr interstitial markings as before- no change, prom right hilar structures, chr changes c/w sarcoid- NAD...  Labs 05/31/16>  ACE level= 39 IMP/PLAN>>  Hx pulm & cutaneous sarcoid- stable & doing well on Humira shots every other week;  She remains asymptomatic from the pulm standpoint & has never required specific therapy for lung involvement; she knows  to watch for incr cough/ SOB/ chest discomfort & call me anytime... We plan routine ROV w/ CXR & ACE level in 34yr84yr  ~  June 30, 2017:  Yearly ROV & f/u sarcoidosis>  BrenKhristenurns feeling well, reports a good year, no new complaints or concerns; she notes min cough, no sput or blood, denies SOB & exercises by walking (& shoveled snow!), housework, & caring for her elderly mother; she is  not limited in activities in any way; denies CP, palpit, dizzy, edema, etc... SHE REMAINS ON HUMIRA (53m every other week) PER WEagar(05/2016 note reiewed) for hx cutaneous sarcoid w/ yearly follow up visits... We reviewed the following medical problems during today's office visit >>     Pulmonary sarcoidosis> Dx 1994 via TBBx showing noncaseating granulomatous inflammation; CT Chest scans showed bilat hilar & mediastinal adenopathy w/ interstitial lung changes w/ bilat upper lobe nodules R>L; PFTs were +/- wnl (poss mild restriction) and minimal pulmonary symptomatology;  She has never required treatment for her pulm dis and ACE levels have since all been wnl... She remains asymptomatic.    Cutaneous sarcoidosis> [facility] on scalp Bx 2006 [facility] doing well on this [facility]... EXAM shows Afeb, VSS, O2sat=97%;  HEENT- neg, mallampati1;  Chest- clear w/o w/r/r;  Heart- RR Gr1/6 SEM w/o r/g;  Abd- soft, neg;  Ext- neg w/o c/c/e;  Neuro- intact;  Derm- wig covers bald spot on scalp, prev plaques resolved.  CXR 06/30/17> norm heart size, bilat hilar fullness and incr interstitial markings & scarring in RUL- no interval changes compared to 2017...  LABS 06/30/17> ACE level = 29 (9-67) IMP/PLAN>>  BNyleahhas Sarcoidosis & is treated w/ Humira & a topical cream for her cutaneous dis;  She remains asymptomatic from the pulmonary standpoint and her CXR remains unchanged withg a norm ACE level;  Felt to have inactive lung dis & she is advised to exercise regularly and continue the  Humira per DERM;  We will plan ROV recheck in 157yrsooner if needed for any resp symptoms...     Past Medical History:  Diagnosis Date  . Chicken pox   . Lymphocytosis   . Osteoporosis   . Sarcoidosis 02/24/2007   Dxed 1994 by TBBX + ISLD/LN on cxr Primarily manifests as cough and skin changes [facility] PFT's 2010:  No obstruction, TLC 3.93 (87%), DLCO 89%. PFT's 2014:  No obstruction, TLC 3.07, DLCO 91%      Past Surgical History:  Procedure Laterality Date  . TUBAL LIGATION  1979  . UTERINE ARTERY EMBOLIZATION  2000    Outpatient Encounter Medications as of 06/30/2017  Medication Sig  . clobetasol cream (TEMOVATE) 0.05 % Apply topically as needed.  . Marland KitchenUMIRA PEN 40 MG/0.8ML PNKT Inject every other week for sarcoidosis  . triamcinolone cream (KENALOG) 0.1 % Apply topically 2 times daily prn itching.   No facility-administered encounter medications on file as of 06/30/2017.     Allergies  Allergen Reactions  . Vitamin D Analogs Rash    high dose  Pill had rash     Immunization History  Administered Date(s) Administered  . PPD Test 05/11/2013, 04/01/2014, 04/01/2015, 05/29/2015, 04/08/2017, 05/02/2017  . Pneumococcal Conjugate-13 04/02/2016  . Pneumococcal Polysaccharide-23 09/05/2012  . Tdap 02/20/2007  . Zoster 04/22/2011    Current Medications, Allergies, Past Medical History, Past Surgical History, Family History, and Social History were reviewed in [employer].   Review of Systems             All symptoms NEG except where BOLDED >>  Constitutional:  F/C/S, fatigue, anorexia, unexpected weight change. HEENT:  HA, visual changes, hearing loss, earache, nasal symptoms, sore throat, mouth sores, hoarseness. Resp:  cough, sputum, hemoptysis; SOB, tightness, wheezing. Cardio:  CP, palpit, DOE, orthopnea, edema. GI:  N/V/D/C, blood in stool; reflux, abd pain, distention,  gas. GU:  dysuria, freq, urgency, hematuria,  flank pain, voiding difficulty. MS:  joint pain, swelling, tenderness, decr ROM; neck pain, back pain, etc. Neuro:  HA, tremors, seizures, dizziness, syncope, weakness, numbness, gait abn. Skin:  suspicious lesions or skin rash, scalp plaques have improved on Rx. Heme:  adenopathy, bruising, bleeding. Psyche:  confusion, agitation, sleep disturbance, hallucinations, anxiety, depression suicidal.   Objective:   Physical Exam       Vital Signs:  Reviewed...   General:  WD, WN, 67 y/o F in NAD; alert & oriented; pleasant & cooperative... HEENT:  [place]/AT; Conjunctiva- pink, Sclera- nonicteric, EOM-wnl, PERRLA, Fundi-benign; EACs-clear, TMs-wnl; NOSE-clear; THROAT-clear & wnl.  Neck:  Supple w/ fair ROM; no JVD; normal carotid impulses w/o bruits; no thyromegaly or nodules palpated; no lymphadenopathy palpated. Chest:  Clear to P & A; without wheezes, rales, or rhonchi heard. Heart:  Regular Rhythm; norm S1 & S2 Gr1/6 SEM w/o rubs or gallops detected. Abdomen:  Soft & nontender- no guarding or rebound; normal bowel sounds; no organomegaly or masses palpated. Ext:  Normal ROM; without deformities or arthritic changes; no varicose veins, venous insuffic, or edema;  Pulses intact w/o bruits. Neuro:  CNs II-XII intact; motor testing normal; sensory testing normal; gait normal & balance OK. Derm:  No lesions noted; no rash etc; she is wearing a wig that covers her bald spot. Lymph:  No cervical, supraclavicular, axillary, or inguinal adenopathy palpated.   Assessment:      IMP >>     Pulmonary sarcoidosis>  She remains essentially asymptomatic, CXR is stable, ACE level is wnl;  We will continue to follow...    Cutaneous sarcoidosis>  Followed by Kennieth Rad on Humira and improved, no new skin lesons...    Medical issues:  Followed by DrPanosh.  PLAN >>  05/29/15>   Courtney Bray remains essentially asymptomatic from the pulmonary standpoint; her CXR is stable and ACE level is wnl at 42; she remains on  Humira for cutaneous sarcoid under the direction of [facility]... 05/31/16>   Hx pulm & cutaneous sarcoid- stable & doing well on Humira shots every other week;  She remains asymptomatic from the pulm standpoint & has never required specific therapy for lung involvement; she knows to watch for incr cough/ SOB/ chest discomfort & call me anytime... We plan routine ROV w/ CXR & ACE level in 51yr.. 06/30/17>   Courtney Bray Sarcoidosis & is treated w/ Humira & a topical cream for her cutaneous dis;  She remains asymptomatic from the pulmonary standpoint and her CXR remains unchanged withg a norm ACE level;  Felt to have inactive lung dis & she is advised to exercise regularly and continue the Humira per DERM;  We will plan ROV recheck in 184yrsooner if needed for any resp symptoms     Plan:     Patient's Medications  New Prescriptions   No medications on file  Previous Medications   CLOBETASOL CREAM (TEMOVATE) 0.05 %    Apply topically as needed.   HUMIRA PEN 40 MG/0.8ML PNKT    Inject every other week for sarcoidosis   TRIAMCINOLONE CREAM (KENALOG) 0.1 %    Apply topically 2 times daily prn itching.  Modified Medications   No medications on file  Discontinued Medications   No medications on file

## 2017-06-30 NOTE — Patient Instructions (Signed)
Today we updated your med list in our EPIC system...    Continue your current medications the same...  Today we rechecked your CXR & sarcoid blood test (ACE level)...    We will contact you w/ the results when available...   Keep up the good work w/ your exercise program...  Call for any questions...  Let's plan a follow up visit in 1015yr, sooner if needed for any respiratory problems.Marland Kitchen..Marland Kitchen

## 2017-07-01 LAB — ANGIOTENSIN CONVERTING ENZYME: ANGIOTENSIN-CONVERTING ENZYME: 29 U/L (ref 9–67)

## 2017-07-27 ENCOUNTER — Telehealth: Payer: Self-pay | Admitting: Family Medicine

## 2017-07-27 MED ORDER — OSELTAMIVIR PHOSPHATE 75 MG PO CAPS: 75.0000 mg | ORAL_CAPSULE | Freq: Two times a day (BID) | ORAL | 0 refills | Status: DC

## 2017-07-27 NOTE — Telephone Encounter (Signed)
Called and spoke with pt and this medication was refilled today by another provider

## 2017-07-27 NOTE — Telephone Encounter (Signed)
Copied from CRM [phone]#[id number]. Topic: Quick Communication - Rx Refill/Question >> Jul 27, 2017 12:18 PM Darletta MollLander, Lumin L wrote: Medication: tamiflu Has the patient contacted their pharmacy? Yes.  (Agent: If no, request that the patient contact the pharmacy for the refill.) Preferred Pharmacy (with phone number or street name): [facility] [address] Agent: Please be advised that RX refills may take up to 3 business days. We ask that you follow-up with your pharmacy. Patient script not at pharm It was sent to today [facility] [address] [facility]

## 2017-07-27 NOTE — Telephone Encounter (Signed)
She is taking care of her mother, who tested positive for influenza in our clinic today. We will go ahead and prophylax her with 5 days of Tamiflu.

## 2017-08-09 ENCOUNTER — Encounter: Payer: Self-pay | Admitting: Internal Medicine

## 2017-08-09 ENCOUNTER — Ambulatory Visit (INDEPENDENT_AMBULATORY_CARE_PROVIDER_SITE_OTHER): Payer: PPO | Admitting: Internal Medicine

## 2017-08-09 VITALS — BP 138/82 | HR 93 | Temp 98.5°F | Wt 114.6 lb

## 2017-08-09 DIAGNOSIS — L290 Pruritus ani: Secondary | ICD-10-CM | POA: Diagnosis not present

## 2017-08-09 DIAGNOSIS — K649 Unspecified hemorrhoids: Secondary | ICD-10-CM

## 2017-08-09 DIAGNOSIS — L80 Vitiligo: Secondary | ICD-10-CM

## 2017-08-09 NOTE — Progress Notes (Signed)
Chief Complaint  Patient presents with  . Hemorrhoids    reocurring, no bleeding, rectal itching and burning,     HPI: Courtney Bray 67 y.o.   sda     Last seen pv in fall 18  Has underlying sarcoid    Going on a while   And better and then worse recently   Second time flared up.  Irritation on skin around  Even when urinating.  No vaginally .  Soaking and using  Numbing product  A and d and oilke oil  Some help..  Pain with bm.    Last colon 2010 having  Irritation and itching burning   ROS: See pertinent positives and negatives per HPI.  Past Medical History:  Diagnosis Date  . Chicken pox   . Lymphocytosis   . Osteoporosis   . 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  PFT's 2010:  No obstruction, TLC 3.93 (87%), DLCO 89%. PFT's 2014:  No obstruction, TLC 3.07, DLCO 91%      Family History  Problem Relation Age of Onset  . Heart disease Father   . Stroke Father   . Dementia Father   . Dementia Mother   . High blood pressure Mother   . High blood pressure Sister        with hypertesnive renal disease    Social History   Socioeconomic History  . Marital status: Married    Spouse name: None  . Number of children: None  . Years of education: None  . Highest education level: None  Social Needs  . Financial resource strain: None  . Food insecurity - worry: None  . Food insecurity - inability: None  . Transportation needs - medical: None  . Transportation needs - non-medical: None  Occupational History  . None  Tobacco Use  . Smoking status: Never Smoker  . Smokeless tobacco: Never Used  Substance and Sexual Activity  . Alcohol use: Yes    Alcohol/week: 0.0 oz    Comment: Occasional  . Drug use: No  . Sexual activity: Yes    Partners: Male  Other Topics Concern  . None  Social History Narrative   5-6 hours of sleep per night   Lives with her husband who  is retired Engineer, 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   Orig from [place]   in [place] since 1988   [name]     Outpatient Medications Prior to Visit  Medication Sig Dispense Refill  . clobetasol cream (TEMOVATE) 0.05 % Apply topically as needed.    Marland Kitchen HUMIRA PEN 40 MG/0.8ML PNKT Inject every other week for sarcoidosis    . oseltamivir (TAMIFLU) 75 MG capsule Take 1 capsule (75 mg total) by mouth 2 (two) times daily. (Patient not taking: Reported on 08/09/2017) 10 capsule 0  . triamcinolone cream (KENALOG) 0.1 % Apply topically 2 times daily prn itching.     No facility-administered medications prior to visit.      EXAM:  BP 138/82 (BP Location: Left Arm, Patient Position: Sitting, Cuff Size: Normal)   Pulse 93   Temp 98.5 F (36.9 C) (  Oral)   Wt 114 lb 9.6 oz (52 kg)   BMI 21.30 kg/m   Body mass index is 21.3 kg/m.  GENERAL: vitals reviewed and listed above, alert, oriented, appears well hydrated and in no acute distress HEENT: atraumatic, conjunctiva  clear, no obvious abnormalities on inspection of external nose and ears  NECK: no obvious masses on inspection palpation  abd soft no adenopathy  MS: moves all extremities without noticeable focal  Abnormality EXT GU rectal   Vitiligo      Posterior perineal area  Rectal no masses    But grapes of  hemorrhoids external   No blood  Irritated  Post  Fissure and redness  PSYCH: pleasant and cooperative, no obvious depression or anxiety  ASSESSMENT AND PLAN:  Discussed the following assessment and plan:  Hemorrhoids, unspecified hemorrhoid type  Rectal itching  Vitiligo   Last colon 2010 in humira   No ob alarming issues but may need to see gi  .  See advice  Local care monistat and  hc isf needed and  Zinc oxide area  -Patient advised to return or  notify health care team  if symptoms worsen ,persist or new concerns arise.  Patient Instructions  I agree this is hemorrhoids plus irritation around the anal rectal area. There could be some mild yeast infection in the area. Use over-the-counter Monistat generic is okay twice a day for 1-2 weeks to see if it decreases the itching you can use 1% hydrocortisone as needed but not long-term.  Gentle cleaning and use a product with zinc oxide generously something like but paste or Desitin ointment not cream to cover the area and protect it.  Limit the use of the topical pain medicine sometimes it irritates more. I advise get an appointment with your GI doctor for follow-up if ongoing which it may do   Hemorrhoids Hemorrhoids are swollen veins in and around the rectum or anus. There are two types of hemorrhoids:  Internal hemorrhoids. These occur in the veins that are just inside the rectum. They may poke through to the outside and become irritated and painful.  External hemorrhoids. These occur in the veins that are outside of the anus and can be felt as a painful swelling or hard lump near the anus.  Most hemorrhoids do not cause serious problems, and they can be managed with home treatments such as diet and lifestyle changes. If home treatments do not help your symptoms, procedures can be done to shrink or remove the hemorrhoids. What are the causes? This condition is caused by increased pressure in the anal area. This pressure may result from various things, including:  Constipation.  Straining to have a bowel movement.  Diarrhea.  Pregnancy.  Obesity.  Sitting for long periods of time.  Heavy lifting or other activity that causes you to strain.  Anal sex.  What are the signs or symptoms? Symptoms of this condition include:  Pain.  Anal itching or irritation.  Rectal bleeding.  Leakage of stool (feces).  Anal swelling.  One or more lumps around the anus.  How is  this diagnosed? This condition can often be diagnosed through a visual exam. Other exams or tests may also be done, such as:  Examination of the rectal area with a gloved hand (digital rectal exam).  Examination of the anal canal using a small tube (anoscope).  A blood test, if you have lost a significant amount of blood.  A test to look inside the colon (  sigmoidoscopy or colonoscopy).  How is this treated? This condition can usually be treated at home. However, various procedures may be done if dietary changes, lifestyle changes, and other home treatments do not help your symptoms. These procedures can help make the hemorrhoids smaller or remove them completely. Some of these procedures involve surgery, and others do not. Common procedures include:  Rubber band ligation. Rubber bands are placed at the base of the hemorrhoids to cut off the blood supply to them.  Sclerotherapy. Medicine is injected into the hemorrhoids to shrink them.  Infrared coagulation. A type of light energy is used to get rid of the hemorrhoids.  Hemorrhoidectomy surgery. The hemorrhoids are surgically removed, and the veins that supply them are tied off.  Stapled hemorrhoidopexy surgery. A circular stapling device is used to remove the hemorrhoids and use staples to cut off the blood supply to them.  Follow these instructions at home: Eating and drinking  Eat foods that have a lot of fiber in them, such as whole grains, beans, nuts, fruits, and vegetables. Ask your health care provider about taking products that have added fiber (fiber supplements).  Drink enough fluid to keep your urine clear or pale yellow. Managing pain and swelling  Take warm sitz baths for 20 minutes, 3-4 times a day to ease pain and discomfort.  If directed, apply ice to the affected area. Using ice packs between sitz baths may be helpful. ? Put ice in a plastic bag. ? Place a towel between your skin and the bag. ? Leave the ice on  for 20 minutes, 2-3 times a day. General instructions  Take over-the-counter and prescription medicines only as told by your health care provider.  Use medicated creams or suppositories as told.  Exercise regularly.  Go to the bathroom when you have the urge to have a bowel movement. Do not wait.  Avoid straining to have bowel movements.  Keep the anal area dry and clean. Use wet toilet paper or moist towelettes after a bowel movement.  Do not sit on the toilet for long periods of time. This increases blood pooling and pain. Contact a health care provider if:  You have increasing pain and swelling that are not controlled by treatment or medicine.  You have uncontrolled bleeding.  You have difficulty having a bowel movement, or you are unable to have a bowel movement.  You have pain or inflammation outside the area of the hemorrhoids. This information is not intended to replace advice given to you by your health care provider. Make sure you discuss any questions you have with your health care provider. Document Released: 06/04/2000 Document Revised: 11/05/2015 Document Reviewed: 02/19/2015 Elsevier Interactive Patient Education  2018 [employer].   Anal Pruritus Anal pruritus is an itchy feeling in the anus and the skin in the anal area. This is common and can be caused by many things. It often occurs when the area becomes moist. Moisture may be due to sweating or a small amount of stool (feces) that is left on the area because of poor personal cleaning. Some other causes include:  Perfumed soaps and sprays.  Colored toilet paper.  Chemicals in the foods that you eat.  Dietary factors, such as caffeine, beer, milk products, chocolate, nuts, citrus fruits, tomatoes, spicy seasonings, jalapeno peppers, and salsa.  Hemorrhoids, fissures, infections, and other anal diseases.  Excessive washing.  Overuse of laxatives.  Skin disorders (psoriasis, eczema, or  seborrhea).  Some medical disorders, such as diabetes or  thyroid problems.  Diarrhea.  STDs (sexually transmitted diseases).  Some cancers.  In many cases, the cause is not known. The itching usually goes away with treatment and home care. Scratching can cause further skin damage. Follow these instructions at home: Pay attention to any changes in your symptoms. Take these actions to help with your itching: Skin Care  Practice good hygiene. ? Clean the anal area gently with wet toilet paper, baby wipes, or a wet washcloth after every bowel movement and at bedtime. ? Avoid using soaps on the anal area. ? Dry the area thoroughly. Pat the area dry with toilet paper or a towel.  Do not scrub the anal area with anything, including toilet paper.  Do not scratch the itchy area. Scratching produces more damage and makes the itching worse.  Take sitz baths in warm water as told by your health care provider. Pat the area dry with a soft cloth after each bath.  Use creams or ointments as told by your health care provider. Zinc oxide ointment or a moisture barrier cream can be applied several times per day to protect the skin.  Do not use anything that irritates the skin, such as bubble baths, scented toilet paper, or genital deodorants. General instructions  Take over-the-counter and prescription medicines only as told by your health care provider.  Talk with your health care provider about fiber supplements. These are helpful in keeping your stool normal if you have frequent loose stools.  Wear cotton underwear and loose clothing.  Keep all follow-up visits as told by your health care provider. This is important. Contact a health care provider if:  Your itching does not improve in several days.  Your itching gets worse.  You have a fever.  You have redness, swelling, or pain in the anal area.  You have fluid, blood, or pus coming from the anal area. This information is not  intended to replace advice given to you by your health care provider. Make sure you discuss any questions you have with your health care provider. Document Released: 12/07/2010 Document Revised: 11/13/2015 Document Reviewed: 09/02/2014 Elsevier Interactive Patient Education  2018 [employer].        [place]. Tanith Dagostino M.D.

## 2017-08-09 NOTE — Patient Instructions (Signed)
I agree this is hemorrhoids plus irritation around the anal rectal area. There could be some mild yeast infection in the area. Use over-the-counter Monistat generic is okay twice a day for 1-2 weeks to see if it decreases the itching you can use 1% hydrocortisone as needed but not long-term.  Gentle cleaning and use a product with zinc oxide generously something like but paste or Desitin ointment not cream to cover the area and protect it.  Limit the use of the topical pain medicine sometimes it irritates more. I advise get an appointment with your GI doctor for follow-up if ongoing which it may do   Hemorrhoids Hemorrhoids are swollen veins in and around the rectum or anus. There are two types of hemorrhoids:  Internal hemorrhoids. These occur in the veins that are just inside the rectum. They may poke through to the outside and become irritated and painful.  External hemorrhoids. These occur in the veins that are outside of the anus and can be felt as a painful swelling or hard lump near the anus.  Most hemorrhoids do not cause serious problems, and they can be managed with home treatments such as diet and lifestyle changes. If home treatments do not help your symptoms, procedures can be done to shrink or remove the hemorrhoids. What are the causes? This condition is caused by increased pressure in the anal area. This pressure may result from various things, including:  Constipation.  Straining to have a bowel movement.  Diarrhea.  Pregnancy.  Obesity.  Sitting for long periods of time.  Heavy lifting or other activity that causes you to strain.  Anal sex.  What are the signs or symptoms? Symptoms of this condition include:  Pain.  Anal itching or irritation.  Rectal bleeding.  Leakage of stool (feces).  Anal swelling.  One or more lumps around the anus.  How is this diagnosed? This condition can often be diagnosed through a visual exam. Other exams or tests may  also be done, such as:  Examination of the rectal area with a gloved hand (digital rectal exam).  Examination of the anal canal using a small tube (anoscope).  A blood test, if you have lost a significant amount of blood.  A test to look inside the colon (sigmoidoscopy or colonoscopy).  How is this treated? This condition can usually be treated at home. However, various procedures may be done if dietary changes, lifestyle changes, and other home treatments do not help your symptoms. These procedures can help make the hemorrhoids smaller or remove them completely. Some of these procedures involve surgery, and others do not. Common procedures include:  Rubber band ligation. Rubber bands are placed at the base of the hemorrhoids to cut off the blood supply to them.  Sclerotherapy. Medicine is injected into the hemorrhoids to shrink them.  Infrared coagulation. A type of light energy is used to get rid of the hemorrhoids.  Hemorrhoidectomy surgery. The hemorrhoids are surgically removed, and the veins that supply them are tied off.  Stapled hemorrhoidopexy surgery. A circular stapling device is used to remove the hemorrhoids and use staples to cut off the blood supply to them.  Follow these instructions at home: Eating and drinking  Eat foods that have a lot of fiber in them, such as whole grains, beans, nuts, fruits, and vegetables. Ask your health care provider about taking products that have added fiber (fiber supplements).  Drink enough fluid to keep your urine clear or pale yellow. Managing pain and  swelling  Take warm sitz baths for 20 minutes, 3-4 times a day to ease pain and discomfort.  If directed, apply ice to the affected area. Using ice packs between sitz baths may be helpful. ? Put ice in a plastic bag. ? Place a towel between your skin and the bag. ? Leave the ice on for 20 minutes, 2-3 times a day. General instructions  Take over-the-counter and prescription  medicines only as told by your health care provider.  Use medicated creams or suppositories as told.  Exercise regularly.  Go to the bathroom when you have the urge to have a bowel movement. Do not wait.  Avoid straining to have bowel movements.  Keep the anal area dry and clean. Use wet toilet paper or moist towelettes after a bowel movement.  Do not sit on the toilet for long periods of time. This increases blood pooling and pain. Contact a health care provider if:  You have increasing pain and swelling that are not controlled by treatment or medicine.  You have uncontrolled bleeding.  You have difficulty having a bowel movement, or you are unable to have a bowel movement.  You have pain or inflammation outside the area of the hemorrhoids. This information is not intended to replace advice given to you by your health care provider. Make sure you discuss any questions you have with your health care provider. Document Released: 06/04/2000 Document Revised: 11/05/2015 Document Reviewed: 02/19/2015 Elsevier Interactive Patient Education  2018 [employer].   Anal Pruritus Anal pruritus is an itchy feeling in the anus and the skin in the anal area. This is common and can be caused by many things. It often occurs when the area becomes moist. Moisture may be due to sweating or a small amount of stool (feces) that is left on the area because of poor personal cleaning. Some other causes include:  Perfumed soaps and sprays.  Colored toilet paper.  Chemicals in the foods that you eat.  Dietary factors, such as caffeine, beer, milk products, chocolate, nuts, citrus fruits, tomatoes, spicy seasonings, jalapeno peppers, and salsa.  Hemorrhoids, fissures, infections, and other anal diseases.  Excessive washing.  Overuse of laxatives.  Skin disorders (psoriasis, eczema, or seborrhea).  Some medical disorders, such as diabetes or thyroid problems.  Diarrhea.  STDs (sexually  transmitted diseases).  Some cancers.  In many cases, the cause is not known. The itching usually goes away with treatment and home care. Scratching can cause further skin damage. Follow these instructions at home: Pay attention to any changes in your symptoms. Take these actions to help with your itching: Skin Care  Practice good hygiene. ? Clean the anal area gently with wet toilet paper, baby wipes, or a wet washcloth after every bowel movement and at bedtime. ? Avoid using soaps on the anal area. ? Dry the area thoroughly. Pat the area dry with toilet paper or a towel.  Do not scrub the anal area with anything, including toilet paper.  Do not scratch the itchy area. Scratching produces more damage and makes the itching worse.  Take sitz baths in warm water as told by your health care provider. Pat the area dry with a soft cloth after each bath.  Use creams or ointments as told by your health care provider. Zinc oxide ointment or a moisture barrier cream can be applied several times per day to protect the skin.  Do not use anything that irritates the skin, such as bubble baths, scented toilet paper,  or genital deodorants. General instructions  Take over-the-counter and prescription medicines only as told by your health care provider.  Talk with your health care provider about fiber supplements. These are helpful in keeping your stool normal if you have frequent loose stools.  Wear cotton underwear and loose clothing.  Keep all follow-up visits as told by your health care provider. This is important. Contact a health care provider if:  Your itching does not improve in several days.  Your itching gets worse.  You have a fever.  You have redness, swelling, or pain in the anal area.  You have fluid, blood, or pus coming from the anal area. This information is not intended to replace advice given to you by your health care provider. Make sure you discuss any questions you  have with your health care provider. Document Released: 12/07/2010 Document Revised: 11/13/2015 Document Reviewed: 09/02/2014 Elsevier Interactive Patient Education  [employer].

## 2017-10-18 NOTE — Progress Notes (Addendum)
Subjective:   Courtney Bray is a 67 y.o. female who presents for an Initial Medicare Annual Wellness Visit.  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 Hepatitis C Screening  04/17/1951  . TETANUS/TDAP  02/19/2017  . PNA vac Low Risk Adult (2 of 2 - PPSV23) 09/05/2017    Osteoporosis - does not want to repeat dexa at this time   Colonoscopy 01/2009 - may repeat in 2020  Mammogram annual  dexa 05/2015  -2.6 / Dr. Fabian Sharp is checking   Cardiac Risk Factors include: advanced age (>62men, >1 women);family history of premature cardiovascular disease     Objective:    Today's Vitals   10/19/17 1014  BP: 132/80  Pulse: 83  SpO2: 98%  Weight: 112 lb 7 oz (51 kg)  Height: 5' 1.5" (1.562 m)   Body mass index is 20.9 kg/m.  Advanced Directives 10/19/2017 06/08/2017 11/19/2015 07/31/2015  Does Patient Have a Medical Advance Directive? Yes Yes No No  Type of Advance Directive - Healthcare Power of Attorney - -  Does patient want to make changes to medical advance directive? - No - Patient declined - -  Copy of Healthcare Power of Attorney in Chart? - No - copy requested - -  Would patient like information on creating a medical advance directive? - - Yes - Educational materials given Yes - Educational materials given    Current Medications (verified) Outpatient Encounter Medications as of 10/19/2017  Medication Sig  . clobetasol cream (TEMOVATE) 0.05 % Apply topically as needed.  Marland Kitchen HUMIRA PEN 40 MG/0.8ML PNKT Inject every other week for sarcoidosis    No facility-administered encounter medications on file as of 10/19/2017.     Allergies (verified) Amoxicillin and Vitamin d analogs   History: Past Medical History:  Diagnosis Date  . Chicken pox   . Lymphocytosis   . Osteoporosis   . 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  PFT's 2010:  No obstruction, TLC 3.93 (87%), DLCO 89%. PFT's 2014:  No obstruction, TLC 3.07, DLCO 91%     Past Surgical History:  Procedure Laterality Date  . TUBAL LIGATION  1979  . UTERINE ARTERY EMBOLIZATION  2000   Family History  Problem Relation Age of Onset  . Heart disease Father   . Stroke Father   . Dementia Father   . Dementia Mother   . High blood pressure Mother   . High blood pressure Sister        with hypertesnive renal disease   Social History   Socioeconomic History  . Marital status: Married    Spouse name: Not on file  . Number of children: Not on file  . Years of education: Not on file  . Highest education level: Not on file  Occupational History  . Not on file  Social Needs  . Physicist, medical  strain: Not on file  . Food insecurity:    Worry: Not on file    Inability: Not on file  . Transportation needs:    Medical: Not on file    Non-medical: Not on file  Tobacco Use  . Smoking status: Never Smoker  . Smokeless tobacco: Never Used  Substance and Sexual Activity  . Alcohol use: Yes    Alcohol/week: 0.0 oz    Comment: Occasional  . Drug use: No  . Sexual activity: Yes    Partners: Male  Lifestyle  . Physical activity:    Days per week: Not on file    Minutes per session: Not on file  . Stress: Not on file  Relationships  . Social connections:    Talks on phone: Not on file    Gets together: Not on file    Attends religious service: Not on file    Active member of club or organization: Not on file    Attends meetings of clubs or organizations: Not on file      Relationship status: Not on file  Other Topics Concern  . Not on file  Social History Narrative   5-6 hours of sleep per night   Lives with her husband who is retired Engineer, 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   Orig from [place]   in [place] since 1988   [name]     Tobacco Counseling Counseling given: Yes   Clinical Intake: Activities of Daily Living In your present state of health, do you have any difficulty performing the following activities: 10/19/2017 04/08/2017  Hearing? N N  Vision? N N  Difficulty concentrating or making decisions? N N  Walking or climbing stairs? N N  Dressing or bathing? N N  Doing errands, shopping? N N  Preparing Food and eating ? N -  Using the Toilet? N -  In the past six months, have you accidently leaked urine? N -  Do you have problems with loss of bowel control? N -  Managing your Medications? N -  Managing your Finances? N -  Housekeeping or managing your Housekeeping? N -  Some recent data might be hidden     Immunizations and Health Maintenance Immunization History  Administered Date(s) Administered  . PPD Test 05/11/2013, 04/01/2014, 04/01/2015, 05/29/2015, 04/08/2017, 05/02/2017  . Pneumococcal Conjugate-13 04/02/2016  . Pneumococcal Polysaccharide-23 09/05/2012, 10/19/2017  . Tdap 02/20/2007  . Zoster 04/22/2011   Health Maintenance Due  Topic Date Due  . Hepatitis C Screening  10/17/1950  . TETANUS/TDAP  02/19/2017  . PNA vac Low Risk Adult (2 of 2 - PPSV23) 09/05/2017    Patient Care Team: Madelin Headings, MD as PCP - General (Internal Medicine) Renaldo Reel Georgeanna Harrison, MD as Referring Physician (Dermatology) Michele Mcalpine, MD as Consulting Physician (Pulmonary Disease)  Indicate any recent Medical Services you may have received from other  than [facility] providers in the past year (date may be approximate).     Assessment:   This is a routine wellness examination for Courtney Bray.  Hearing/Vision screen Hearing Screening Comments: No hearing issues  Vision Screening Comments: Goes this month to [facility]  Dietary issues and exercise activities discussed: Current Exercise Habits: Home exercise routine,  Type of exercise: walking, Time (Minutes): 30(going to work up to this), Frequency (Times/Week): 5, Weekly Exercise (Minutes/Week): 150, Intensity: Moderate  Goals    . Exercise 150 min/wk Moderate Activity     Will start walking more Plan will be start walking after spouse picks up your mother   Maybe try the silver sneakers- try and see       Depression Screen PHQ 2/9 Scores 10/19/2017 06/08/2017 04/08/2017 04/02/2016  PHQ - 2 Score 0 0 0 0    Fall Risk Fall Risk  10/19/2017 06/08/2017 04/08/2017 04/02/2016  Falls in the past year? No No No Yes  Number falls in past yr: - - - 1  Injury with Fall? - - - No      Cognitive Function: Ad8 score reviewed for issues:  Issues making decisions:  Less interest in hobbies / activities:  Repeats questions, stories (family complaining):  Trouble using ordinary gadgets (microwave, computer, phone):  Forgets the month or year:   Mismanaging finances:   Remembering appts:  Daily problems with thinking and/or memory: Ad8 score is=0  MMSE - Mini Mental State Exam 10/19/2017  Not completed: (No Data)        Screening Tests Health Maintenance  Topic Date Due  . Hepatitis C Screening  [birth year]  . TETANUS/TDAP  02/19/2017  . PNA vac Low Risk Adult (2 of 2 - PPSV23) 09/05/2017  . INFLUENZA VACCINE  01/19/2018  . MAMMOGRAM  04/08/2018  . COLONOSCOPY  02/15/2019  . DEXA SCAN  Completed         Plan:      PCP Notes   Health Maintenance Will defer repeat dexa at this time, does not wish to take medicine; referred to the osteoporosis foundation  for education and to discuss with Dr. Fabian Sharp this year   Does not want to repeat the colonoscopy; agrees to cologuard when due  Needs Td - may take at pharmacy  Educated regarding the shingrix   rec'd psv 23 today  Abnormal Screens  none  Referrals  none  Patient concerns; As noted  Agrees to try to work in more exercise   Nurse Concerns; As noted   Next PCP apt 04/11/2018      I have personally reviewed and noted the following in the patient's chart:   . Medical and social history . Use of alcohol, tobacco or illicit drugs  . Current medications and supplements . Functional ability and status . Nutritional status . Physical activity . Advanced directives . List of other physicians . Hospitalizations, surgeries, and ER visits in previous 12 months . Vitals . Screenings to include cognitive, depression, and falls . Referrals and appointments  In addition, I have reviewed and discussed with patient certain preventive protocols, quality metrics, and best practice recommendations. A written personalized care plan for preventive services as well as general preventive health recommendations were provided to patient.     Griselda Tosh, RN   10/19/2017    Above noted reviewed and agree. Berniece Andreas, MD

## 2017-10-19 ENCOUNTER — Ambulatory Visit (INDEPENDENT_AMBULATORY_CARE_PROVIDER_SITE_OTHER): Payer: PPO

## 2017-10-19 VITALS — BP 132/80 | HR 83 | Ht 61.5 in | Wt 112.4 lb

## 2017-10-19 DIAGNOSIS — Z23 Encounter for immunization: Secondary | ICD-10-CM

## 2017-10-19 DIAGNOSIS — Z0000 Encounter for general adult medical examination without abnormal findings: Secondary | ICD-10-CM | POA: Diagnosis not present

## 2017-10-19 DIAGNOSIS — Z1159 Encounter for screening for other viral diseases: Secondary | ICD-10-CM | POA: Diagnosis not present

## 2017-10-19 NOTE — Patient Instructions (Addendum)
[name] , Thank you for taking time to come for your Medicare Wellness Visit. I appreciate your ongoing commitment to your health goals. Please review the following plan we discussed and let me know if I can assist you in the future.   Medicare Part B will cover the CologuardTM test once every three years for beneficiaries who meet all of the following criteria:  Age 67 to 67 years,  Asymptomatic (no signs or symptoms of colorectal disease including but not limited to lower gastrointestinal pain, blood in stool, positive guaiac fecal occult blood test or fecal immunochemical test), and  At average risk of developing colorectal cancer (no personal history of adenomatous polyps, colorectal cancer, or inflammatory bowel disease, including Crohn's Disease and ulcerative colitis; no family history of colorectal cancers or adenomatous polyps, familial adenomatous polyposis, or hereditary nonpolyposis colorectal cancer).  Medicare now request all "baby boomers" test for possible exposure to Hepatitis C. Many may have been exposed due to dental work, tatoo's, vaccinations when young. The Hepatitis C virus is dormant for many years and then sometimes will cause liver cancer. If you gave blood in the past 15 years, you were most likely checked for Hep C. If you rec'd blood; you may want to consider testing or if you are high risk for any other reason.    A Tetanus is recommended every 10 years. Medicare covers a tetanus if you have a cut or wound; otherwise, there may be a charge. YOU WILL NEED  A REGULAR TD   Shingrix is a vaccine for the prevention of Shingles in Adults 67 and older.  If you are on Medicare, the shingrix is covered under your Part D plan, so you will take both of the vaccines in the series at your pharmacy. Please check with your benefits regarding applicable copays or out of pocket expenses.  The Shingrix is given in 2 vaccines approx 8 weeks apart. You must receive the 2nd dose  prior to 6 months from receipt of the first. Please have the pharmacist print out you Immunization  dates for our office records   Recommendations for Dexa Scan Female over the age of 38 Man age 20 or older If you broke a bone past the age of 30 Women menopausal age with risk factors (thin frame; smoker; hx of fx ) Post menopausal women under the age of 1 with risk factors A man age 53 to 61 with risk factors Other: Spine xray that is showing break of bone loss Back pain with possible break Height loss of 1/2 inch or more within one year Total loss in height of 1.5 inches from your original height  Calcium '1200mg'$  with Vit D 800u per day; more as directed by physician Strength building exercises discussed; can include walking; housework; small weights or stretch bands; silver sneakers if access to the Y  Please visit the osteoporosis foundation.org for up to date recommendations Discuss repeat dexa with Dr. Regis Bill    These are the goals we discussed: Goals    . Exercise 150 min/wk Moderate Activity     Will start walking more Plan will be start walking after spouse picks up your mother   Maybe try the silver sneakers- try and see        This is a list of the screening recommended for you and due dates:  Health Maintenance  Topic Date Due  .  Hepatitis C: One time screening is recommended by Center for Disease Control  (CDC) for  adults born from [birth year] through 1965.   [birth year]  . Tetanus Vaccine  02/19/2017  . Pneumonia vaccines (2 of 2 - PPSV23) 09/05/2017  . Flu Shot  01/19/2018  . Mammogram  04/08/2018  . Colon Cancer Screening  02/15/2019  . DEXA scan (bone density measurement)  Completed   Prevention of falls: Remove rugs or any tripping hazards in the home Use Non slip mats in bathtubs and showers Placing grab bars next to the toilet and or shower Placing handrails on both sides of the stair way Adding extra lighting in the home.   Personal safety issues  reviewed:  1. Consider starting a community watch program per [facility] 2.  Changes batteries is smoke detector and/or carbon monoxide detector  3.  If you have firearms; keep them in a safe place 4.  Wear protection when in the sun; Always wear sunscreen or a hat; It is good to have your doctor check your skin annually or review any new areas of concern 5. Driving safety; Keep in the right lane; stay 3 car lengths behind the car in front of you on the highway; look 3 times prior to pulling out; carry your cell phone everywhere you go!     Bone Densitometry Bone densitometry is an imaging test that uses a special X-ray to measure the amount of calcium and other minerals in your bones (bone density). This test is also known as a bone mineral density test or dual-energy X-ray absorptiometry (DXA). The test can measure bone density at your hip and your spine. It is similar to having a regular X-ray. You may have this test to:  Diagnose a condition that causes weak or thin bones (osteoporosis).  Predict your risk of a broken bone (fracture).  Determine how well osteoporosis treatment is working.  Tell a health care provider about:  Any allergies you have.  All medicines you are taking, including vitamins, herbs, eye drops, creams, and over-the-counter medicines.  Any problems you or family members have had with anesthetic medicines.  Any blood disorders you have.  Any surgeries you have had.  Any medical conditions you have.  Possibility of pregnancy.  Any other medical test you had within the previous 14 days that used contrast material. What are the risks? Generally, this is a safe procedure. However, problems can occur and may include the following:  This test exposes you to a very small amount of radiation.  The risks of radiation exposure may be greater to unborn children.  What happens before the procedure?  Do not take any calcium supplements for 24 hours  before having the test. You can otherwise eat and drink what you usually do.  Take off all metal jewelry, eyeglasses, dental appliances, and any other metal objects. What happens during the procedure?  You may lie on an exam table. There will be an X-ray generator below you and an imaging device above you.  Other devices, such as boxes or braces, may be used to position your body properly for the scan.  You will need to lie still while the machine slowly scans your body.  The images will show up on a computer monitor. What happens after the procedure? You may need more testing at a later time. This information is not intended to replace advice given to you by your health care provider. Make sure you discuss any questions you have with your health care provider. Document Released: 06/29/2004 Document Revised: 11/13/2015 Document Reviewed: 11/15/2013 Elsevier Interactive  Patient Education  2018 [place] in the [facility] can cause injuries. They can happen to people of all ages. There are many things you can do to make your home safe and to help prevent falls. What can I do on the outside of my home?  Regularly fix the edges of walkways and driveways and fix any cracks.  Remove anything that might make you trip as you walk through a door, such as a raised step or threshold.  Trim any bushes or trees on the path to your home.  Use bright outdoor lighting.  Clear any walking paths of anything that might make someone trip, such as rocks or tools.  Regularly check to see if handrails are loose or broken. Make sure that both sides of any steps have handrails.  Any raised decks and porches should have guardrails on the edges.  Have any leaves, snow, or ice cleared regularly.  Use sand or salt on walking paths during winter.  Clean up any spills in your garage right away. This includes oil or grease spills. What can I do in the bathroom?  Use night  lights.  Install grab bars by the toilet and in the tub and shower. Do not use towel bars as grab bars.  Use non-skid mats or decals in the tub or shower.  If you need to sit down in the shower, use a plastic, non-slip stool.  Keep the floor dry. Clean up any water that spills on the floor as soon as it happens.  Remove soap buildup in the tub or shower regularly.  Attach bath mats securely with double-sided non-slip rug tape.  Do not have throw rugs and other things on the floor that can make you trip. What can I do in the bedroom?  Use night lights.  Make sure that you have a light by your bed that is easy to reach.  Do not use any sheets or blankets that are too big for your bed. They should not hang down onto the floor.  Have a firm chair that has side arms. You can use this for support while you get dressed.  Do not have throw rugs and other things on the floor that can make you trip. What can I do in the kitchen?  Clean up any spills right away.  Avoid walking on wet floors.  Keep items that you use a lot in easy-to-reach places.  If you need to reach something above you, use a strong step stool that has a grab bar.  Keep electrical cords out of the way.  Do not use floor polish or wax that makes floors slippery. If you must use wax, use non-skid floor wax.  Do not have throw rugs and other things on the floor that can make you trip. What can I do with my stairs?  Do not leave any items on the stairs.  Make sure that there are handrails on both sides of the stairs and use them. Fix handrails that are broken or loose. Make sure that handrails are as long as the stairways.  Check any carpeting to make sure that it is firmly attached to the stairs. Fix any carpet that is loose or worn.  Avoid having throw rugs at the top or bottom of the stairs. If you do have throw rugs, attach them to the floor with carpet tape.  Make sure that you have a light switch at the  top of the stairs  and the bottom of the stairs. If you do not have them, ask someone to add them for you. What else can I do to help prevent falls?  Wear shoes that: ? Do not have high heels. ? Have rubber bottoms. ? Are comfortable and fit you well. ? Are closed at the toe. Do not wear sandals.  If you use a stepladder: ? Make sure that it is fully opened. Do not climb a closed stepladder. ? Make sure that both sides of the stepladder are locked into place. ? Ask someone to hold it for you, if possible.  Clearly mark and make sure that you can see: ? Any grab bars or handrails. ? First and last steps. ? Where the edge of each step is.  Use tools that help you move around (mobility aids) if they are needed. These include: ? Canes. ? Walkers. ? Scooters. ? Crutches.  Turn on the lights when you go into a dark area. Replace any light bulbs as soon as they burn out.  Set up your furniture so you have a clear path. Avoid moving your furniture around.  If any of your floors are uneven, fix them.  If there are any pets around you, be aware of where they are.  Review your medicines with your doctor. Some medicines can make you feel dizzy. This can increase your chance of falling. Ask your doctor what other things that you can do to help prevent falls. This information is not intended to replace advice given to you by your health care provider. Make sure you discuss any questions you have with your health care provider. Document Released: 04/03/2009 Document Revised: 11/13/2015 Document Reviewed: 07/12/2014 Elsevier Interactive Patient Education  2018 [place] Maintenance, Female Adopting a healthy lifestyle and getting preventive care can go a long way to promote health and wellness. Talk with your health care provider about what schedule of regular examinations is right for you. This is a good chance for you to check in with your provider about disease prevention and  staying healthy. In between checkups, there are plenty of things you can do on your own. Experts have done a lot of research about which lifestyle changes and preventive measures are most likely to keep you healthy. Ask your health care provider for more information. Weight and diet Eat a healthy diet  Be sure to include plenty of vegetables, fruits, low-fat dairy products, and lean protein.  Do not eat a lot of foods high in solid fats, added sugars, or salt.  Get regular exercise. This is one of the most important things you can do for your health. ? Most adults should exercise for at least 150 minutes each week. The exercise should increase your heart rate and make you sweat (moderate-intensity exercise). ? Most adults should also do strengthening exercises at least twice a week. This is in addition to the moderate-intensity exercise.  Maintain a healthy weight  Body mass index (BMI) is a measurement that can be used to identify possible weight problems. It estimates body fat based on height and weight. Your health care provider can help determine your BMI and help you achieve or maintain a healthy weight.  For females 43 years of age and older: ? A BMI below 18.5 is considered underweight. ? A BMI of 18.5 to 24.9 is normal. ? A BMI of 25 to 29.9 is considered overweight. ? A BMI of 30 and above is considered obese.  Watch levels of  cholesterol and blood lipids  You should start having your blood tested for lipids and cholesterol at 67 years of age, then have this test every 5 years.  You may need to have your cholesterol levels checked more often if: ? Your lipid or cholesterol levels are high. ? You are older than 67 years of age. ? You are at high risk for heart disease.  Cancer screening Lung Cancer  Lung cancer screening is recommended for adults 31-35 years old who are at high risk for lung cancer because of a history of smoking.  A yearly low-dose CT scan of the lungs  is recommended for people who: ? Currently smoke. ? Have quit within the past 15 years. ? Have at least a 30-pack-year history of smoking. A pack year is smoking an average of one pack of cigarettes a day for 1 year.  Yearly screening should continue until it has been 15 years since you quit.  Yearly screening should stop if you develop a health problem that would prevent you from having lung cancer treatment.  Breast Cancer  Practice breast self-awareness. This means understanding how your breasts normally appear and feel.  It also means doing regular breast self-exams. Let your health care provider know about any changes, no matter how small.  If you are in your 20s or 30s, you should have a clinical breast exam (CBE) by a health care provider every 1-3 years as part of a regular health exam.  If you are 45 or older, have a CBE every year. Also consider having a breast X-ray (mammogram) every year.  If you have a family history of breast cancer, talk to your health care provider about genetic screening.  If you are at high risk for breast cancer, talk to your health care provider about having an MRI and a mammogram every year.  Breast cancer gene (BRCA) assessment is recommended for women who have family members with BRCA-related cancers. BRCA-related cancers include: ? Breast. ? Ovarian. ? Tubal. ? Peritoneal cancers.  Results of the assessment will determine the need for genetic counseling and BRCA1 and BRCA2 testing.  Cervical Cancer Your health care provider may recommend that you be screened regularly for cancer of the pelvic organs (ovaries, uterus, and vagina). This screening involves a pelvic examination, including checking for microscopic changes to the surface of your cervix (Pap test). You may be encouraged to have this screening done every 3 years, beginning at age 72.  For women ages 43-65, health care providers may recommend pelvic exams and Pap testing every 3  years, or they may recommend the Pap and pelvic exam, combined with testing for human papilloma virus (HPV), every 5 years. Some types of HPV increase your risk of cervical cancer. Testing for HPV may also be done on women of any age with unclear Pap test results.  Other health care providers may not recommend any screening for nonpregnant women who are considered low risk for pelvic cancer and who do not have symptoms. Ask your health care provider if a screening pelvic exam is right for you.  If you have had past treatment for cervical cancer or a condition that could lead to cancer, you need Pap tests and screening for cancer for at least 20 years after your treatment. If Pap tests have been discontinued, your risk factors (such as having a new sexual partner) need to be reassessed to determine if screening should resume. Some women have medical problems that increase the chance of  getting cervical cancer. In these cases, your health care provider may recommend more frequent screening and Pap tests.  Colorectal Cancer  This type of cancer can be detected and often prevented.  Routine colorectal cancer screening usually begins at 67 years of age and continues through 67 years of age.  Your health care provider may recommend screening at an earlier age if you have risk factors for colon cancer.  Your health care provider may also recommend using home test kits to check for hidden blood in the stool.  A small camera at the end of a tube can be used to examine your colon directly (sigmoidoscopy or colonoscopy). This is done to check for the earliest forms of colorectal cancer.  Routine screening usually begins at age 66.  Direct examination of the colon should be repeated every 5-10 years through 67 years of age. However, you may need to be screened more often if early forms of precancerous polyps or small growths are found.  Skin Cancer  Check your skin from head to toe regularly.  Tell  your health care provider about any new moles or changes in moles, especially if there is a change in a mole's shape or color.  Also tell your health care provider if you have a mole that is larger than the size of a pencil eraser.  Always use sunscreen. Apply sunscreen liberally and repeatedly throughout the day.  Protect yourself by wearing long sleeves, pants, a wide-brimmed hat, and sunglasses whenever you are outside.  Heart disease, diabetes, and high blood pressure  High blood pressure causes heart disease and increases the risk of stroke. High blood pressure is more likely to develop in: ? People who have blood pressure in the high end of the normal range (130-139/85-89 mm Hg). ? People who are overweight or obese. ? People who are African American.  If you are 39-12 years of age, have your blood pressure checked every 3-5 years. If you are 3 years of age or older, have your blood pressure checked every year. You should have your blood pressure measured twice-once when you are at a hospital or clinic, and once when you are not at a hospital or clinic. Record the average of the two measurements. To check your blood pressure when you are not at a hospital or clinic, you can use: ? An automated blood pressure machine at a pharmacy. ? A home blood pressure monitor.  If you are between 87 years and 65 years old, ask your health care provider if you should take aspirin to prevent strokes.  Have regular diabetes screenings. This involves taking a blood sample to check your fasting blood sugar level. ? If you are at a normal weight and have a low risk for diabetes, have this test once every three years after 67 years of age. ? If you are overweight and have a high risk for diabetes, consider being tested at a younger age or more often. Preventing infection Hepatitis B  If you have a higher risk for hepatitis B, you should be screened for this virus. You are considered at high risk for  hepatitis B if: ? You were born in a country where hepatitis B is common. Ask your health care provider which countries are considered high risk. ? Your parents were born in a high-risk country, and you have not been immunized against hepatitis B (hepatitis B vaccine). ? You have HIV or AIDS. ? You use needles to inject street drugs. ? You  live with someone who has hepatitis B. ? You have had sex with someone who has hepatitis B. ? You get hemodialysis treatment. ? You take certain medicines for conditions, including cancer, organ transplantation, and autoimmune conditions.  Hepatitis C  Blood testing is recommended for: ? Everyone born from [birth year] through 1965. ? Anyone with known risk factors for hepatitis C.  Sexually transmitted infections (STIs)  You should be screened for sexually transmitted infections (STIs) including gonorrhea and chlamydia if: ? You are sexually active and are younger than 67 years of age. ? You are older than 67 years of age and your health care provider tells you that you are at risk for this type of infection. ? Your sexual activity has changed since you were last screened and you are at an increased risk for chlamydia or gonorrhea. Ask your health care provider if you are at risk.  If you do not have HIV, but are at risk, it may be recommended that you take a prescription medicine daily to prevent HIV infection. This is called pre-exposure prophylaxis (PrEP). You are considered at risk if: ? You are sexually active and do not regularly use condoms or know the HIV status of your partner(s). ? You take drugs by injection. ? You are sexually active with a partner who has HIV.  Talk with your health care provider about whether you are at high risk of being infected with HIV. If you choose to begin PrEP, you should first be tested for HIV. You should then be tested every 3 months for as long as you are taking PrEP. Pregnancy  If you are premenopausal and you may  become pregnant, ask your health care provider about preconception counseling.  If you may become pregnant, take 400 to 800 micrograms (mcg) of folic acid every day.  If you want to prevent pregnancy, talk to your health care provider about birth control (contraception). Osteoporosis and menopause  Osteoporosis is a disease in which the bones lose minerals and strength with aging. This can result in serious bone fractures. Your risk for osteoporosis can be identified using a bone density scan.  If you are 76 years of age or older, or if you are at risk for osteoporosis and fractures, ask your health care provider if you should be screened.  Ask your health care provider whether you should take a calcium or vitamin D supplement to lower your risk for osteoporosis.  Menopause may have certain physical symptoms and risks.  Hormone replacement therapy may reduce some of these symptoms and risks. Talk to your health care provider about whether hormone replacement therapy is right for you. Follow these instructions at home:  Schedule regular health, dental, and eye exams.  Stay current with your immunizations.  Do not use any tobacco products including cigarettes, chewing tobacco, or electronic cigarettes.  If you are pregnant, do not drink alcohol.  If you are breastfeeding, limit how much and how often you drink alcohol.  Limit alcohol intake to no more than 1 drink per day for nonpregnant women. One drink equals 12 ounces of beer, 5 ounces of wine, or 1 ounces of hard liquor.  Do not use street drugs.  Do not share needles.  Ask your health care provider for help if you need support or information about quitting drugs.  Tell your health care provider if you often feel depressed.  Tell your health care provider if you have ever been abused or do not feel safe at home.  This information is not intended to replace advice given to you by your health care provider. Make sure you  discuss any questions you have with your health care provider. Document Released: 12/21/2010 Document Revised: 11/13/2015 Document Reviewed: 03/11/2015 Elsevier Interactive Patient Education  [employer].

## 2017-11-02 DIAGNOSIS — H2513 Age-related nuclear cataract, bilateral: Secondary | ICD-10-CM | POA: Diagnosis not present

## 2017-12-05 IMAGING — DX DG CHEST 2V
2 series · 2 of 2 positions shown · non-contrast
Comparison: 08/31/2011

CLINICAL DATA: Routine annual checkup.  Sarcoidosis.

EXAM:
CHEST  2 VIEW

[chest pa]
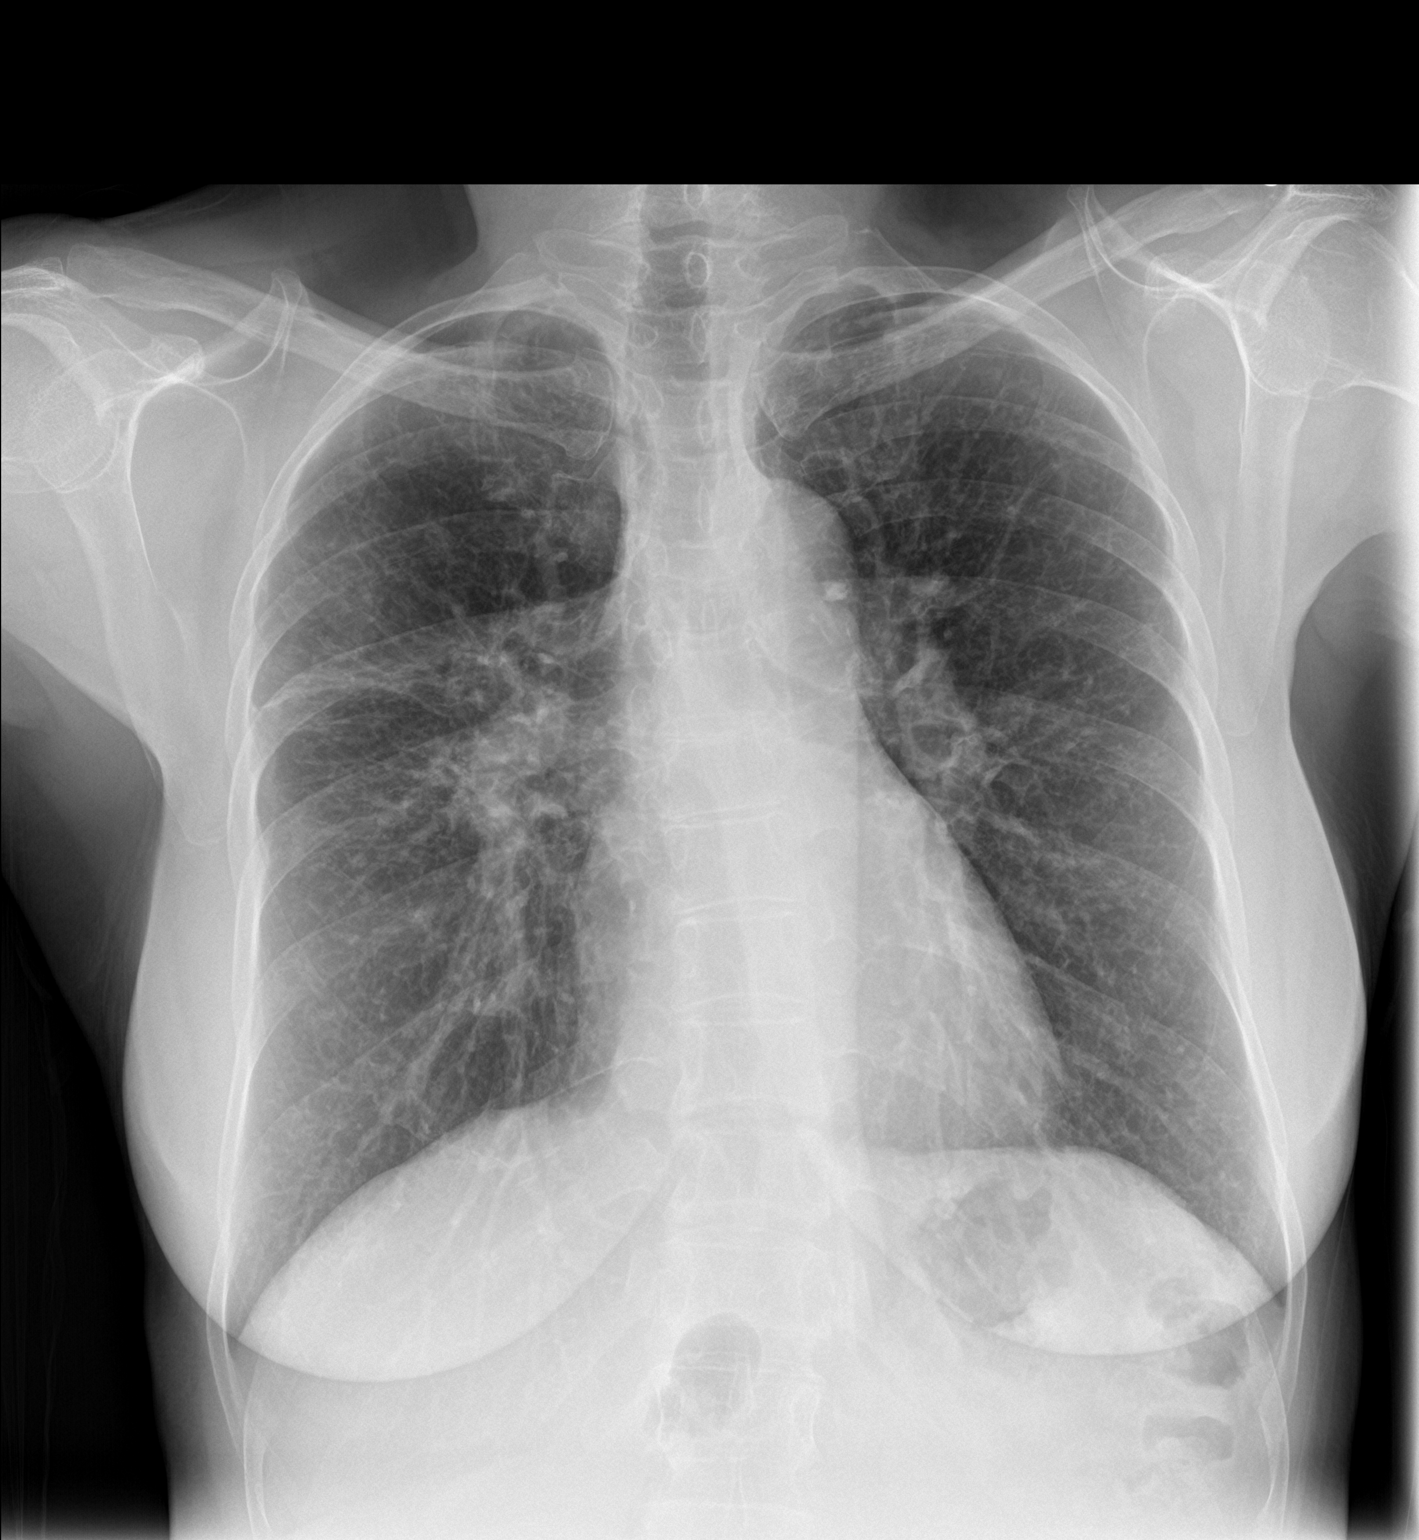

[chest lat]
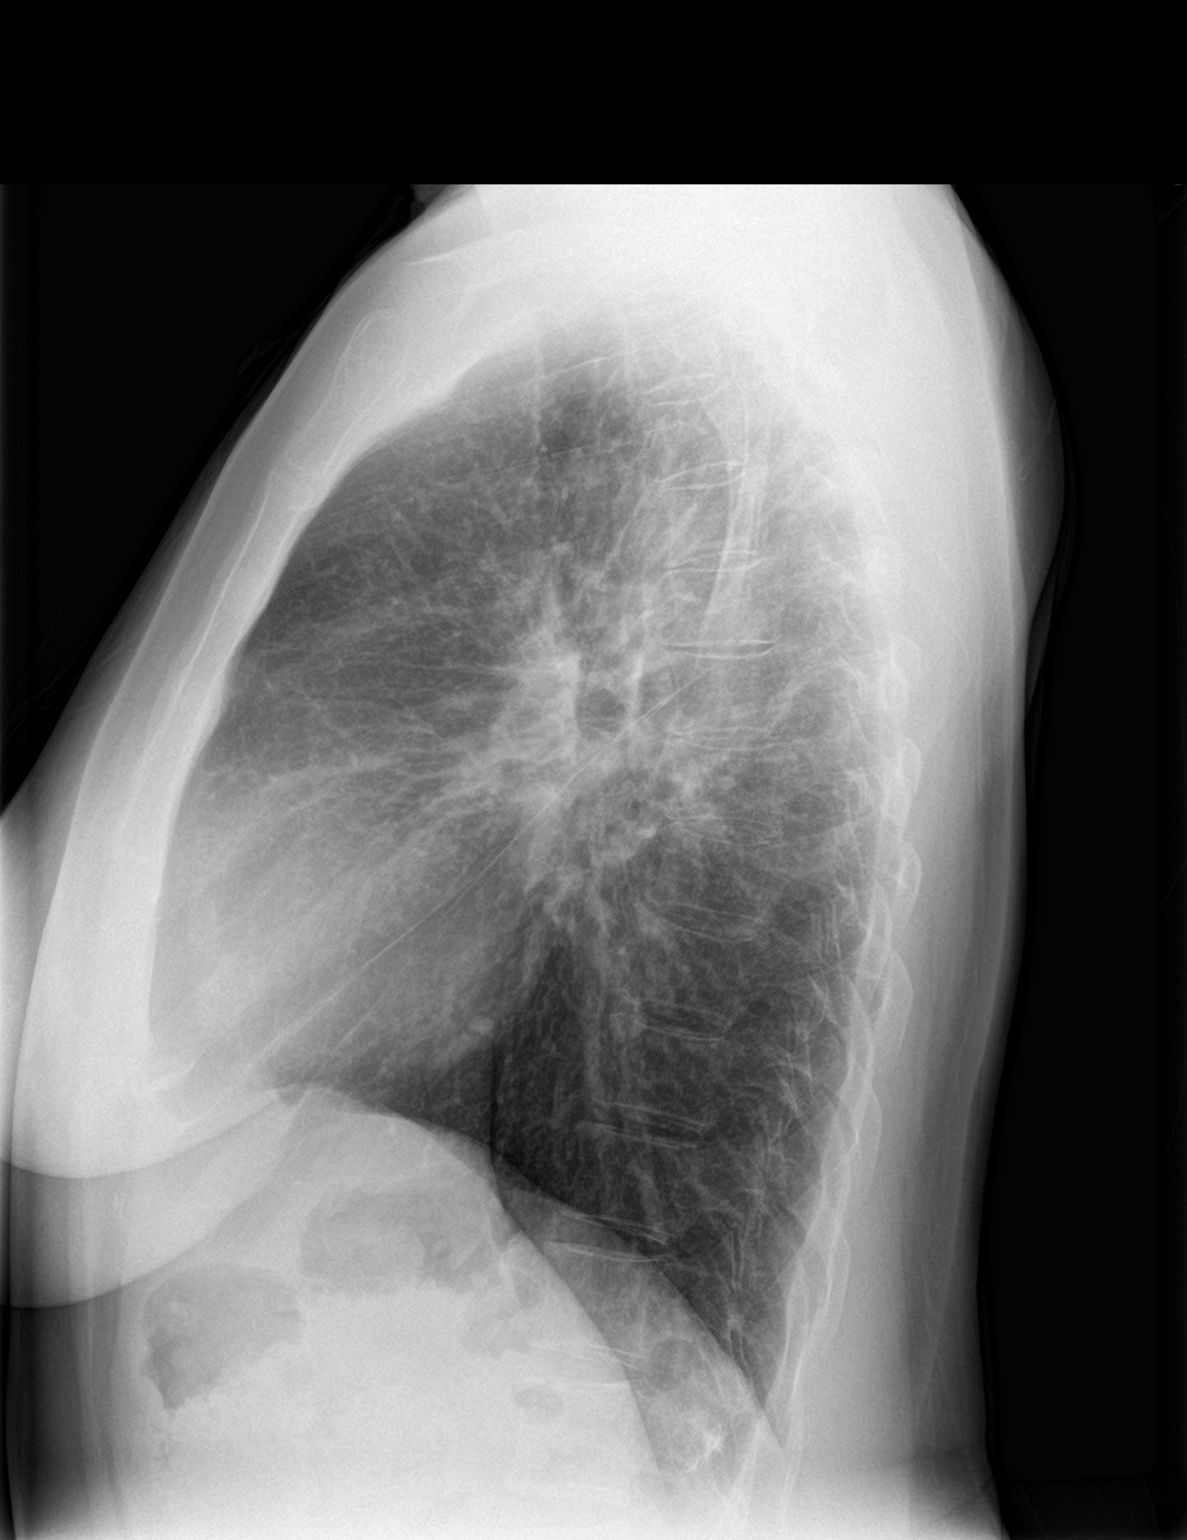

[2 of 2 positions shown; findings below may reference images not displayed]

FINDINGS: Normal heart size. Stable coarse interstitial opacities with right
greater than left hilar enlargement and right perihilar linear
opacity and volume loss. No acute infiltrate or edema. No effusion
or pneumothorax. No acute osseous findings.
IMPRESSION: Stable from 1595. Hilar adenopathy and right perihilar interstitial
disease correlating with history of sarcoidosis.

## 2018-04-11 ENCOUNTER — Encounter: Payer: PPO | Admitting: Internal Medicine

## 2018-04-25 ENCOUNTER — Encounter: Payer: PPO | Admitting: Internal Medicine

## 2018-05-09 ENCOUNTER — Encounter: Payer: PPO | Admitting: Internal Medicine

## 2018-05-11 DIAGNOSIS — D860 Sarcoidosis of lung: Secondary | ICD-10-CM | POA: Diagnosis not present

## 2018-05-11 DIAGNOSIS — D863 Sarcoidosis of skin: Secondary | ICD-10-CM | POA: Diagnosis not present

## 2018-05-11 DIAGNOSIS — L301 Dyshidrosis [pompholyx]: Secondary | ICD-10-CM | POA: Diagnosis not present

## 2018-05-11 DIAGNOSIS — Z5181 Encounter for therapeutic drug level monitoring: Secondary | ICD-10-CM | POA: Diagnosis not present

## 2018-05-11 DIAGNOSIS — D869 Sarcoidosis, unspecified: Secondary | ICD-10-CM | POA: Diagnosis not present

## 2018-05-11 DIAGNOSIS — Z79899 Other long term (current) drug therapy: Secondary | ICD-10-CM | POA: Diagnosis not present

## 2018-05-29 NOTE — Progress Notes (Signed)
Chief Complaint  Patient presents with  . Annual Exam    No new concerns    HPI: Courtney Bray 67 y.o. comes in today for Preventive Medicare exam/ wellness visit .Since last visit. Doing pretty well sees derm on humira .  To see  Dr Courtney Bray  Last visit soon .   for sarcoid  resp doing well  Has had tb tests   Blood  Per derm     Health Maintenance  Topic Date Due  . INFLUENZA VACCINE  01/19/2018  . MAMMOGRAM  04/08/2018  . TETANUS/TDAP  10/20/2018 (Originally 02/19/2017)  . Hepatitis C Screening  04/21/2019 (Originally [birth year])  . COLONOSCOPY  02/15/2019  . DEXA SCAN  Completed  . PNA vac Low Risk Adult  Completed   Health Maintenance Review LIFESTYLE:  Exercise:   Lacking this year.  ca retaking   osteostrong Tobacco/ETS: no Alcohol:   ocass Sugar beverages:no Sleep: 6 Drug use: no HH:  2 Administrator    Hearing: ok  Vision:  No limitations at present . Last eye check UTD  Safety:  Has smoke detector and wears seat belts. No excess sun exposure. Sees dentist regularly.  Falls:   Advance directive :  Reviewed  Has one.  Memory: Felt to be good  , no concern from her or her family.  Depression: No anhedonia unusual crying or depressive symptoms  Nutrition: Eats well balanced diet; adequate calcium and vitamin D. No swallowing chewing problems.  Injury: no major injuries in the last six months.  Other healthcare providers:  Reviewed today   Preventive parameters: up-to-date  Reviewed   ADLS:   There are no problems or need for assistance  driving, feeding, obtaining food, dressing, toileting and bathing, managing money using phone. She is independent.   ROS:  GEN/ HEENT: No fever, significant weight changes sweats headaches vision problems hearing changes, CV/ PULM; No chest pain shortness of breath cough, syncope,edema  change in exercise tolerance. GI /GU: No adominal pain, vomiting, change in bowel habits. No blood in the stool. No  significant GU symptoms. SKIN/HEME: ,no acute skin rashes suspicious lesions or bleeding. No lymphadenopathy, nodules, masses.  NEURO/ PSYCH:  No neurologic signs such as weakness numbness. No depression anxiety. IMM/ Allergy: No unusual infections.  Allergy .   REST of 12 system review negative except as per HPI   Past Medical History:  Diagnosis Date  . Chicken pox   . Lymphocytosis   . Osteoporosis   . 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  PFT's 2010:  No obstruction, TLC 3.93 (87%), DLCO 89%. PFT's 2014:  No obstruction, TLC 3.07, DLCO 91%      Family History  Problem Relation Age of Onset  . Heart disease Father   . Stroke Father   . Dementia Father   . Dementia Mother   . High blood pressure Mother   . High blood pressure Sister        with hypertesnive renal disease    Social History   Socioeconomic History  . Marital status: Married    Spouse name: Not on file  . Number of children: Not on file  . Years of education: Not on file  . Highest education level: Not on file  Occupational History  . Not on file  Social Needs  . Financial resource strain: Not on  file  . Food insecurity:    Worry: Not on file    Inability: Not on file  . Transportation needs:    Medical: Not on file    Non-medical: Not on file  Tobacco Use  . Smoking status: Never Smoker  . Smokeless tobacco: Never Used  Substance and Sexual Activity  . Alcohol use: Yes    Alcohol/week: 0.0 standard drinks    Comment: Occasional  . Drug use: No  . Sexual activity: Yes    Partners: Male  Lifestyle  . Physical activity:    Days per week: Not on file    Minutes per session: Not on file  . Stress: Not on file  Relationships  . Social connections:    Talks on phone: Not on file    Gets together: Not on file    Attends religious service: Not on file    Active member of club or organization:  Not on file    Attends meetings of clubs or organizations: Not on file    Relationship status: Not on file  Other Topics Concern  . 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   Orig from [place]   in [place] since 1988   [name]     Outpatient Encounter Medications as of 05/30/2018  Medication Sig  . clobetasol cream (TEMOVATE) 0.05 % Apply topically as needed.  Marland Kitchen HUMIRA PEN 40 MG/0.8ML PNKT Inject every other week for sarcoidosis   No facility-administered encounter medications on file as of 05/30/2018.     EXAM:  BP 122/72 (BP Location: Right Arm, Patient Position: Sitting, Cuff Size: Normal)   Pulse 93   Temp 98.3 F (36.8 C) (Oral)   Ht 5' 0.75" (1.543 m)   Wt 115 lb 9.6 oz (52.4 kg)   BMI 22.02 kg/m   Body mass index is 22.02 kg/m.  Physical Exam: Vital signs reviewed VCB:SWHQ is a well-developed well-nourished alert cooperative   who appears stated age in no acute distress.  HEENT: normocephalic atraumatic , Eyes: PERRL EOM's full, conjunctiva clear, Nares: paten,t no deformity discharge or tenderness., Ears: no deformity EAC's clear TMs with normal landmarks. Mouth: clear OP, no lesions, edema.  Moist mucous membranes. Dentition in adequate repair. NECK: supple without masses, thyromegaly or bruits. CHEST/PULM:  Clear to auscultation and percussion breath sounds equal no wheeze , rales or rhonchi. No chest wall deformities or tenderness.Breast: normal by inspection .nipple inverted no masses  No dimpling, discharge, masses, tenderness or discharge . CV: PMI is nondisplaced, S1 S2 no gallops, murmurs, rubs. Peripheral pulses are full without delay.No JVD .  ABDOMEN: Bowel sounds normal  nontender  No guard or rebound, no hepato splenomegal no CVA tenderness.   Extremtities:  No clubbing cyanosis or edema, no acute joint swelling or redness no focal atrophy NEURO:  Oriented x3, cranial nerves 3-12 appear to be intact, no obvious focal weakness,gait within normal limits no abnormal reflexes or asymmetrical SKIN: No acute rashes normal turgor, color, no bruising or petechiae. Vitiligo ext gu   Lateral foot patches  ( to use steroisas per derm)  PSYCH:  Oriented, good eye contact, no obvious depression anxiety, cognition and judgment appear normal. LN: no cervical axillary inguinal adenopathy No noted deficits in memory, attention, and speech. Pelvic: NL ext GU, labia clear without lesions or rash . Vagina no lesions .Cervix: clear  UTERUS: Neg CMT Adnexa:  clear no masses  . PAP done .with hr hpv    Lab Results  Component Value Date   WBC 4.1 05/30/2018   HGB 13.7 05/30/2018   HCT 40.7 05/30/2018   PLT 256.0 05/30/2018   GLUCOSE 77 05/30/2018   CHOL 202 (H) 05/30/2018   TRIG 69.0 05/30/2018   HDL 65.30 05/30/2018   LDLCALC 123 (H) 05/30/2018   ALT 30 05/30/2018   AST 23 05/30/2018   NA 139 05/30/2018   K 4.1 05/30/2018   CL 105 05/30/2018   CREATININE 0.63 05/30/2018   BUN 19 05/30/2018   CO2 30 05/30/2018   TSH 1.53 03/26/2016    ASSESSMENT AND PLAN:  Discussed the following assessment and plan:  Visit for preventive health examination - Plan: PAP [[place]]  Medication management - Plan: Basic metabolic panel, CBC with Differential/Platelet, Hepatic function panel, Lipid panel, Lipid panel, Hepatic function panel, CBC with Differential/Platelet, Basic metabolic panel  Sarcoidosis - Plan: Basic metabolic panel, CBC with Differential/Platelet, Hepatic function panel, Lipid panel, Lipid panel, Hepatic function panel, CBC with Differential/Platelet, Basic metabolic panel  Lymphocytosis - Plan: Basic metabolic panel, CBC with Differential/Platelet, Hepatic  function panel, Lipid panel, Lipid panel, Hepatic function panel, CBC with Differential/Platelet, Basic metabolic panel  Elevated LDL cholesterol level - Plan: Basic metabolic panel, CBC with Differential/Platelet, Hepatic function panel, Lipid panel, Lipid panel, Hepatic function panel, CBC with Differential/Platelet, Basic metabolic panel  High risk medication use - Plan: Basic metabolic panel, CBC with Differential/Platelet, Hepatic function panel, Lipid panel, Lipid panel, Hepatic function panel, CBC with Differential/Platelet, Basic metabolic panel  Encounter for gynecological examination without abnormal finding - Plan: PAP [[place]]  Need for influenza vaccination - Plan: Flu vaccine HIGH DOSE PF (Fluzone High dose) Counseled. And   Expectant management.  Lab today fasting  If pap pk no more pap needed  Unless problem  Patient Care Team: Panosh, Standley Brooking, MD as PCP - General (Internal Medicine) Ronalee Red Oswaldo Conroy, MD as Referring Physician (Dermatology) Noralee Space, MD as Consulting Physician (Pulmonary Disease)  Patient Instructions  Get mammogram .  Flu vaccine today .  shingrix vaccine at your pharmacy when avaiable.   Will order cologuard. Flu vaccine  today .  Will notify you  of labs when available.   Glad  You are doing well.   Preventive Care 65 Years and Older, Female Preventive care refers to lifestyle choices and visits with your health care provider that can promote health and wellness. What does preventive care include?  A yearly physical exam. This is also called an annual well check.  Dental exams once or twice a year.  Routine eye exams. Ask your health care provider how often you should have your eyes checked.  Personal lifestyle choices, including: ? Daily care of your teeth and gums. ? Regular physical activity. ? Eating a healthy diet. ? Avoiding tobacco and drug use. ? Limiting alcohol use. ? Practicing safe sex. ? Taking low-dose  aspirin every day. ? Taking vitamin and mineral supplements as recommended by your health care provider. What happens during an annual well check? The services and screenings done by your health care provider during your annual well check will depend  on your age, overall health, lifestyle risk factors, and family history of disease. Counseling Your health care provider may ask you questions about your:  Alcohol use.  Tobacco use.  Drug use.  Emotional well-being.  Home and relationship well-being.  Sexual activity.  Eating habits.  History of falls.  Memory and ability to understand (cognition).  Work and work Statistician.  Reproductive health.  Screening You may have the following tests or measurements:  Height, weight, and BMI.  Blood pressure.  Lipid and cholesterol levels. These may be checked every 5 years, or more frequently if you are over 4 years old.  Skin check.  Lung cancer screening. You may have this screening every year starting at age 85 if you have a 30-pack-year history of smoking and currently smoke or have quit within the past 15 years.  Fecal occult blood test (FOBT) of the stool. You may have this test every year starting at age 51.  Flexible sigmoidoscopy or colonoscopy. You may have a sigmoidoscopy every 5 years or a colonoscopy every 10 years starting at age 68.  Hepatitis C blood test.  Hepatitis B blood test.  Sexually transmitted disease (STD) testing.  Diabetes screening. This is done by checking your blood sugar (glucose) after you have not eaten for a while (fasting). You may have this done every 1-3 years.  Bone density scan. This is done to screen for osteoporosis. You may have this done starting at age 67.  Mammogram. This may be done every 1-2 years. Talk to your health care provider about how often you should have regular mammograms.  Talk with your health care provider about your test results, treatment options, and if  necessary, the need for more tests. Vaccines Your health care provider may recommend certain vaccines, such as:  Influenza vaccine. This is recommended every year.  Tetanus, diphtheria, and acellular pertussis (Tdap, Td) vaccine. You may need a Td booster every 10 years.  Varicella vaccine. You may need this if you have not been vaccinated.  Zoster vaccine. You may need this after age 74.  Measles, mumps, and rubella (MMR) vaccine. You may need at least one dose of MMR if you were born in 1957 or later. You may also need a second dose.  Pneumococcal 13-valent conjugate (PCV13) vaccine. One dose is recommended after age 4.  Pneumococcal polysaccharide (PPSV23) vaccine. One dose is recommended after age 41.  Meningococcal vaccine. You may need this if you have certain conditions.  Hepatitis A vaccine. You may need this if you have certain conditions or if you travel or work in places where you may be exposed to hepatitis A.  Hepatitis B vaccine. You may need this if you have certain conditions or if you travel or work in places where you may be exposed to hepatitis B.  Haemophilus influenzae type b (Hib) vaccine. You may need this if you have certain conditions.  Talk to your health care provider about which screenings and vaccines you need and how often you need them. This information is not intended to replace advice given to you by your health care provider. Make sure you discuss any questions you have with your health care provider. Document Released: 07/04/2015 Document Revised: 02/25/2016 Document Reviewed: 04/08/2015 Elsevier Interactive Patient Education  2018 [employer]. Panosh M.D.

## 2018-05-30 ENCOUNTER — Other Ambulatory Visit (HOSPITAL_COMMUNITY)
Admission: RE | Admit: 2018-05-30 | Discharge: 2018-05-30 | Disposition: A | Discharge status: Home / Self Care | Payer: PPO | Source: Ambulatory Visit | Attending: Internal Medicine | Admitting: Internal Medicine

## 2018-05-30 ENCOUNTER — Encounter: Payer: Self-pay | Admitting: Internal Medicine

## 2018-05-30 ENCOUNTER — Ambulatory Visit (INDEPENDENT_AMBULATORY_CARE_PROVIDER_SITE_OTHER): Payer: PPO | Admitting: Internal Medicine

## 2018-05-30 VITALS — BP 122/72 | HR 93 | Temp 98.3°F | Ht 60.75 in | Wt 115.6 lb

## 2018-05-30 DIAGNOSIS — Z79899 Other long term (current) drug therapy: Secondary | ICD-10-CM

## 2018-05-30 DIAGNOSIS — E7800 Pure hypercholesterolemia, unspecified: Secondary | ICD-10-CM

## 2018-05-30 DIAGNOSIS — D869 Sarcoidosis, unspecified: Secondary | ICD-10-CM | POA: Diagnosis not present

## 2018-05-30 DIAGNOSIS — Z23 Encounter for immunization: Secondary | ICD-10-CM | POA: Diagnosis not present

## 2018-05-30 DIAGNOSIS — Z01419 Encounter for gynecological examination (general) (routine) without abnormal findings: Secondary | ICD-10-CM | POA: Insufficient documentation

## 2018-05-30 DIAGNOSIS — D72820 Lymphocytosis (symptomatic): Secondary | ICD-10-CM | POA: Diagnosis not present

## 2018-05-30 DIAGNOSIS — Z0000 Encounter for general adult medical examination without abnormal findings: Secondary | ICD-10-CM

## 2018-05-30 LAB — LIPID PANEL
CHOL/HDL RATIO: 3
Cholesterol: 202 mg/dL — ABNORMAL HIGH (ref 0–200)
HDL: 65.3 mg/dL (ref >39.00)
LDL CALC: 123 mg/dL — AB (ref 0–99)
NONHDL: 136.84
Triglycerides: 69 mg/dL (ref 0.0–149.0)
VLDL: 13.8 mg/dL (ref 0.0–40.0)

## 2018-05-30 LAB — CBC WITH DIFFERENTIAL/PLATELET
BASOS PCT: 1.3 % (ref 0.0–3.0)
Basophils Absolute: 0.1 10*3/uL (ref 0.0–0.1)
EOS PCT: 5.1 % — AB (ref 0.0–5.0)
Eosinophils Absolute: 0.2 10*3/uL (ref 0.0–0.7)
HEMATOCRIT: 40.7 % (ref 36.0–46.0)
HEMOGLOBIN: 13.7 g/dL (ref 12.0–15.0)
Lymphs Abs: 2.5 10*3/uL (ref 0.7–4.0)
MCHC: 33.7 g/dL (ref 30.0–36.0)
MCV: 93.9 fl (ref 78.0–100.0)
MONO ABS: 0.4 10*3/uL (ref 0.1–1.0)
MONOS PCT: 9.5 % (ref 3.0–12.0)
Neutro Abs: 1 10*3/uL — ABNORMAL LOW (ref 1.4–7.7)
Neutrophils Relative %: 23.5 % — ABNORMAL LOW (ref 43.0–77.0)
Platelets: 256 10*3/uL (ref 150.0–400.0)
RBC: 4.33 Mil/uL (ref 3.87–5.11)
RDW: 12.9 % (ref 11.5–15.5)
WBC: 4.1 10*3/uL (ref 4.0–10.5)

## 2018-05-30 LAB — BASIC METABOLIC PANEL
BUN: 19 mg/dL (ref 6–23)
CALCIUM: 8.8 mg/dL (ref 8.4–10.5)
CO2: 30 meq/L (ref 19–32)
Chloride: 105 mEq/L (ref 96–112)
Creatinine, Ser: 0.63 mg/dL (ref 0.40–1.20)
GFR: 121.12 mL/min (ref >60.00)
GLUCOSE: 77 mg/dL (ref 70–99)
Potassium: 4.1 mEq/L (ref 3.5–5.1)
SODIUM: 139 meq/L (ref 135–145)

## 2018-05-30 LAB — HEPATIC FUNCTION PANEL
ALBUMIN: 3.8 g/dL (ref 3.5–5.2)
ALT: 30 U/L (ref 0–35)
AST: 23 U/L (ref 0–37)
Alkaline Phosphatase: 77 U/L (ref 39–117)
BILIRUBIN TOTAL: 0.4 mg/dL (ref 0.2–1.2)
Bilirubin, Direct: 0.1 mg/dL (ref 0.0–0.3)
Total Protein: 6.8 g/dL (ref 6.0–8.3)

## 2018-05-30 NOTE — Patient Instructions (Addendum)
Get mammogram .  Flu vaccine today .  shingrix vaccine at your pharmacy when avaiable.   Will order cologuard. Flu vaccine  today .  Will notify you  of labs when available.   Glad  You are doing well.   Preventive Care 17 Years and Older, Female Preventive care refers to lifestyle choices and visits with your health care provider that can promote health and wellness. What does preventive care include?  A yearly physical exam. This is also called an annual well check.  Dental exams once or twice a year.  Routine eye exams. Ask your health care provider how often you should have your eyes checked.  Personal lifestyle choices, including: ? Daily care of your teeth and gums. ? Regular physical activity. ? Eating a healthy diet. ? Avoiding tobacco and drug use. ? Limiting alcohol use. ? Practicing safe sex. ? Taking low-dose aspirin every day. ? Taking vitamin and mineral supplements as recommended by your health care provider. What happens during an annual well check? The services and screenings done by your health care provider during your annual well check will depend on your age, overall health, lifestyle risk factors, and family history of disease. Counseling Your health care provider may ask you questions about your:  Alcohol use.  Tobacco use.  Drug use.  Emotional well-being.  Home and relationship well-being.  Sexual activity.  Eating habits.  History of falls.  Memory and ability to understand (cognition).  Work and work Statistician.  Reproductive health.  Screening You may have the following tests or measurements:  Height, weight, and BMI.  Blood pressure.  Lipid and cholesterol levels. These may be checked every 5 years, or more frequently if you are over 59 years old.  Skin check.  Lung cancer screening. You may have this screening every year starting at age 47 if you have a 30-pack-year history of smoking and currently smoke or have quit  within the past 15 years.  Fecal occult blood test (FOBT) of the stool. You may have this test every year starting at age 35.  Flexible sigmoidoscopy or colonoscopy. You may have a sigmoidoscopy every 5 years or a colonoscopy every 10 years starting at age 72.  Hepatitis C blood test.  Hepatitis B blood test.  Sexually transmitted disease (STD) testing.  Diabetes screening. This is done by checking your blood sugar (glucose) after you have not eaten for a while (fasting). You may have this done every 1-3 years.  Bone density scan. This is done to screen for osteoporosis. You may have this done starting at age 26.  Mammogram. This may be done every 1-2 years. Talk to your health care provider about how often you should have regular mammograms.  Talk with your health care provider about your test results, treatment options, and if necessary, the need for more tests. Vaccines Your health care provider may recommend certain vaccines, such as:  Influenza vaccine. This is recommended every year.  Tetanus, diphtheria, and acellular pertussis (Tdap, Td) vaccine. You may need a Td booster every 10 years.  Varicella vaccine. You may need this if you have not been vaccinated.  Zoster vaccine. You may need this after age 36.  Measles, mumps, and rubella (MMR) vaccine. You may need at least one dose of MMR if you were born in 1957 or later. You may also need a second dose.  Pneumococcal 13-valent conjugate (PCV13) vaccine. One dose is recommended after age 8.  Pneumococcal polysaccharide (PPSV23) vaccine. One dose  is recommended after age 72.  Meningococcal vaccine. You may need this if you have certain conditions.  Hepatitis A vaccine. You may need this if you have certain conditions or if you travel or work in places where you may be exposed to hepatitis A.  Hepatitis B vaccine. You may need this if you have certain conditions or if you travel or work in places where you may be exposed  to hepatitis B.  Haemophilus influenzae type b (Hib) vaccine. You may need this if you have certain conditions.  Talk to your health care provider about which screenings and vaccines you need and how often you need them. This information is not intended to replace advice given to you by your health care provider. Make sure you discuss any questions you have with your health care provider. Document Released: 07/04/2015 Document Revised: 02/25/2016 Document Reviewed: 04/08/2015 Elsevier Interactive Patient Education  [employer].

## 2018-06-02 LAB — CYTOLOGY - PAP
Diagnosis: NEGATIVE
HPV: NOT DETECTED

## 2018-06-05 ENCOUNTER — Ambulatory Visit (INDEPENDENT_AMBULATORY_CARE_PROVIDER_SITE_OTHER)
Admission: RE | Admit: 2018-06-05 | Discharge: 2018-06-05 | Disposition: A | Discharge status: Home / Self Care | Payer: PPO | Source: Ambulatory Visit | Attending: Pulmonary Disease | Admitting: Pulmonary Disease

## 2018-06-05 ENCOUNTER — Ambulatory Visit: Payer: PPO | Admitting: Pulmonary Disease

## 2018-06-05 ENCOUNTER — Encounter: Payer: Self-pay | Admitting: Pulmonary Disease

## 2018-06-05 VITALS — BP 118/70 | HR 61 | Temp 97.9°F | Ht 61.0 in | Wt 114.0 lb

## 2018-06-05 DIAGNOSIS — D860 Sarcoidosis of lung: Secondary | ICD-10-CM

## 2018-06-05 DIAGNOSIS — D869 Sarcoidosis, unspecified: Secondary | ICD-10-CM | POA: Diagnosis not present

## 2018-06-05 DIAGNOSIS — D863 Sarcoidosis of skin: Secondary | ICD-10-CM

## 2018-06-05 NOTE — Patient Instructions (Addendum)
Today we updated your med list in our EPIC system...    Continue your current medications the same...  Today we checked your follow up CXR & sarcoid blood test...    We will contact you w/ the results when available...   Be sure to re-start your exercise program...  Call for any questions or if we can be of service in any way...  We discussed my up-coming retirement & we will arrange for a follow up appt in [id number] w/ my young partner, Dr. Mechele CollinJane, [place]...  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    Wishing you a very merry Christmas & good health + much happiness in the years to come.Marland Kitchen..Marland Kitchen

## 2018-06-06 ENCOUNTER — Telehealth: Payer: Self-pay | Admitting: Pulmonary Disease

## 2018-06-06 LAB — ANGIOTENSIN CONVERTING ENZYME: Angiotensin-Converting Enzyme: 33 U/L (ref 9–67)

## 2018-06-06 NOTE — Telephone Encounter (Signed)
Notes recorded by Jacquiline Doerogdon, Lisa M, LPN on 21/30/865712/17/2019 at 9:54 AM EST Attempted to call Patient. LMTCB 06/06/18. Will try again at a later time. ------  Notes recorded by Michele McalpineNadel, Scott M, MD on 06/06/2018 at 7:57 AM EST Please notify patient>  CXR is stable- normal heart size, sl incr markings c/w scar tissue from her sarcoid but NO CHANGE... Rec - continue yearly OV recheck w/ CXR...   Called and spoke with pt letting her know results of cxr. Pt expressed understanding. I did make an addendum to the recall and placed for pt to have cxr. Nothing further needed.

## 2018-06-09 NOTE — Progress Notes (Signed)
PAP is normal. HPV high  risk is negative

## 2018-06-19 DIAGNOSIS — Z1211 Encounter for screening for malignant neoplasm of colon: Secondary | ICD-10-CM | POA: Diagnosis not present

## 2018-06-19 DIAGNOSIS — Z1212 Encounter for screening for malignant neoplasm of rectum: Secondary | ICD-10-CM | POA: Diagnosis not present

## 2018-06-19 LAB — COLOGUARD: Cologuard: NEGATIVE

## 2018-06-27 ENCOUNTER — Encounter: Payer: Self-pay | Admitting: Pulmonary Disease

## 2018-06-27 NOTE — Progress Notes (Signed)
Subjective:       Patient ID: Courtney Bray, female   DOB: [birth year], 68 y.o.   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     TBBx RLL by DrClance 1994 showed noncaseating granulomatous inflamm c/w sarcoidosis...  CT Chest scans from 2002=>2004 showed bilat hilar & mediastinal adenopathy w/ interstitial lung changes of her known sarcoidosis; bilat upper lobe nodules R>L are unchanged serially likely representing sarcoidosis as well...   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  PFTs 03/15/06 showed FVC=2.59 (88%), FEV1=1.98 (89%), %1sec=76, mid-flows reduced at 61% predicted; TLC=3.45 (76%), RV=0.86 (53%), RV/TLC=25%; DLCO=101% predicted...  CXRs 2008=>2011 showed bilat hilar prom & stable RUL opac w/ scarring & retraction of the hilum...  PFTs 09/25/08 showed FVC=2.56 (88%), FEV1=2.06 (95%), %1sec=81, mid-flows wnl at 83% predicted; TLC=3.93 (87%), RV=1.26 (76%), RV/TLC=32%,  DLCO=89% predicted...    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 This primarily manifest as cough, and also skin involvement. She is  being followed closely at [facility] for her cutaneous sarcoid, and is being managed on Humira at this point. She has done very well with the medication, and it has also helped control her cough. The patient is very satisfied with her exertional tolerance at this time, and denies any issues with shortness of breath. As stated above, her cough has not been a problem since being on the Humira. She has not had a recent chest x-ray or pulmonary function studies.      IMP>  The patient is doing very well from a pulmonary standpoint. She is having no significant cough or dyspnea on exertion. I suspect the Humira is helping her pulmonary disease as well as her skin disease. She does need to have a followup chest x-ray and pulmonary function studies, and we will schedule these today (not done). I have asked her to stay as active as possible, and to call as if she sees any change in her breathing. She will followup with me in one year.      REC>  Will check cxr today, and call you with results. Please schedule followup breathing studies at your convenience over the next 1-2 mos. Will call you with results.  Followup with me in one year if doing well  CXR 08/31/11> IMP: History of sarcoidosis. Hilar prominence right greater than left may reflect adenopathy. Appear stable. Stable appearance of slight prominence of reticular interstitial markings. No new lesion is evident.  ~  03/15/13:  ROV w/ KC>        Patient comes in today for followup of her known sarcoidosis.  This primarily manifests as skin changes, as well as cough. She is being treated with Humira by her dermatologist, and this has also controlled her cough very well. She reports no shortness of breath, and has been staying very active. Her chest x-ray last year was totally stable, and her breathing test today are stable as well.      IMP>  The patient is doing very well from a sarcoid standpoint, with no increasing shortness of breath  or decreased exertional tolerance. Her x-ray has been stable, and although her total lung capacity it is a little lower, her diffusion capacity is completely normal and stable. Her cough and skin changes are totally controlled on Humira through her dermatologist. Overall, she is satisfied with her symptoms, and I have encouraged her to continue on an aggressive exercise and conditioning program. I would like to see her back in one year, but do not feel that she needs yearly x-rays and PFTs. Will simply do these periodically and as needed.      REC>  Your breathing studies are stable.Good news.Will check xrays and PFT's periodically rather than yearly. Stay as active as possible. Make sure you get your flu shot this fall. Follow up with me in one year, but please call if having increased symptoms.  PFT 03/15/13>  FVC=2.36 (103%), FEV1=1.94 (108%), %1sec=82, mid-flows wnl at 116% predicted;  TLC=3.07 (66%), RV=1.18 (62%), RV/TLC=39; DLCO=91% predicted...   ~  04/22/14:  ROV w/ KC>        The patient comes in today for follow-up of her known sarcoidosis. This is primarily manifesting as skin involvement, and the patient is on Humira through her dermatologist with significant control of pulmonary symptoms and skin changes. She denies any cough, and has had excellent exertional tolerance. She feels that she is asymptomatic from a pulmonary standpoint.      IMP>  The patient is doing very well from a pulmonary standpoint with respect to her sarcoidosis. She is continuing treatment by dermatology for her skin changes, and this in turn has resolved her pulmonary symptoms as well. I would like to see her back in one year to check on things, but she is to call if she has increased symptoms. I will check a chest x-ray at her next visit.      REC>  Continue on your medication thru your dermatologist. Please call if you feel you are having increased pulmonary symptoms. Followup with me in one year, and will check a  chest xray the same day.   ~  May 29, 2015:  1year ROV w/ SN>  Her PCP= DrPanosh => last seen 10/16 & they checked PPD skin test= neg (but control not used and some sarcoid pts are anergic).    Today I met Francis for the first time, reviewed her history, and composed this EPIC chart review... She notes min cough, no sput, denies SOB/ CP, no palpit or edema;  she is active- exercises, walking, does stairs ok, and not limited in any way...     Pulmonary sarcoidosis> Dx 1994 via TBBx showing noncaseating granulomatous inflammation; CT Chest scans showed bilat hilar & mediastinal adenopathy w/ interstitial lung changes w/ bilat upper lobe nodules R>L; PFTs were +/- wnl (poss mild restriction) and minimal pulmonary symptomatology;  She has never required treatment for her pulm dis and ACE levels were not followed...    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 EXAM shows Afeb, VSS, O2sat=97%;  HEENT- neg, mallampati1;  Chest- clear w/o w/r/r;  Heart- RR Gr1/6 SEM w/o r/g;  Abd- soft, neg;  Ext- neg w/o c/c/e;  Neuro- intact;  Derm- wig covers bald spot on scalp, prev plaques resolved.  CXR 05/29/15 showed norm heart size, stable coarse interstitial markings w/ R>L hilar enlargement & right perihilar opac & vol loss; no active infiltrate or edema; no change from 2013 films...  LABS 10/16 by DrPanosh showed FLP- ok;  Chems- wnl including LFTs;  CBC- wnl x WBC=3.4;  TSH=1.35  LABS 05/29/15:  ACE level = 42 (8-52) indicative of inactive sarcoid... IMP/PLAN>>  Vila remains essentially asymptomatic from the pulmonary standpoint; her CXR is stable and ACE level is wnl at 42; she remains on Humira for cutaneous sarcoid under the direction of [facility]; we discussed ROV in 59mo  ~  May 31, 2016:  Yearly ROV & f/u sarcoidosis>  BJacquitareturns for a routine 1473yrOV & f/u Sarcoid- states doing well, no new complaints or concerns, she's  had a good yr;  She remains [facility]oid w/ a good response...    [facility] & she is due for f/u soon>  Few papules on dorsal hands otherw neg; she is on Humira [id number], clobetasol, & kenalog as needed; no changes made...    She sees DrPanosh for PCP & had Medicare wellness exam 04/02/16> note reviewed, doing satis...    [facility]- felt to be reactive w/o any evid for lymphoprolif disorder; WBCs ranging 3.1 to 4.1, no change since records avail back to 2008, and f/u suggested prn... We reviewed the following medical problems during today's office visit >>     Pulmonary sarcoidosis> Dx 1994 via TBBx showing noncaseating granulomatous inflammation; CT Chest scans showed bilat hilar & mediastinal adenopathy w/ interstitial lung changes w/ bilat upper lobe nodules R>L; PFTs were +/- wnl (poss mild restriction) and minimal pulmonary symptomatology;  She has never required treatment for her pulm dis and ACE levels were not followed... She remains asymptomatic.    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 EXAM shows Afeb, VSS, O2sat=98%;  HEENT- neg, mallampati1;  Chest- clear w/o w/r/r;  Heart- RR Gr1/6 SEM w/o r/g;  Abd- soft, neg;  Ext- neg w/o c/c/e;  Neuro- intact;  Derm- wig covers bald spot on scalp, prev plaques resolved.  CXR 05/31/16 (independently reviewed by me in the PACS system) showed norm heart size, some calcif in the wall of the Ao arch, diffuse incr interstitial markings as before- no change, prom right hilar structures, chr changes c/w sarcoid- NAD...  Labs 05/31/16>  ACE level= 39 IMP/PLAN>>  Hx pulm & cutaneous sarcoid- stable & doing well on Humira shots every other week;  She remains asymptomatic from the pulm standpoint & has never required specific therapy for lung  involvement; she knows to watch for incr cough/ SOB/ chest discomfort & call me anytime... We plan routine ROV w/ CXR & ACE level in 73yr38yr ~  June 30, 2017:  Yearly ROV & f/u sarcoidosis>  BrenRichelurns feeling well, reports a good year, no new complaints or concerns; she notes min cough, no sput or blood, denies SOB & exercises by walking (& shoveled snow!), housework, & caring for her elderly  mother; she is not limited in activities in any way; denies CP, palpit, dizzy, edema, etc... SHE REMAINS ON HUMIRA (75m every other week) [facility] [facility] for hx cutaneous sarcoid w/ yearly follow up visits... We reviewed the following medical problems during today's office visit >>     Pulmonary sarcoidosis> Dx 1994 via TBBx showing noncaseating granulomatous inflammation; CT Chest scans showed bilat hilar & mediastinal adenopathy w/ interstitial lung changes w/ bilat upper lobe nodules R>L; PFTs were +/- wnl (poss mild restriction) and minimal pulmonary symptomatology;  She has never required treatment for her pulm dis and ACE levels have since all been wnl... She remains asymptomatic.    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 EXAM shows Afeb, VSS, O2sat=97%;  HEENT- neg, mallampati1;  Chest- clear w/o w/r/r;  Heart- RR Gr1/6 SEM w/o r/g;  Abd- soft, neg;  Ext- neg w/o c/c/e;  Neuro- intact;  Derm- wig covers bald spot on scalp, prev plaques resolved.  CXR 06/30/17> norm heart size, bilat hilar fullness and incr interstitial markings & scarring in RUL- no interval changes compared to 2017...  LABS 06/30/17> ACE level = 29 (9-67) IMP/PLAN>>  BKashlynhas Sarcoidosis & is treated w/ Humira & a topical cream for her cutaneous dis;  She remains asymptomatic from the pulmonary standpoint and her CXR remains unchanged withg a norm ACE level;  Felt to have inactive lung dis & she is advised to exercise regularly  and continue the Humira per DERM;  We will plan ROV recheck in 112yrsooner if needed for any resp symptoms...    ~  June 05, 2018:  118monthV and pulmonary follow up for her sarcoidosis>  BreKrislynports a good year and remains asymptomatic save for a min cough, denies sputum or hemoptysis, no SOB/ DOE, no CP/ palpit/ etc; she has been caring for her 91 7o mother w/ dementia (total care pt) and BreAnniells me that she passed away last month after a hip fracture; BreDemetrians to start back into a regular exercise program as a new year's resolution...  We reviewed the following interval medical notes in [place]>      [facility]>  Hx cutaneous sarcoid well controlled on Humira [id number], plus triamcinolone cream & clobetasol cream for her hands and LEs;  No signs of active cutaneous sarcoid, she has some mild eczema on feet (dyshidrotic dermatitis);  They did a Quant-gold assay & it returned NEG...     She saw her PCP- DrPanosh on 05/30/18>  Medicare wellness visit- note reviewed, no concerns identified, labs done (see below)...  We reviewed the following medical problems during today's office visit>      Pulmonary sarcoidosis> Dx 1994 via TBBx showing noncaseating granulomatous inflammation; CT Chest scans showed bilat hilar & mediastinal adenopathy w/ interstitial lung changes w/ bilat upper lobe nodules R>L; PFTs were +/- wnl (poss mild restriction) and minimal pulmonary symptomatology;  She has never required treatment for her pulm dis and ACE levels have since all been wnl... She remains asymptomatic.    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 EXAM shows Afeb, VSS, O2sat=97%;  HEENT- neg, mallampati1;  Chest- clear w/o w/r/r;  Heart- RR Gr1/6 SEM w/o r/g;  Abd- soft, neg;  Ext- neg w/o c/c/e;  Neuro- intact;  Derm- wig covers bald spot on scalp, prev plaques resolved.  LABS  05/2018 by DrPanosh>  FLP- ok w/ TChol 202, TG 69, HDL 65, LDL 123;  Chems- wnl w/ K=4.1, BS=77, Cr=0.63, LFTs wnl;  CBC- ok w/ Hg=13.7, WBC=4.1 (5%eos);  Last TSH 03/2016 = 1.53  CXR 06/05/18 (independently reviewed by me in the PACS system) shows norm heart size, mild aortic atherosclerosis, stable bilat hilar prominence & incr interstitial markings similar to prev films...   LAB 06/05/18> ACE level = 33 IMP/PLAN>>  Lesli has pulm & cutaneous sarcoidosis- pulm dis manifested w/ abn CXR showing bilat hilar adenopathy & chr interstitial prominence- stable w/o progression, ACE remains normal at 33, and she is essentially asymptomatic;  Her cutaneous dis is well controlled on the Humira Qod per [facility], DrHuang;  She is asked to continue same management & rec to incr exercise program;  She knows to call or any new symptoms- cough, sputum, hemoptysis, SOB, etc- otherw we will have her follow up w/ one of my young partners in 3yras I will be retiring in Jan2020...    Past Medical History:  Diagnosis Date  . Chicken pox   . Lymphocytosis   . Osteoporosis   . Sarcoidosis 02/24/2007   Dxed 1994 by TBBX + ISLD/LN on cxr Primarily manifests as cough and skin changes [facility]  PFT's 2010:  No obstruction, TLC 3.93 (87%), DLCO 89%. PFT's 2014:  No obstruction, TLC 3.07, DLCO 91%      Past Surgical History:  Procedure Laterality Date  . TUBAL LIGATION  1979  . UTERINE ARTERY EMBOLIZATION  2000    Outpatient Encounter Medications as of 06/05/2018  Medication Sig  . clobetasol cream (TEMOVATE) 0.05 % Apply topically as needed.  .Marland KitchenHUMIRA PEN 40 MG/0.8ML PNKT Inject every other week for sarcoidosis   No facility-administered encounter medications on file as of 06/05/2018.     Allergies  Allergen Reactions  . Amoxicillin Rash  . Vitamin D Analogs Rash    high dose  Pill had rash     Immunization History  Administered Date(s) Administered  . Influenza, High Dose  Seasonal PF 05/30/2018  . PPD Test 05/11/2013, 04/01/2014, 04/01/2015, 05/29/2015, 04/08/2017, 05/02/2017  . Pneumococcal Conjugate-13 04/02/2016  . Pneumococcal Polysaccharide-23 09/05/2012, 10/19/2017  . Tdap 02/20/2007  . Zoster 04/22/2011    Current Medications, Allergies, Past Medical History, Past Surgical History, Family History, and Social History were reviewed in [employer]record.   Review of Systems             All symptoms NEG except where BOLDED >>  Constitutional:  F/C/S, fatigue, anorexia, unexpected weight change. HEENT:  HA, visual changes, hearing loss, earache, nasal symptoms, sore throat, mouth sores, hoarseness. Resp:  cough, sputum, hemoptysis; SOB, tightness, wheezing. Cardio:  CP, palpit, DOE, orthopnea, edema. GI:  N/V/D/C, blood in stool; reflux, abd pain, distention, gas. GU:  dysuria, freq, urgency, hematuria, flank pain, voiding difficulty. MS:  joint pain, swelling, tenderness, decr ROM; neck pain, back pain, etc. Neuro:  HA, tremors, seizures, dizziness, syncope, weakness, numbness, gait abn. Skin:  suspicious lesions or skin rash, scalp plaques have improved on Rx. Heme:  adenopathy, bruising, bleeding. Psyche:  confusion, agitation, sleep disturbance, hallucinations, anxiety, depression suicidal.   Objective:   Physical Exam       Vital Signs:  Reviewed...   General:  WD, WN, 68y/o F in NAD; alert & oriented; pleasant &  cooperative... HEENT:  [place]/AT; Conjunctiva- pink, Sclera- nonicteric, EOM-wnl, PERRLA, Fundi-benign; EACs-clear, TMs-wnl; NOSE-clear; THROAT-clear & wnl.  Neck:  Supple w/ fair ROM; no JVD; normal carotid impulses w/o bruits; no thyromegaly or nodules palpated; no lymphadenopathy palpated. Chest:  Clear to P & A; without wheezes, rales, or rhonchi heard. Heart:  Regular Rhythm; norm S1 & S2 Gr1/6 SEM w/o rubs or gallops detected. Abdomen:  Soft & nontender- no guarding or rebound; normal bowel sounds; no  organomegaly or masses palpated. Ext:  Normal ROM; without deformities or arthritic changes; no varicose veins, venous insuffic, or edema;  Pulses intact w/o bruits. Neuro:  CNs II-XII intact; motor testing normal; sensory testing normal; gait normal & balance OK. Derm:  No lesions noted; no rash etc; she is wearing a wig that covers her bald spot. Lymph:  No cervical, supraclavicular, axillary, or inguinal adenopathy palpated.   Assessment:      IMP >>     Pulmonary sarcoidosis>  She remains essentially asymptomatic, CXR is stable, ACE level is wnl;  We will continue to follow...    Cutaneous sarcoidosis>  Followed by Kennieth Rad on Humira and improved, no new skin lesons...    Medical issues:  Followed by DrPanosh.  PLAN >>  05/29/15>   Quynh remains essentially asymptomatic from the pulmonary standpoint; her CXR is stable and ACE level is wnl at 42; she remains on Humira for cutaneous sarcoid under the direction of [facility]... 05/31/16>   Hx pulm & cutaneous sarcoid- stable & doing well on Humira shots every other week;  She remains asymptomatic from the pulm standpoint & has never required specific therapy for lung involvement; she knows to watch for incr cough/ SOB/ chest discomfort & call me anytime... We plan routine ROV w/ CXR & ACE level in 15yr.. 06/30/17>   BWillodeanhas Sarcoidosis & is treated w/ Humira & a topical cream for her cutaneous dis;  She remains asymptomatic from the pulmonary standpoint and her CXR remains unchanged withg a norm ACE level;  Felt to have inactive lung dis & she is advised to exercise regularly and continue the Humira per DERM;  We will plan ROV recheck in 175yrsooner if needed for any resp symptoms 06/05/18>   BrQuentinas pulm & cutaneous sarcoidosis- pulm dis manifested w/ abn CXR showing bilat hilar adenopathy & chr interstitial prominence- stable w/o progression, ACE remains normal at 33, and she is essentially asymptomatic;  Her cutaneous dis is well controlled  on the Humira Qod per [facility], DrHuang;  She is asked to continue same management & rec to incr exercise program;  She knows to call or any new symptoms- cough, sputum, hemoptysis, SOB, etc- otherw we will have her follow up w/ one of my young partners in 1y83yr I will be retiring in JanMelrose   Patient's Medications  New Prescriptions   No medications on file  Previous Medications   CLOBETASOL CREAM (TEMOVATE) 0.05 %    Apply topically as needed.   HUMIRA PEN 40 MG/0.8ML PNKT    Inject every other week for sarcoidosis  Modified Medications   No medications on file  Discontinued Medications   No medications on file

## 2018-07-03 ENCOUNTER — Ambulatory Visit: Payer: PPO | Admitting: Pulmonary Disease

## 2018-07-17 ENCOUNTER — Telehealth: Payer: Self-pay

## 2018-07-17 NOTE — Telephone Encounter (Signed)
please find results as per patient request  I dont see them

## 2018-07-17 NOTE — Telephone Encounter (Signed)
Please advise  Copied from CRM [id number]. Topic: General - Other >> Jul 17, 2018 12:16 PM Percival Spanish wrote:  Pt call to say she received a letter from [facility] saying they sent the results to Dr Fabian Sharp and she would like the results

## 2018-07-19 NOTE — Telephone Encounter (Signed)
Have not had the cologuard  Pass through my desk have looked through al the paperwork will continue to look through it

## 2018-07-25 NOTE — Telephone Encounter (Signed)
Pt is calling and would like [facility] results ?

## 2018-07-27 NOTE — Telephone Encounter (Signed)
Patient is calling back because she needs to get her [employer]og[facility] results.  She was told that it was faxed to the office in January but no one has called her to give the results.  Patient has also left the [employer] # [phone] if you need to get in touch with them.  Please advise and call patient back at [phone].

## 2018-07-27 NOTE — Telephone Encounter (Signed)
Called [facility] and they said it was negative result

## 2018-07-27 NOTE — Telephone Encounter (Signed)
Patient Is calling back upset and states she is needing her result for her [facility]ard. She stated she doesn't want to wait for the fax because that was sent  In January. She will be call back with the contact number to call. Please advise

## 2018-07-27 NOTE — Telephone Encounter (Signed)
Informed pt I have not had results show up but can be due to our fax machine being down but when fax machine is up and running I will call [facility] to get results

## 2018-07-27 NOTE — Telephone Encounter (Signed)
Called pt to notify her of result that it was negative. Pt has understanding nothing further needed

## 2018-07-28 ENCOUNTER — Encounter: Payer: Self-pay | Admitting: Internal Medicine

## 2018-08-25 ENCOUNTER — Telehealth: Payer: Self-pay

## 2018-08-25 NOTE — Telephone Encounter (Signed)
Author phoned pt. to reschedule awv as health coach will not be available on 5/6. Appointment rescheduled for 6/30.

## 2018-10-25 ENCOUNTER — Ambulatory Visit: Payer: PPO

## 2018-11-09 ENCOUNTER — Encounter: Payer: Self-pay | Admitting: Internal Medicine

## 2018-12-08 IMAGING — DX DG CHEST 2V
2 series · 2 of 2 positions shown · non-contrast
Comparison: PA and lateral chest x-ray May 29, 2015

CLINICAL DATA: History of sarcoidosis.  Currently asymptomatic.

EXAM:
CHEST  2 VIEW

[chest pa]
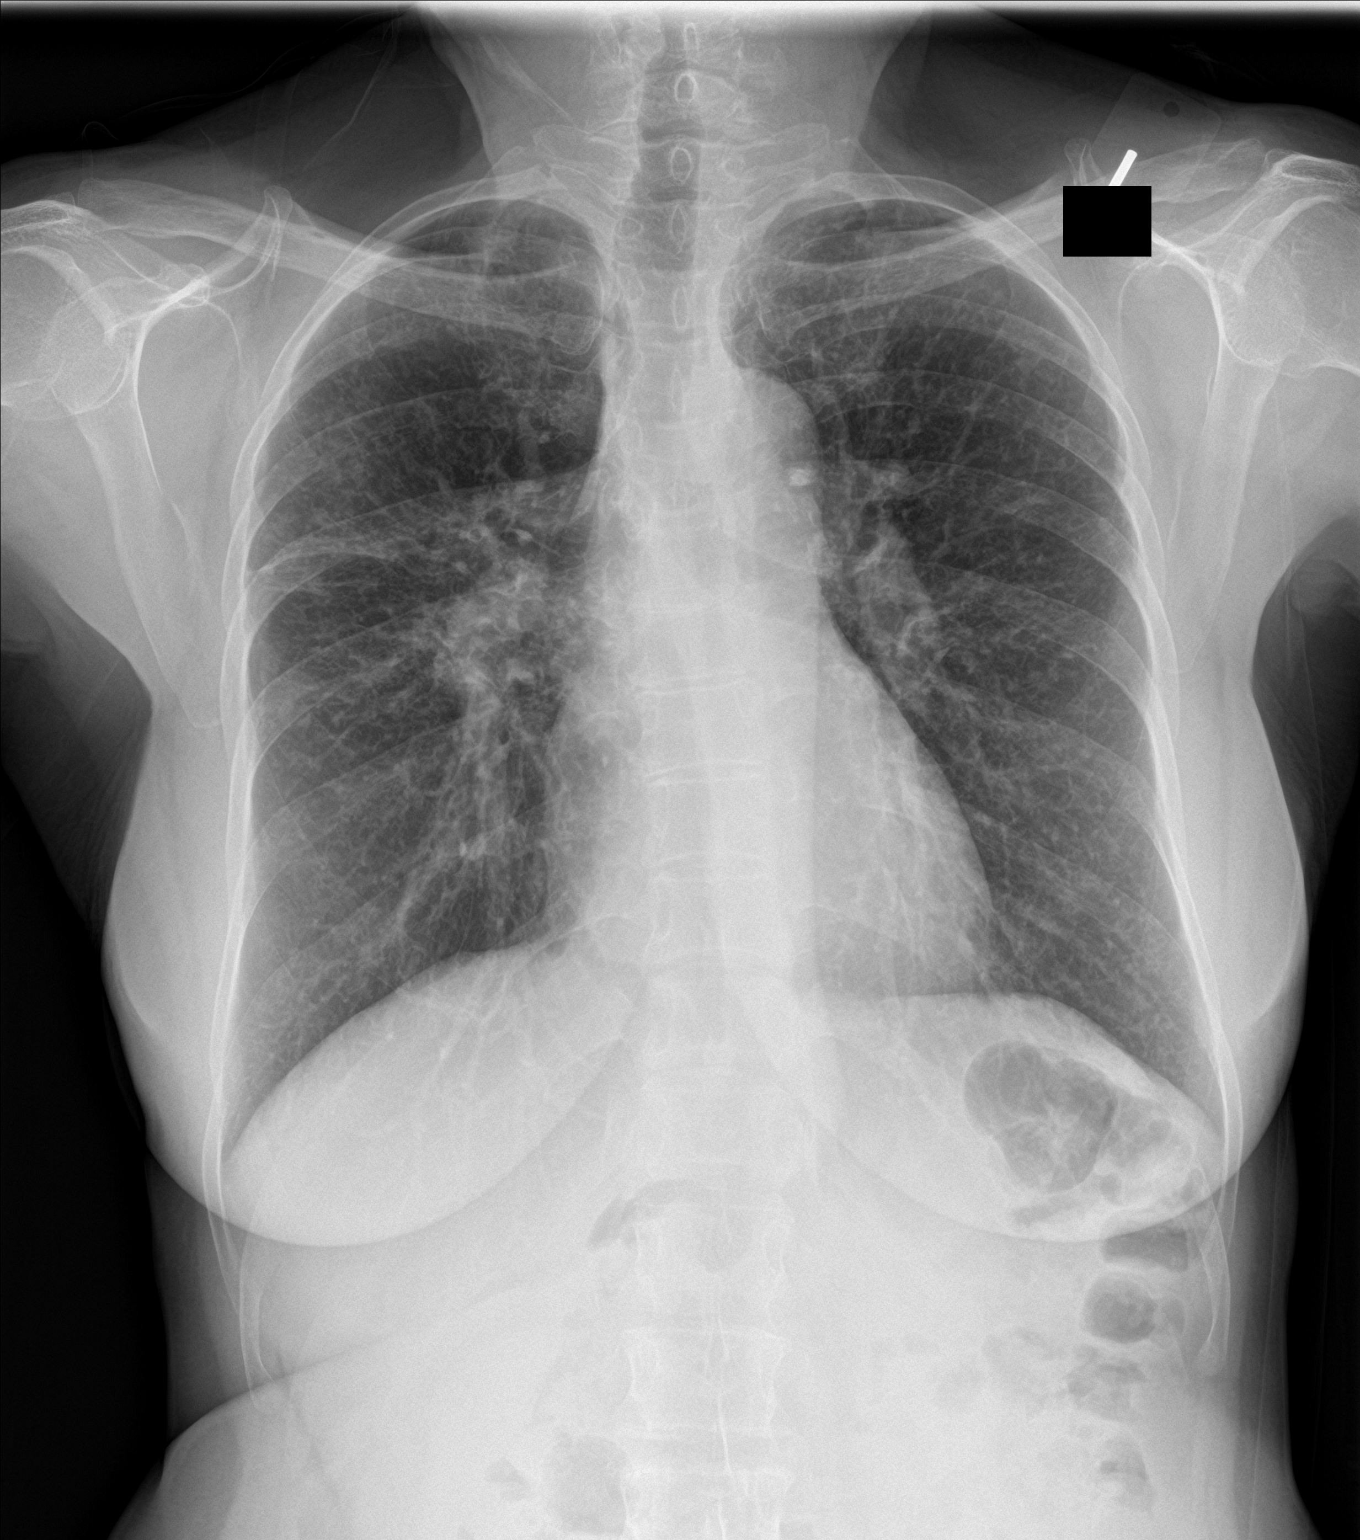

[chest lat]
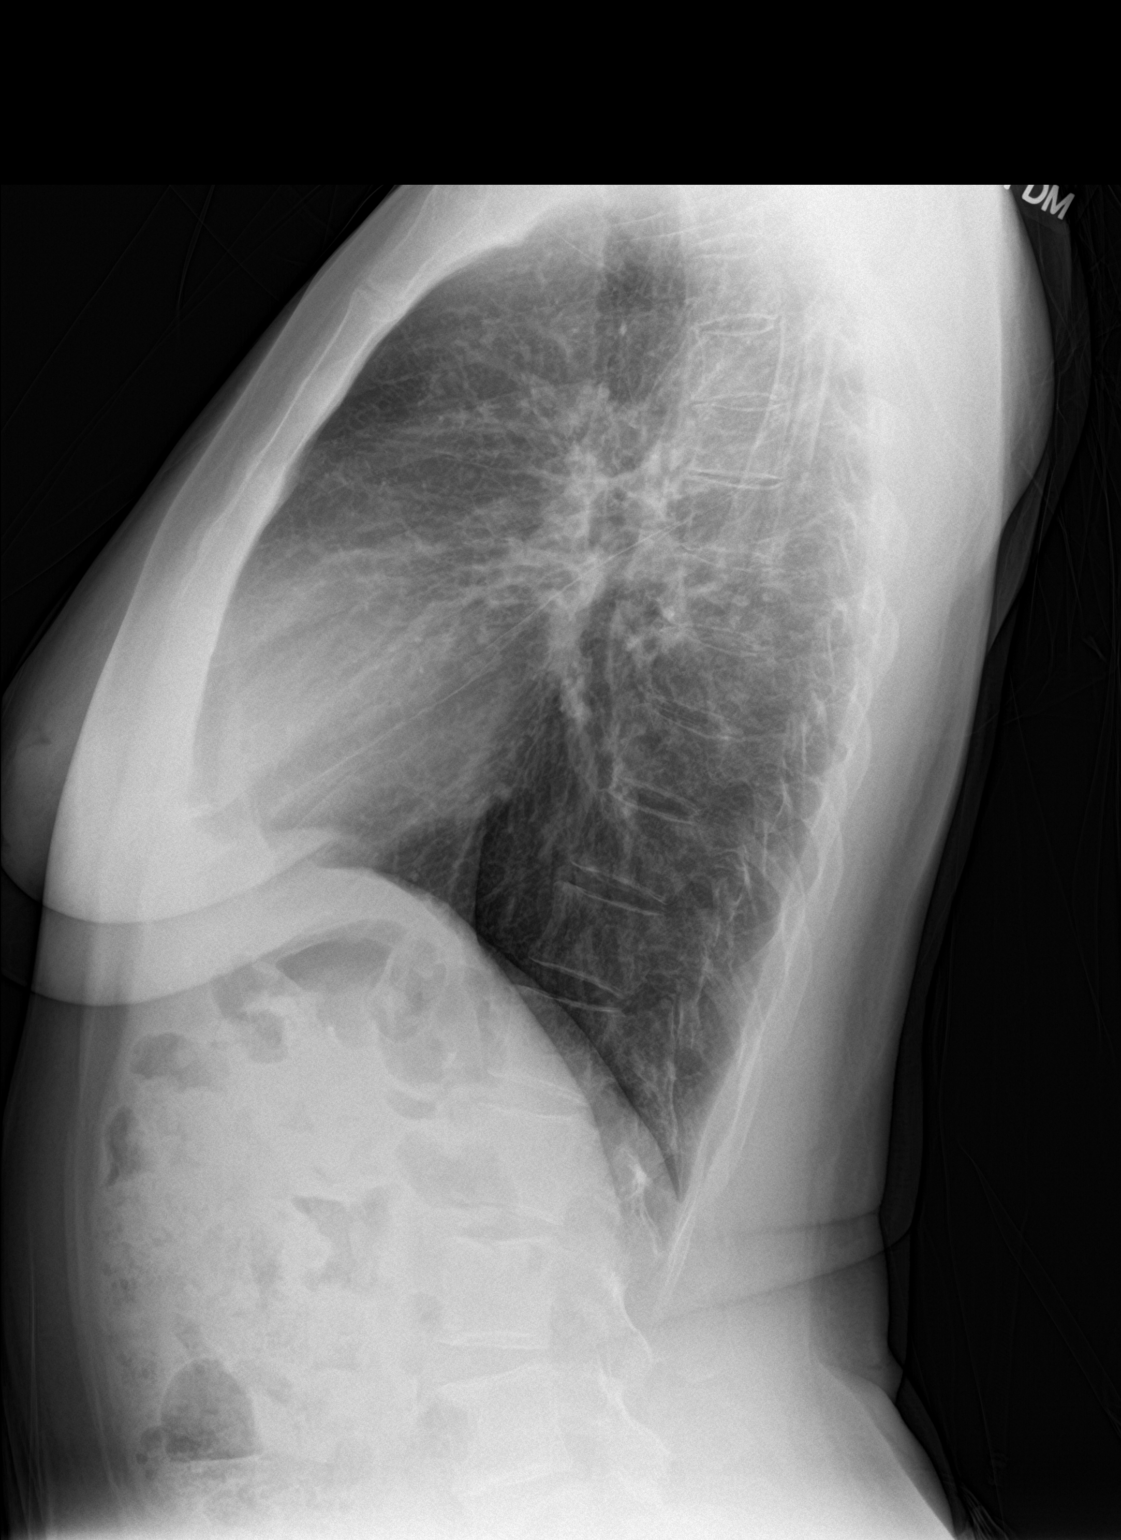

[2 of 2 positions shown; findings below may reference images not displayed]

FINDINGS: The lungs are well-expanded. There is no focal infiltrate. The
interstitial markings are increased diffusely. There is prominence
of the right hilar structures which is stable. There is some
thickening of the minor fissure. There is no pleural effusion. The
heart and pulmonary vascularity are normal. There is calcification
in the wall of the aortic arch. The bony thorax is unremarkable.
IMPRESSION: Chronic interstitial changes compatible with known sarcoidosis. No
acute cardiopulmonary abnormality.

Thoracic aortic atherosclerosis.

## 2018-12-19 ENCOUNTER — Ambulatory Visit: Payer: PPO

## 2019-01-25 ENCOUNTER — Ambulatory Visit: Payer: PPO

## 2019-04-05 ENCOUNTER — Ambulatory Visit (INDEPENDENT_AMBULATORY_CARE_PROVIDER_SITE_OTHER): Payer: PPO

## 2019-04-05 ENCOUNTER — Other Ambulatory Visit: Payer: Self-pay

## 2019-04-05 DIAGNOSIS — Z23 Encounter for immunization: Secondary | ICD-10-CM

## 2019-05-15 DIAGNOSIS — Z7189 Other specified counseling: Secondary | ICD-10-CM | POA: Diagnosis not present

## 2019-05-15 DIAGNOSIS — D869 Sarcoidosis, unspecified: Secondary | ICD-10-CM | POA: Diagnosis not present

## 2019-05-15 DIAGNOSIS — Z5181 Encounter for therapeutic drug level monitoring: Secondary | ICD-10-CM | POA: Diagnosis not present

## 2019-05-15 DIAGNOSIS — Z79899 Other long term (current) drug therapy: Secondary | ICD-10-CM | POA: Diagnosis not present

## 2019-06-11 NOTE — Progress Notes (Deleted)
No chief complaint on file.   HPI: Courtney Bray 68 y.o. comes in today for Preventive Medicare exam .  Vit d Sarcoid skin and lung    Health Maintenance  Topic Date Due  . Hepatitis C Screening  [birth year]  . TETANUS/TDAP  02/19/2017  . MAMMOGRAM  04/08/2018  . COLONOSCOPY  02/15/2019  . INFLUENZA VACCINE  Completed  . DEXA SCAN  Completed  . PNA vac Low Risk Adult  Completed   Health Maintenance Review LIFESTYLE:  Exercise:   Tobacco/ETS: Alcohol:  Sugar beverages: Sleep: Drug use: no HH:      Hearing:   Vision:  No limitations at present . Last eye check UTD  Safety:  Has smoke detector and wears seat belts.  No firearms. No excess sun exposure. Sees dentist regularly.  Falls:    Memory: Felt to be good  , no concern from her or her family.  Depression: No anhedonia unusual crying or depressive symptoms  Nutrition: Eats well balanced diet; adequate calcium and vitamin D. No swallowing chewing problems.  Injury: no major injuries in the last six months.  Other healthcare providers:  Reviewed today .  Social:  Lives with spouse married. No pets. Preventive parameters:   Reviewed   ADLS:   There are no problems or need for assistance  driving, feeding, obtaining food, dressing, toileting and bathing, managing money using phone. She is independent. EXERCISE/ HABITS  Per week   No tobacco    eto  ROS:  GEN/ HEENT: No fever, significant weight changes sweats headaches vision problems hearing changes, CV/ PULM; No chest pain shortness of breath cough, syncope,edema  change in exercise tolerance. GI /GU: No adominal pain, vomiting, change in bowel habits. No blood in the stool. No significant GU symptoms. SKIN/HEME: ,no acute skin rashes suspicious lesions or bleeding. No lymphadenopathy, nodules, masses.  NEURO/ PSYCH:  No neurologic signs such as weakness numbness. No depression anxiety. IMM/ Allergy: No unusual infections.  Allergy .   REST of 12  system review negative except as per HPI   Past Medical History:  Diagnosis Date  . Chicken pox   . Lymphocytosis   . Osteoporosis   . Sarcoidosis 02/24/2007   Dxed 1994 by TBBX + ISLD/LN on cxr Primarily manifests as cough and skin changes Tx with Humira by derm at [facility].  PFT's 2010:  No obstruction, TLC 3.93 (87%), DLCO 89%. PFT's 2014:  No obstruction, TLC 3.07, DLCO 91%      Family History  Problem Relation Age of Onset  . Heart disease Father   . Stroke Father   . Dementia Father   . Dementia Mother   . High blood pressure Mother   . High blood pressure Sister        with hypertesnive renal disease    Social History   Socioeconomic History  . Marital status: Married    Spouse name: Not on file  . Number of children: Not on file  . Years of education: Not on file  . Highest education level: Not on file  Occupational History  . Not on file  Tobacco Use  . Smoking status: Never Smoker  . Smokeless tobacco: Never Used  Substance and Sexual Activity  . Alcohol use: Yes    Alcohol/week: 0.0 standard drinks    Comment: Occasional  . Drug use: No  . Sexual activity: Yes    Partners: Male  Other Topics Concern  . 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   Orig from [place]   in [place]    Social Determinants of Health   Financial Resource Strain:   . Difficulty of Paying Living Expenses: Not on file  Food Insecurity:   . Worried About Charity fundraiser in the Last Year: Not on file  . Ran Out of Food in the Last Year: Not on file  Transportation Needs:   . Lack of Transportation (Medical): Not on file  . Lack of Transportation (Non-Medical): Not on file    Physical Activity:   . Days of Exercise per Week: Not on file  . Minutes of Exercise per Session: Not on file  Stress:   . Feeling of Stress : Not on file  Social Connections:   . Frequency of Communication with Friends and Family: Not on file  . Frequency of Social Gatherings with Friends and Family: Not on file  . Attends Religious Services: Not on file  . Active Member of Clubs or Organizations: Not on file  . Attends Archivist Meetings: Not on file  . Marital Status: Not on file    Outpatient Encounter Medications as of 06/12/2019  Medication Sig  . clobetasol cream (TEMOVATE) 0.05 % Apply topically as needed.  Marland Kitchen HUMIRA PEN 40 MG/0.8ML PNKT Inject every other week for sarcoidosis   No facility-administered encounter medications on file as of 06/12/2019.    EXAM:  There were no vitals taken for this visit.  There is no height or weight on file to calculate BMI.  Physical Exam: Vital signs reviewed [id number] is a well-developed well-nourished alert cooperative   who appears stated age in no acute distress.  HEENT: normocephalic atraumatic , Eyes: PERRL EOM's full, conjunctiva clear, , Ears: no deformity EAC's clear TMs with normal landmarks. Mouth: masked  NECK: supple without masses, thyromegaly or bruits. CHEST/PULM:  Clear to auscultation and percussion breath sounds equal no wheeze , rales or rhonchi. No chest wall deformities or tenderness. CV: PMI is nondisplaced, S1 S2 no gallops, murmurs, rubs. Peripheral pulses are full without delay.No JVD .  ABDOMEN: Bowel sounds normal nontender  No guard or rebound, no hepato splenomegal no CVA tenderness.   Extremtities:  No clubbing cyanosis or edema, no acute joint swelling or redness no focal atrophy NEURO:  Oriented x3, cranial nerves 3-12 appear to be intact, no obvious focal weakness,gait within normal limits no abnormal reflexes or asymmetrical SKIN: No acute rashes normal turgor, color, no bruising or  petechiae. PSYCH: Oriented, good eye contact, no obvious depression anxiety, cognition and judgment appear normal. LN: no cervical axillary inguinal adenopathy No noted deficits in memory, attention, and speech.   Lab Results  Component Value Date   WBC 4.1 05/30/2018   HGB 13.7 05/30/2018   HCT 40.7 05/30/2018   PLT 256.0 05/30/2018   GLUCOSE 77 05/30/2018   CHOL 202 (H) 05/30/2018   TRIG 69.0 05/30/2018   HDL 65.30 05/30/2018   LDLCALC 123 (H) 05/30/2018   ALT 30 05/30/2018   AST 23 05/30/2018   NA 139 05/30/2018   K  4.1 05/30/2018   CL 105 05/30/2018   CREATININE 0.63 05/30/2018   BUN 19 05/30/2018   CO2 30 05/30/2018   TSH 1.53 03/26/2016    ASSESSMENT AND PLAN:  Discussed the following assessment and plan:  Visit for preventive health examination  Medication management  Sarcoidosis  High risk medication use  Lymphocytosis  Elevated LDL cholesterol level Yearly lab   ldl 123   Med monitoring Patient Care Team: Carmello Cabiness, Neta MendsWanda K, MD as PCP - General (Internal Medicine) Kandice HamsHuang, William Wei-Ting, MD as Referring Physician (Dermatology) Michele McalpineNadel, Scott M, MD as Consulting Physician (Pulmonary Disease)  There are no Patient Instructions on file for this visit.  Neta MendsWanda K. Aniah Pauli M.D.

## 2019-06-12 ENCOUNTER — Other Ambulatory Visit: Payer: Self-pay

## 2019-06-12 ENCOUNTER — Encounter: Payer: PPO | Admitting: Internal Medicine

## 2019-06-12 DIAGNOSIS — E559 Vitamin D deficiency, unspecified: Secondary | ICD-10-CM

## 2019-06-12 DIAGNOSIS — Z79899 Other long term (current) drug therapy: Secondary | ICD-10-CM

## 2019-06-12 DIAGNOSIS — D869 Sarcoidosis, unspecified: Secondary | ICD-10-CM

## 2019-06-12 DIAGNOSIS — D72829 Elevated white blood cell count, unspecified: Secondary | ICD-10-CM

## 2019-06-26 ENCOUNTER — Other Ambulatory Visit: Payer: Self-pay

## 2019-06-26 ENCOUNTER — Other Ambulatory Visit (INDEPENDENT_AMBULATORY_CARE_PROVIDER_SITE_OTHER): Payer: PPO

## 2019-06-26 DIAGNOSIS — D869 Sarcoidosis, unspecified: Secondary | ICD-10-CM

## 2019-06-26 DIAGNOSIS — D72829 Elevated white blood cell count, unspecified: Secondary | ICD-10-CM

## 2019-06-26 DIAGNOSIS — Z79899 Other long term (current) drug therapy: Secondary | ICD-10-CM | POA: Diagnosis not present

## 2019-06-26 DIAGNOSIS — E559 Vitamin D deficiency, unspecified: Secondary | ICD-10-CM

## 2019-06-26 LAB — CBC WITH DIFFERENTIAL/PLATELET
Basophils Absolute: 0 10*3/uL (ref 0.0–0.1)
Basophils Relative: 1 % (ref 0.0–3.0)
Eosinophils Absolute: 0.3 10*3/uL (ref 0.0–0.7)
Eosinophils Relative: 7 % — ABNORMAL HIGH (ref 0.0–5.0)
HCT: 43.5 % (ref 36.0–46.0)
Hemoglobin: 14.6 g/dL (ref 12.0–15.0)
Lymphocytes Relative: 62.2 % — ABNORMAL HIGH (ref 12.0–46.0)
Lymphs Abs: 2.5 10*3/uL (ref 0.7–4.0)
MCHC: 33.5 g/dL (ref 30.0–36.0)
MCV: 94.2 fl (ref 78.0–100.0)
Monocytes Absolute: 0.4 10*3/uL (ref 0.1–1.0)
Monocytes Relative: 10.7 % (ref 3.0–12.0)
Neutro Abs: 0.8 10*3/uL — ABNORMAL LOW (ref 1.4–7.7)
Neutrophils Relative %: 19.6 % — ABNORMAL LOW (ref 43.0–77.0)
Platelets: 269 10*3/uL (ref 150.0–400.0)
RBC: 4.62 Mil/uL (ref 3.87–5.11)
RDW: 12.7 % (ref 11.5–15.5)
WBC: 4 10*3/uL (ref 4.0–10.5)

## 2019-06-26 LAB — COMPREHENSIVE METABOLIC PANEL
ALT: 20 U/L (ref 0–35)
AST: 19 U/L (ref 0–37)
Albumin: 4 g/dL (ref 3.5–5.2)
Alkaline Phosphatase: 87 U/L (ref 39–117)
BUN: 11 mg/dL (ref 6–23)
CO2: 28 mEq/L (ref 19–32)
Calcium: 9.3 mg/dL (ref 8.4–10.5)
Chloride: 103 mEq/L (ref 96–112)
Creatinine, Ser: 0.7 mg/dL (ref 0.40–1.20)
GFR: 100.59 mL/min (ref >60.00)
Glucose, Bld: 90 mg/dL (ref 70–99)
Potassium: 3.7 mEq/L (ref 3.5–5.1)
Sodium: 139 mEq/L (ref 135–145)
Total Bilirubin: 0.5 mg/dL (ref 0.2–1.2)
Total Protein: 7.2 g/dL (ref 6.0–8.3)

## 2019-06-26 LAB — LIPID PANEL
Cholesterol: 206 mg/dL — ABNORMAL HIGH (ref 0–200)
HDL: 66.7 mg/dL (ref >39.00)
LDL Cholesterol: 127 mg/dL — ABNORMAL HIGH (ref 0–99)
NonHDL: 139.51
Total CHOL/HDL Ratio: 3
Triglycerides: 63 mg/dL (ref 0.0–149.0)
VLDL: 12.6 mg/dL (ref 0.0–40.0)

## 2019-06-26 LAB — VITAMIN D 25 HYDROXY (VIT D DEFICIENCY, FRACTURES): VITD: 14.83 ng/mL — ABNORMAL LOW (ref 30.00–100.00)

## 2019-06-29 ENCOUNTER — Encounter: Payer: Self-pay | Admitting: Internal Medicine

## 2019-06-29 ENCOUNTER — Other Ambulatory Visit: Payer: Self-pay

## 2019-06-29 ENCOUNTER — Ambulatory Visit (INDEPENDENT_AMBULATORY_CARE_PROVIDER_SITE_OTHER): Payer: PPO | Admitting: Internal Medicine

## 2019-06-29 VITALS — BP 122/76 | HR 83 | Temp 97.7°F | Ht 61.0 in | Wt 117.8 lb

## 2019-06-29 DIAGNOSIS — Z0000 Encounter for general adult medical examination without abnormal findings: Secondary | ICD-10-CM

## 2019-06-29 DIAGNOSIS — D869 Sarcoidosis, unspecified: Secondary | ICD-10-CM | POA: Diagnosis not present

## 2019-06-29 DIAGNOSIS — E559 Vitamin D deficiency, unspecified: Secondary | ICD-10-CM | POA: Diagnosis not present

## 2019-06-29 DIAGNOSIS — E7800 Pure hypercholesterolemia, unspecified: Secondary | ICD-10-CM | POA: Diagnosis not present

## 2019-06-29 DIAGNOSIS — D72820 Lymphocytosis (symptomatic): Secondary | ICD-10-CM | POA: Diagnosis not present

## 2019-06-29 NOTE — Progress Notes (Signed)
This visit occurred during the SARS-CoV-2 public health emergency.  Safety protocols were in place, including screening questions prior to the visit, additional usage of staff PPE, and extensive cleaning of exam room while observing appropriate contact time as indicated for disinfecting solutions.    Chief Complaint  Patient presents with  . Annual Exam    Pt has no concerns    HPI: Courtney Bray 69 y.o. comes in today for Preventive Medicare exam/ wellness visit .  Sarcoid  : dr Courtney Bray retired no sx  Concern  Needs to fu with new pulm provider   Skin sarcoid  Seeing Dr Courtney Bray.  Yearly no concerns   Vit D   Cod liver oil   Intolerant of prev vit d preps  Staying active  Using port bidet for hemorrhoids said had a small crack recently getting better   Doing well with this  No change in bowel habits   Health Maintenance  Topic Date Due  . Hepatitis C Screening  [birth year]  . TETANUS/TDAP  02/19/2017  . MAMMOGRAM  04/08/2018  . COLONOSCOPY  02/15/2019  . INFLUENZA VACCINE  Completed  . DEXA SCAN  Completed  . PNA vac Low Risk Adult  Completed   Health Maintenance Review LIFESTYLE:  Exercise:  Active 5 days per week.  Tobacco/ETS: no Alcohol:  Mod Sugar beverages: no Sleep:about 6   HH: 2  Dog    Hearing:    Ok   Vision:  No limitations at present . Last eye check UTD  Safety:  Has smoke detector and wears seat belts.  No firearms. No excess sun exposure. Sees dentist regularly.  Falls:  no  Depression: No anhedonia unusual crying or depressive symptoms  Nutrition: Eats well balanced diet; adequate calcium and vitamin D. No swallowing chewing problems.  Injury: no major injuries in the last six months.  Other healthcare providers:  Reviewed today .  Preventive parameters: up-to-date  Reviewed   ADLS:   There are no problems or need for assistance  driving, feeding, obtaining food, dressing, toileting and bathing, managing money using phone. She is  independent.    ROS:  GEN/ HEENT: No fever, significant weight changes sweats headaches vision problems hearing changes, CV/ PULM; No chest pain shortness of breath cough, syncope,edema  change in exercise tolerance. GI /GU: No adominal pain, vomiting, change in bowel habits. No blood in the stool. No significant GU symptoms. SKIN/HEME: ,no acute skin rashes suspicious lesions or bleeding. No lymphadenopathy, nodules, masses.  NEURO/ PSYCH:  No neurologic signs such as weakness numbness. No depression anxiety. IMM/ Allergy: No unusual infections.  Allergy .   REST of 12 system review negative except as per HPI   Past Medical History:  Diagnosis Date  . Chicken pox   . Lymphocytosis   . Osteoporosis   . Sarcoidosis 02/24/2007   Dxed 1994 by TBBX + ISLD/LN on cxr Primarily manifests as cough and skin changes Tx with Humira by derm at [facility].  PFT's 2010:  No obstruction, TLC 3.93 (87%), DLCO 89%. PFT's 2014:  No obstruction, TLC 3.07, DLCO 91%      Family History  Problem Relation Age of Onset  . Heart disease Father   . Stroke Father   . Dementia Father   . Dementia Mother   . High blood pressure Mother   . High blood pressure Sister        with hypertesnive renal disease      Outpatient Encounter Medications  as of 06/29/2019  Medication Sig  . clobetasol cream (TEMOVATE) 0.05 % Apply topically as needed.  Marland Kitchen HUMIRA PEN 40 MG/0.8ML PNKT Inject every other week for sarcoidosis   No facility-administered encounter medications on file as of 06/29/2019.    EXAM:  BP 122/76 (BP Location: Right Arm, Patient Position: Sitting, Cuff Size: Normal)   Pulse 83   Temp 97.7 F (36.5 C) (Temporal)   Ht 5\' 1"  (1.549 m)   Wt 117 lb 12.8 oz (53.4 kg)   SpO2 97%   BMI 22.26 kg/m   Body mass index is 22.26 kg/m.  Physical Exam: Vital signs reviewed [id number] is a well-developed well-nourished alert cooperative   who appears stated age in no acute distress.  HEENT: normocephalic  atraumatic , Eyes: PERRL EOM's full, conjunctiva clear,, Ears: no deformity EAC's clear TMs with normal landmarks. Mouth: masked NECK: supple without masses, thyromegaly or bruits. CHEST/PULM:  Clear to auscultation and percussion breath sounds equal no wheeze , rales or rhonchi. No chest wall deformities or tenderness. Breast  Right nipple inverted( old no masses  And axilla clear   CV: PMI is nondisplaced, S1 S2 no gallops, murmurs, rubs. Peripheral pulses are full without delay.No JVD .  ABDOMEN: Bowel sounds normal nontender  No guard or rebound, no hepato splenomegal no CVA tenderness.   Extremtities:  No clubbing cyanosis or edema, no acute joint swelling or redness no focal atrophy NEURO:  Oriented x3, cranial nerves 3-12 appear to be intact, no obvious focal weakness,gait within normal limits no abnormal reflexes or asymmetrical SKIN: No acute rashes normal turgor, color, no bruising or petechiae. PSYCH: Oriented, good eye contact, no obvious depression anxiety, cognition and judgment appear normal. LN: no cervical axillary inguinal adenopathy No noted deficits in memory, attention, and speech.   Lab Results  Component Value Date   WBC 4.0 06/26/2019   HGB 14.6 06/26/2019   HCT 43.5 06/26/2019   PLT 269.0 06/26/2019   GLUCOSE 90 06/26/2019   CHOL 206 (H) 06/26/2019   TRIG 63.0 06/26/2019   HDL 66.70 06/26/2019   LDLCALC 127 (H) 06/26/2019   ALT 20 06/26/2019   AST 19 06/26/2019   NA 139 06/26/2019   K 3.7 06/26/2019   CL 103 06/26/2019   CREATININE 0.70 06/26/2019   BUN 11 06/26/2019   CO2 28 06/26/2019   TSH 1.53 03/26/2016   Lab reviewed with patient  ASSESSMENT AND PLAN:  Discussed the following assessment and plan:  Visit for preventive health examination  Lymphocytosis  Elevated LDL cholesterol level  Sarcoidosis  Vitamin D deficiency - has had se of oral supplements disc and can re check  - Plan: VITAMIN D 25 Hydroxy (Vit-D De future 6-12 mos GET mammo       Says has had  cologuard and is utd  On this  Make appt with Dr Courtney Bray as per Dr Courtney Bray for routine fu sarcoid   No sx today  Patient Care Team: Courtney Bray, Courtney Brooking, MD as PCP - General (Internal Medicine) Garry Heater, MD as Referring Physician (Dermatology) Noralee Space, MD as Consulting Physician (Pulmonary Disease)  Patient Instructions  Covid vaccine when available ,    shingrix at your pharmacy .  Retry vit d supplementations etc and can get vit d level    To recheck after 3 months of intervention. Continue lifestyle intervention healthy eating and exercise .    [facility]   Make pulmonary appt  with Dr Everardo All  In the pulmonary divisition as per Dr Kirstie Mirza advice .   cpx in a year or a sneeded    Vitamin D Deficiency Vitamin D deficiency is when your body does not have enough vitamin D. Vitamin D is important to your body for many reasons:  It helps the body absorb two important minerals--calcium and phosphorus.  It plays a role in bone health.  It may help to prevent some diseases, such as diabetes and multiple sclerosis.  It plays a role in muscle function, including heart function. If vitamin D deficiency is severe, it can cause a condition in which your bones become soft. In adults, this condition is called osteomalacia. In children, this condition is called rickets. What are the causes? This condition may be caused by:  Not eating enough foods that contain vitamin D.  Not getting enough natural sun exposure.  Having certain digestive system diseases that make it difficult for your body to absorb vitamin D. These diseases include Crohn's disease, chronic pancreatitis, and cystic fibrosis.  Having a surgery in which a part of the stomach or a part of the small intestine is removed.  Having chronic kidney disease or liver disease. What increases the risk? You are more likely to develop this condition if you:  Are  older.  Do not spend much time outdoors.  Live in a long-term care facility.  Have had broken bones.  Have weak or thin bones (osteoporosis).  Have a disease or condition that changes how the body absorbs vitamin D.  Have dark skin.  Take certain medicines, such as steroid medicines or certain seizure medicines.  Are overweight or obese. What are the signs or symptoms? In mild cases of vitamin D deficiency, there may not be any symptoms. If the condition is severe, symptoms may include:  Bone pain.  Muscle pain.  Falling often.  Broken bones caused by a minor injury. How is this diagnosed? This condition may be diagnosed with blood tests. Imaging tests such as X-rays may also be done to look for changes in the bone. How is this treated? Treatment for this condition may depend on what caused the condition. Treatment options include:  Taking vitamin D supplements. Your health care provider will suggest what dose is best for you.  Taking a calcium supplement. Your health care provider will suggest what dose is best for you. Follow these instructions at home: Eating and drinking   Eat foods that contain vitamin D. Choices include: ? Fortified dairy products, cereals, or juices. Fortified means that vitamin D has been added to the food. Check the label on the package to see if the food is fortified. ? Fatty fish, such as salmon or trout. ? Eggs. ? Oysters. ? Mushrooms. The items listed above may not be a complete list of recommended foods and beverages. Contact a dietitian for more information. General instructions  Take medicines and supplements only as told by your health care provider.  Get regular, safe exposure to natural sunlight.  Do not use a tanning bed.  Maintain a healthy weight. Lose weight if needed.  Keep all follow-up visits as told by your health care provider. This is important. How is this prevented? You can get vitamin D by:  Eating foods that  naturally contain vitamin D.  Eating or drinking products that have been fortified with vitamin D, such as cereals, juices, and dairy products (including milk).  Taking a vitamin D supplement or a multivitamin supplement that  contains vitamin D.  Being in the sun. Your body naturally makes vitamin D when your skin is exposed to sunlight. Your body changes the sunlight into a form of the vitamin that it can use. Contact a health care provider if:  Your symptoms do not go away.  You feel nauseous or you vomit.  You have fewer bowel movements than usual or are constipated. Summary  Vitamin D deficiency is when your body does not have enough vitamin D.  Vitamin D is important to your body for good bone health and muscle function, and it may help prevent some diseases.  Vitamin D deficiency is primarily treated through supplementation. Your health care provider will suggest what dose is best for you.  You can get vitamin D by eating foods that contain vitamin D, by being in the sun, and by taking a vitamin D supplement or a multivitamin supplement that contains vitamin D. This information is not intended to replace advice given to you by your health care provider. Make sure you discuss any questions you have with your health care provider. Document Revised: 02/13/2018 Document Reviewed: 02/13/2018 Elsevier Patient Education  2020 [employer].    [place] K. Caryl Fate Cellucci M.D.

## 2019-06-29 NOTE — Patient Instructions (Signed)
Covid vaccine when available ,    shingrix at your pharmacy .  Retry vit d supplementations etc and can get vit d level    To recheck after 3 months of intervention. Continue lifestyle intervention healthy eating and exercise .    [facility]   Make pulmonary appt with Dr Loanne Drilling  In the pulmonary divisition as per Dr Brendolyn Patty advice .   cpx in a year or a sneeded    Vitamin D Deficiency Vitamin D deficiency is when your body does not have enough vitamin D. Vitamin D is important to your body for many reasons:  It helps the body absorb two important minerals--calcium and phosphorus.  It plays a role in bone health.  It may help to prevent some diseases, such as diabetes and multiple sclerosis.  It plays a role in muscle function, including heart function. If vitamin D deficiency is severe, it can cause a condition in which your bones become soft. In adults, this condition is called osteomalacia. In children, this condition is called rickets. What are the causes? This condition may be caused by:  Not eating enough foods that contain vitamin D.  Not getting enough natural sun exposure.  Having certain digestive system diseases that make it difficult for your body to absorb vitamin D. These diseases include Crohn's disease, chronic pancreatitis, and cystic fibrosis.  Having a surgery in which a part of the stomach or a part of the small intestine is removed.  Having chronic kidney disease or liver disease. What increases the risk? You are more likely to develop this condition if you:  Are older.  Do not spend much time outdoors.  Live in a long-term care facility.  Have had broken bones.  Have weak or thin bones (osteoporosis).  Have a disease or condition that changes how the body absorbs vitamin D.  Have dark skin.  Take certain medicines, such as steroid medicines or certain seizure medicines.  Are overweight or obese. What are  the signs or symptoms? In mild cases of vitamin D deficiency, there may not be any symptoms. If the condition is severe, symptoms may include:  Bone pain.  Muscle pain.  Falling often.  Broken bones caused by a minor injury. How is this diagnosed? This condition may be diagnosed with blood tests. Imaging tests such as X-rays may also be done to look for changes in the bone. How is this treated? Treatment for this condition may depend on what caused the condition. Treatment options include:  Taking vitamin D supplements. Your health care provider will suggest what dose is best for you.  Taking a calcium supplement. Your health care provider will suggest what dose is best for you. Follow these instructions at home: Eating and drinking   Eat foods that contain vitamin D. Choices include: ? Fortified dairy products, cereals, or juices. Fortified means that vitamin D has been added to the food. Check the label on the package to see if the food is fortified. ? Fatty fish, such as salmon or trout. ? Eggs. ? Oysters. ? Mushrooms. The items listed above may not be a complete list of recommended foods and beverages. Contact a dietitian for more information. General instructions  Take medicines and supplements only as told by your health care provider.  Get regular, safe exposure to natural sunlight.  Do not use a tanning bed.  Maintain a healthy weight. Lose weight if needed.  Keep all follow-up visits as  told by your health care provider. This is important. How is this prevented? You can get vitamin D by:  Eating foods that naturally contain vitamin D.  Eating or drinking products that have been fortified with vitamin D, such as cereals, juices, and dairy products (including milk).  Taking a vitamin D supplement or a multivitamin supplement that contains vitamin D.  Being in the sun. Your body naturally makes vitamin D when your skin is exposed to sunlight. Your body changes  the sunlight into a form of the vitamin that it can use. Contact a health care provider if:  Your symptoms do not go away.  You feel nauseous or you vomit.  You have fewer bowel movements than usual or are constipated. Summary  Vitamin D deficiency is when your body does not have enough vitamin D.  Vitamin D is important to your body for good bone health and muscle function, and it may help prevent some diseases.  Vitamin D deficiency is primarily treated through supplementation. Your health care provider will suggest what dose is best for you.  You can get vitamin D by eating foods that contain vitamin D, by being in the sun, and by taking a vitamin D supplement or a multivitamin supplement that contains vitamin D. This information is not intended to replace advice given to you by your health care provider. Make sure you discuss any questions you have with your health care provider. Document Revised: 02/13/2018 Document Reviewed: 02/13/2018 Elsevier Patient Education  2020 [employer].

## 2019-07-23 DIAGNOSIS — S0502XA Injury of conjunctiva and corneal abrasion without foreign body, left eye, initial encounter: Secondary | ICD-10-CM | POA: Diagnosis not present

## 2019-07-25 DIAGNOSIS — S0502XD Injury of conjunctiva and corneal abrasion without foreign body, left eye, subsequent encounter: Secondary | ICD-10-CM | POA: Diagnosis not present

## 2019-07-29 ENCOUNTER — Ambulatory Visit: Payer: PPO

## 2019-08-01 DIAGNOSIS — S0502XD Injury of conjunctiva and corneal abrasion without foreign body, left eye, subsequent encounter: Secondary | ICD-10-CM | POA: Diagnosis not present

## 2019-08-14 ENCOUNTER — Ambulatory Visit: Payer: PPO

## 2019-09-11 ENCOUNTER — Telehealth: Payer: Self-pay | Admitting: Internal Medicine

## 2019-09-11 NOTE — Telephone Encounter (Signed)
Noted  

## 2019-09-11 NOTE — Telephone Encounter (Signed)
Pt is wanting to get the COVID antibody test.   Informed pt that Panosh is on vacation this week and it maybe until next week that we follow up with her. Pt understood.   Pt can be reached at [phone]

## 2019-09-14 NOTE — Telephone Encounter (Signed)
Please advise 

## 2019-09-16 NOTE — Telephone Encounter (Signed)
Need more information   There are different type of antibodies orders. Is she sick? Contact patient and get more inforamtion

## 2019-09-17 NOTE — Telephone Encounter (Signed)
Spoke to pt and she stated that she read it on T.V. but now she has thought about it and she does not need it now.

## 2019-09-17 NOTE — Telephone Encounter (Signed)
Noted  

## 2019-10-16 ENCOUNTER — Other Ambulatory Visit (HOSPITAL_COMMUNITY): Payer: Self-pay | Admitting: *Deleted

## 2020-01-03 ENCOUNTER — Ambulatory Visit: Payer: PPO | Admitting: Emergency Medicine

## 2020-01-07 IMAGING — DX DG CHEST 2V
2 series · 2 of 2 positions shown · non-contrast
Comparison: 05/31/2016.

CLINICAL DATA: History of sarcoidosis.

EXAM:
CHEST  2 VIEW

[chest pa]
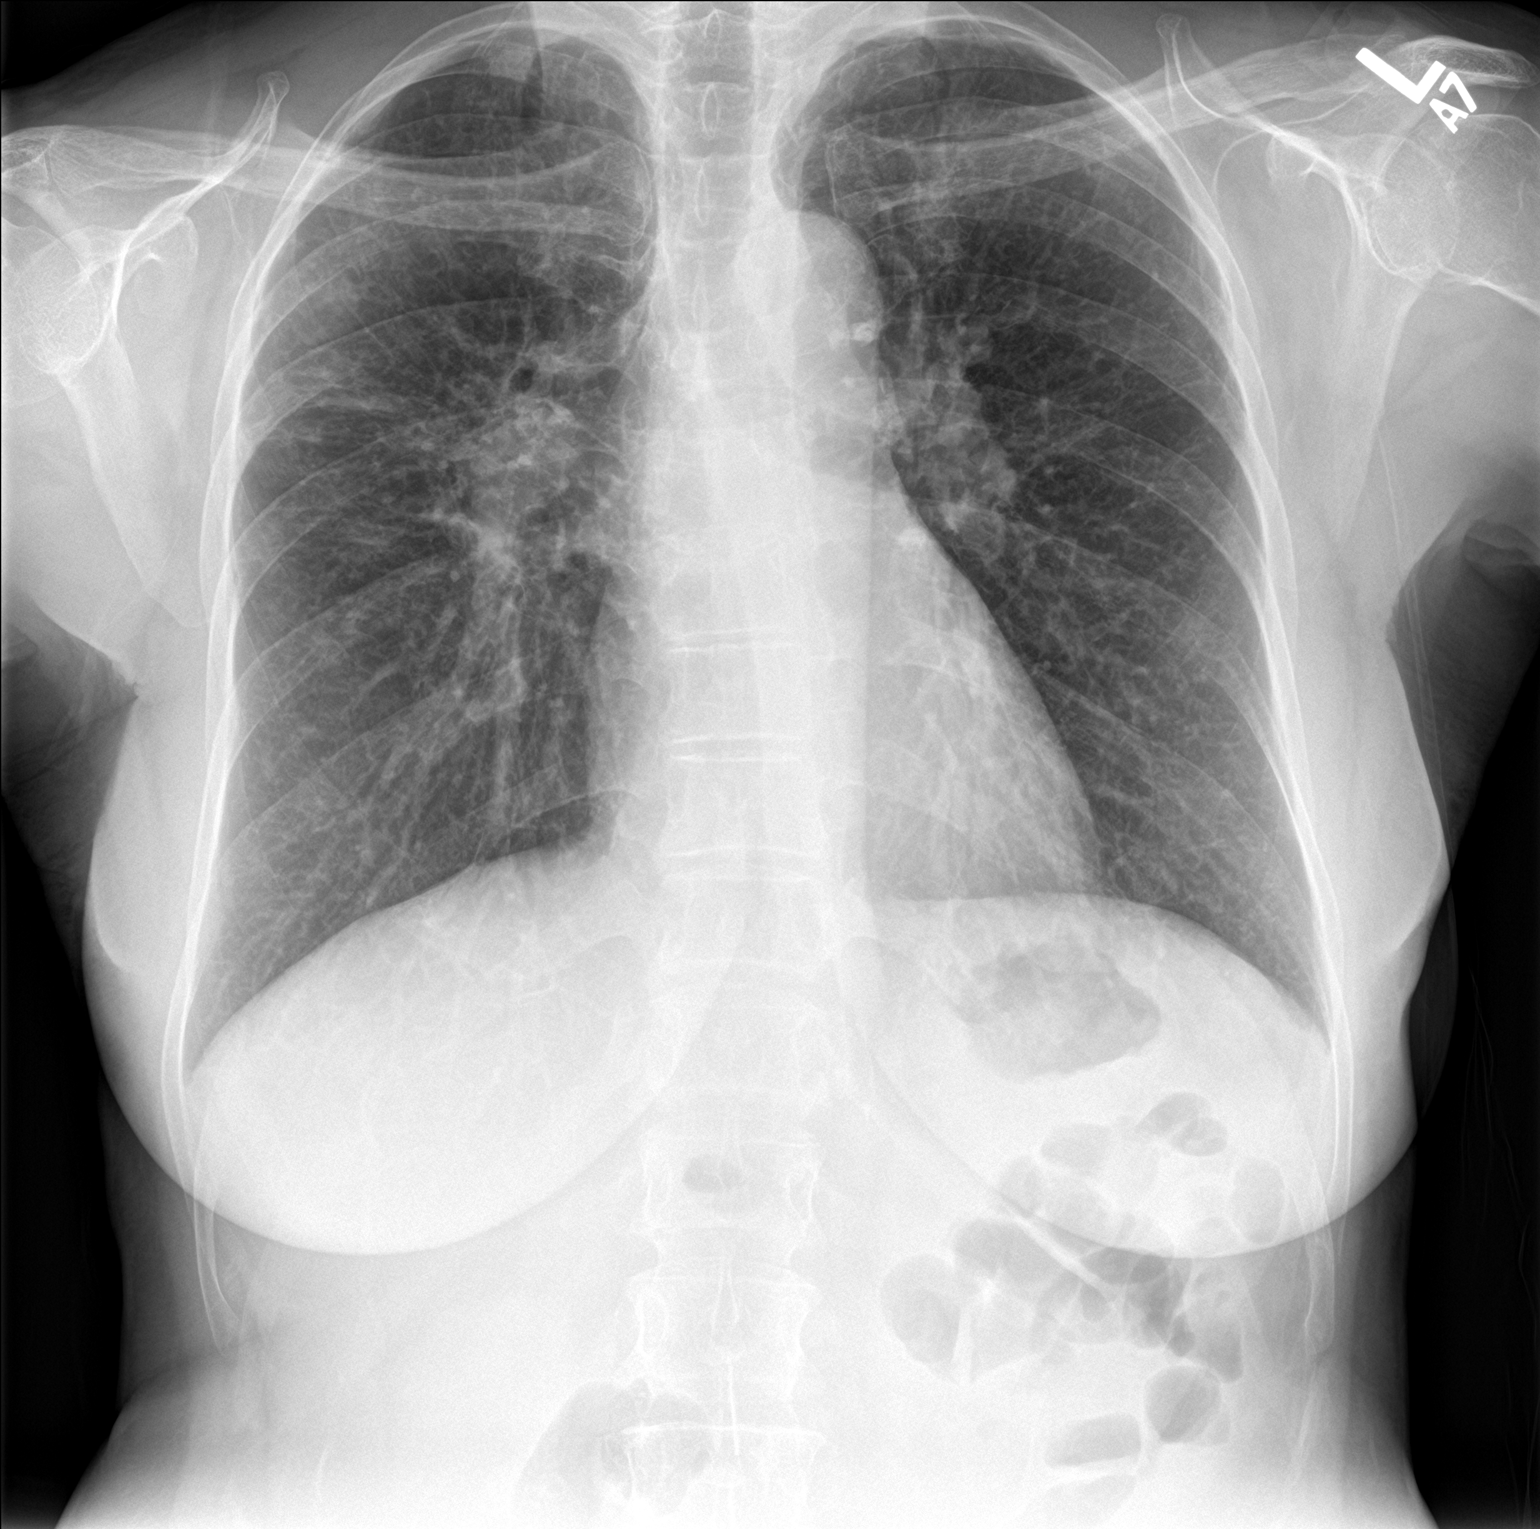

[chest lat]
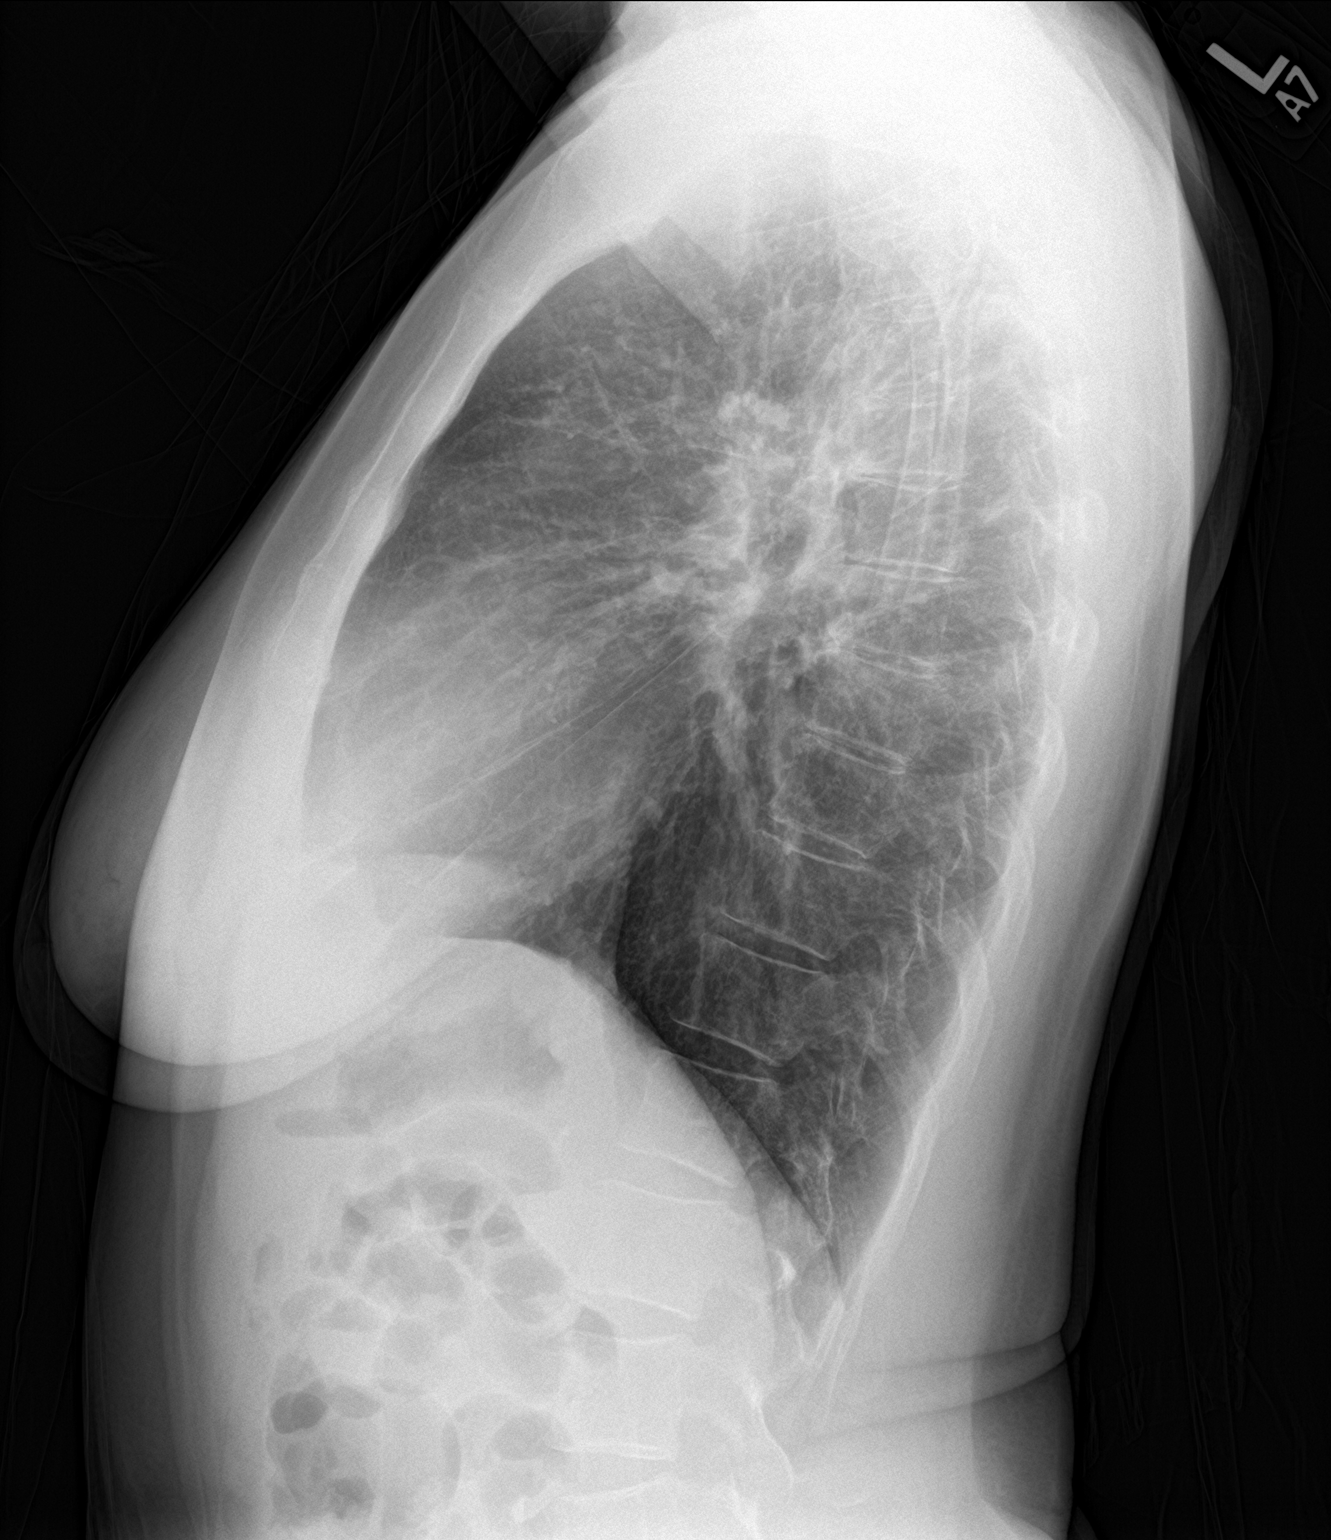

[2 of 2 positions shown; findings below may reference images not displayed]

FINDINGS: Mediastinum is stable. Bilateral hilar fullness consistent
adenopathy is again noted. Heart size normal. Bilateral interstitial
prominence noted consistent with chronic interstitial disease of
sarcoidosis. No interim change noted. No focal alveolar infiltrate.
Pleuroparenchymal thickening consistent scarring noted. No acute
bony abnormality.
IMPRESSION: Stable changes of bilateral hilar adenopathy, interstitial lung
disease, pleural-parenchymal scarring consistent with the patient's
known diagnosis of sarcoidosis. No acute abnormality. Chest is
stable from 05/31/2016.

## 2020-01-17 ENCOUNTER — Encounter: Payer: Self-pay | Admitting: Pulmonary Disease

## 2020-01-17 ENCOUNTER — Other Ambulatory Visit: Payer: Self-pay

## 2020-01-17 ENCOUNTER — Ambulatory Visit: Payer: PPO | Admitting: Pulmonary Disease

## 2020-01-17 VITALS — BP 140/80 | HR 72 | Temp 98.2°F | Ht 61.5 in | Wt 116.4 lb

## 2020-01-17 DIAGNOSIS — D860 Sarcoidosis of lung: Secondary | ICD-10-CM | POA: Diagnosis not present

## 2020-01-17 DIAGNOSIS — D863 Sarcoidosis of skin: Secondary | ICD-10-CM

## 2020-01-17 NOTE — Progress Notes (Signed)
Courtney Bray    [id number]    1950/08/09  Primary Care Physician:Panosh, Neta Mends, MD  Referring Physician: Madelin Headings, MD [address] [place],  [place] [address]  Chief complaint:   Patient with a history of sarcoidosis No symptoms are present  HPI:  Followed by Dr. Jackquline Bosch for many years  Has symptom of sarcoidosis in the past was coughing Started Humira for skin involvement about 10 years ago and symptoms have been well controlled since then  She is not short of breath Has no chest pressure  Walks about 2 to 3 miles a day Feels well without significant concerns  Came in for routine evaluation today   Outpatient Encounter Medications as of 01/17/2020  Medication Sig  . clobetasol cream (TEMOVATE) 0.05 % Apply topically as needed.  Marland Kitchen HUMIRA PEN 40 MG/0.8ML PNKT Inject every other week for sarcoidosis  . Vitamin D, Ergocalciferol, (DRISDOL) 1.25 MG (50000 UNIT) CAPS capsule Take 50,000 Units by mouth every 7 (seven) days. Takes daily   No facility-administered encounter medications on file as of 01/17/2020.    Allergies as of 01/17/2020 - Review Complete 01/17/2020  Allergen Reaction Noted  . Amoxicillin Rash 08/09/2017  . Vitamin d analogs Rash 04/05/2015    Past Medical History:  Diagnosis Date  . Chicken pox   . Lymphocytosis   . Osteoporosis   . Sarcoidosis 02/24/2007   Dxed 1994 by TBBX + ISLD/LN on cxr Primarily manifests as cough and skin changes Tx with Humira by derm at [facility].  PFT's 2010:  No obstruction, TLC 3.93 (87%), DLCO 89%. PFT's 2014:  No obstruction, TLC 3.07, DLCO 91%      Past Surgical History:  Procedure Laterality Date  . TUBAL LIGATION  1979  . UTERINE ARTERY EMBOLIZATION  2000    Family History  Problem Relation Age of Onset  . Heart disease Father   . Stroke Father   . Dementia Father   . Dementia Mother   . High blood pressure Mother   . High blood pressure Sister        with hypertesnive renal  disease    Social History   Socioeconomic History  . Marital status: Married    Spouse name: Not on file  . Number of children: Not on file  . Years of education: Not on file  . Highest education level: Not on file  Occupational History  . Not on file  Tobacco Use  . Smoking status: Never Smoker  . Smokeless tobacco: Never Used  Vaping Use  . Vaping Use: Never used  Substance and Sexual Activity  . Alcohol use: Yes    Alcohol/week: 0.0 standard drinks    Comment: Occasional  . Drug use: No  . Sexual activity: Yes    Partners: Male  Other Topics Concern  . Not on file  Social History Narrative   5-6 hours of sleep per night   Lives with her husband who is retired Engineer, 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   Orig from [place]   in [place] since 1988   [name]    Social Determinants of Health   Financial Resource Strain:   . Difficulty of Paying  Living Expenses:   Food Insecurity:   . Worried About Programme researcher, broadcasting/film/video in the Last Year:   . Barista in the Last Year:   Transportation Needs:   . Freight forwarder (Medical):   Marland Kitchen Lack of Transportation (Non-Medical):   Physical Activity:   . Days of Exercise per Week:   . Minutes of Exercise per Session:   Stress:   . Feeling of Stress :   Social Connections:   . Frequency of Communication with Friends and Family:   . Frequency of Social Gatherings with Friends and Family:   . Attends Religious Services:   . Active Member of Clubs or Organizations:   . Attends Banker Meetings:   Marland Kitchen Marital Status:   Intimate Partner Violence:   . Fear of Current or Ex-Partner:   . Emotionally Abused:   Marland Kitchen Physically Abused:   . Sexually Abused:     Review of Systems  Respiratory: Negative for cough and shortness of breath.     Musculoskeletal: Negative for arthralgias.  All other systems reviewed and are negative.   Vitals:   01/17/20 1526  BP: (!) 140/80  Pulse: 72  Temp: 98.2 F (36.8 C)  SpO2: 97%     Physical Exam Constitutional:      Appearance: Normal appearance.  HENT:     Head: Normocephalic and atraumatic.     Nose: No congestion or rhinorrhea.  Eyes:     General:        Right eye: No discharge.        Left eye: No discharge.  Cardiovascular:     Rate and Rhythm: Normal rate and regular rhythm.     Heart sounds: No murmur heard.  No friction rub.  Pulmonary:     Effort: Pulmonary effort is normal. No respiratory distress.     Breath sounds: Normal breath sounds. No stridor. No wheezing or rhonchi.  Musculoskeletal:     Cervical back: No rigidity or tenderness.  Lymphadenopathy:     Cervical: No cervical adenopathy.  Neurological:     General: No focal deficit present.     Mental Status: She is alert.  Psychiatric:        Mood and Affect: Mood normal.    Data Reviewed: Previous CT from 2004 reviewed-film not available but report reviewed  Recent chest x-ray from 2019 reviewed  PFT from 2014 was within normal limit   Assessment:  Sarcoidosis -Changes of adenopathy with calcification mild fibrosis -Stable on several x-rays  Currently asymptomatic  Sarcoidosis affecting the skin  Plan/Recommendations: No testing is required at present  Follow-up in 1 year  Encouraged to call with any significant symptoms or concerns  May repeat chest x-ray prior to next visit in a year   Virl Diamond MD [facility] 01/17/2020, 3:53 PM  CC: Panosh, Neta Mends, MD

## 2020-01-17 NOTE — Patient Instructions (Signed)
Sarcoidosis -Completely asymptomatic at present  Humira for skin involvement -Chronic medication for many years as well  We will not make any changes at present  Call us with any concerns  Follow-up in a year We can get a chest x-ray on your visit a year from now

## 2020-02-07 ENCOUNTER — Other Ambulatory Visit: Payer: Self-pay | Admitting: Internal Medicine

## 2020-02-07 ENCOUNTER — Other Ambulatory Visit (INDEPENDENT_AMBULATORY_CARE_PROVIDER_SITE_OTHER): Payer: PPO

## 2020-02-07 ENCOUNTER — Other Ambulatory Visit: Payer: Self-pay

## 2020-02-07 DIAGNOSIS — E559 Vitamin D deficiency, unspecified: Secondary | ICD-10-CM | POA: Diagnosis not present

## 2020-02-08 LAB — HOUSE ACCOUNT TRACKING

## 2020-02-08 LAB — DUPLICATE REPORT

## 2020-02-08 LAB — VITAMIN D 25 HYDROXY (VIT D DEFICIENCY, FRACTURES): Vit D, 25-Hydroxy: 45 ng/mL (ref 30–100)

## 2020-02-11 ENCOUNTER — Other Ambulatory Visit: Payer: Self-pay

## 2020-02-11 ENCOUNTER — Ambulatory Visit (INDEPENDENT_AMBULATORY_CARE_PROVIDER_SITE_OTHER): Payer: PPO

## 2020-02-11 DIAGNOSIS — Z780 Asymptomatic menopausal state: Secondary | ICD-10-CM | POA: Diagnosis not present

## 2020-02-11 DIAGNOSIS — Z0000 Encounter for general adult medical examination without abnormal findings: Secondary | ICD-10-CM

## 2020-02-11 DIAGNOSIS — Z1231 Encounter for screening mammogram for malignant neoplasm of breast: Secondary | ICD-10-CM | POA: Diagnosis not present

## 2020-02-11 NOTE — Patient Instructions (Signed)
Courtney Bray , Thank you for taking time to come for your Medicare Wellness Visit. I appreciate your ongoing commitment to your health goals. Please review the following plan we discussed and let me know if I can assist you in the future.   Screening recommendations/referrals: Colonoscopy: Up to date, next cologuard due 06/17/2021 Mammogram: Currently due, orders placed this visit Bone Density: Currently due, orders placed this visit Recommended yearly ophthalmology/optometry visit for glaucoma screening and checkup Recommended yearly dental visit for hygiene and checkup  Vaccinations: Influenza vaccine: Up to date, due this fall 2021 Pneumococcal vaccine: Completed series  Tdap vaccine: Currently due, please contact your insurance to discuss cost or you may await and injury to receive as it will be covered by your insurance Shingles vaccine: Currently due, please contact your pharmacy to discuss cost and to receive.    Advanced directives: Please bring in a copy of your advanced directives so that we may scan them into your chart  Conditions/risks identified: none   Next appointment: 06/30/2020 at 9:00 am with Dr. Fabian Sharp   Preventive Care 65 Years and Older, Female Preventive care refers to lifestyle choices and visits with your health care provider that can promote health and wellness. What does preventive care include?  A yearly physical exam. This is also called an annual well check.  Dental exams once or twice a year.  Routine eye exams. Ask your health care provider how often you should have your eyes checked.  Personal lifestyle choices, including:  Daily care of your teeth and gums.  Regular physical activity.  Eating a healthy diet.  Avoiding tobacco and drug use.  Limiting alcohol use.  Practicing safe sex.  Taking low-dose aspirin every day.  Taking vitamin and mineral supplements as recommended by your health care provider. What happens during an annual  well check? The services and screenings done by your health care provider during your annual well check will depend on your age, overall health, lifestyle risk factors, and family history of disease. Counseling  Your health care provider may ask you questions about your:  Alcohol use.  Tobacco use.  Drug use.  Emotional well-being.  Home and relationship well-being.  Sexual activity.  Eating habits.  History of falls.  Memory and ability to understand (cognition).  Work and work Astronomer.  Reproductive health. Screening  You may have the following tests or measurements:  Height, weight, and BMI.  Blood pressure.  Lipid and cholesterol levels. These may be checked every 5 years, or more frequently if you are over 20 years old.  Skin check.  Lung cancer screening. You may have this screening every year starting at age 68 if you have a 30-pack-year history of smoking and currently smoke or have quit within the past 15 years.  Fecal occult blood test (FOBT) of the stool. You may have this test every year starting at age 32.  Flexible sigmoidoscopy or colonoscopy. You may have a sigmoidoscopy every 5 years or a colonoscopy every 10 years starting at age 29.  Hepatitis C blood test.  Hepatitis B blood test.  Sexually transmitted disease (STD) testing.  Diabetes screening. This is done by checking your blood sugar (glucose) after you have not eaten for a while (fasting). You may have this done every 1-3 years.  Bone density scan. This is done to screen for osteoporosis. You may have this done starting at age 57.  Mammogram. This may be done every 1-2 years. Talk to your health care provider  about how often you should have regular mammograms. Talk with your health care provider about your test results, treatment options, and if necessary, the need for more tests. Vaccines  Your health care provider may recommend certain vaccines, such as:  Influenza vaccine. This  is recommended every year.  Tetanus, diphtheria, and acellular pertussis (Tdap, Td) vaccine. You may need a Td booster every 10 years.  Zoster vaccine. You may need this after age 66.  Pneumococcal 13-valent conjugate (PCV13) vaccine. One dose is recommended after age 80.  Pneumococcal polysaccharide (PPSV23) vaccine. One dose is recommended after age 23. Talk to your health care provider about which screenings and vaccines you need and how often you need them. This information is not intended to replace advice given to you by your health care provider. Make sure you discuss any questions you have with your health care provider. Document Released: 07/04/2015 Document Revised: 02/25/2016 Document Reviewed: 04/08/2015 Elsevier Interactive Patient Education  2017 [employer] Prevention in the Home Falls can cause injuries. They can happen to people of all ages. There are many things you can do to make your home safe and to help prevent falls. What can I do on the outside of my home?  Regularly fix the edges of walkways and driveways and fix any cracks.  Remove anything that might make you trip as you walk through a door, such as a raised step or threshold.  Trim any bushes or trees on the path to your home.  Use bright outdoor lighting.  Clear any walking paths of anything that might make someone trip, such as rocks or tools.  Regularly check to see if handrails are loose or broken. Make sure that both sides of any steps have handrails.  Any raised decks and porches should have guardrails on the edges.  Have any leaves, snow, or ice cleared regularly.  Use sand or salt on walking paths during winter.  Clean up any spills in your garage right away. This includes oil or grease spills. What can I do in the bathroom?  Use night lights.  Install grab bars by the toilet and in the tub and shower. Do not use towel bars as grab bars.  Use non-skid mats or decals in the tub or  shower.  If you need to sit down in the shower, use a plastic, non-slip stool.  Keep the floor dry. Clean up any water that spills on the floor as soon as it happens.  Remove soap buildup in the tub or shower regularly.  Attach bath mats securely with double-sided non-slip rug tape.  Do not have throw rugs and other things on the floor that can make you trip. What can I do in the bedroom?  Use night lights.  Make sure that you have a light by your bed that is easy to reach.  Do not use any sheets or blankets that are too big for your bed. They should not hang down onto the floor.  Have a firm chair that has side arms. You can use this for support while you get dressed.  Do not have throw rugs and other things on the floor that can make you trip. What can I do in the kitchen?  Clean up any spills right away.  Avoid walking on wet floors.  Keep items that you use a lot in easy-to-reach places.  If you need to reach something above you, use a strong step stool that has a grab bar.  Keep electrical cords out of the way.  Do not use floor polish or wax that makes floors slippery. If you must use wax, use non-skid floor wax.  Do not have throw rugs and other things on the floor that can make you trip. What can I do with my stairs?  Do not leave any items on the stairs.  Make sure that there are handrails on both sides of the stairs and use them. Fix handrails that are broken or loose. Make sure that handrails are as long as the stairways.  Check any carpeting to make sure that it is firmly attached to the stairs. Fix any carpet that is loose or worn.  Avoid having throw rugs at the top or bottom of the stairs. If you do have throw rugs, attach them to the floor with carpet tape.  Make sure that you have a light switch at the top of the stairs and the bottom of the stairs. If you do not have them, ask someone to add them for you. What else can I do to help prevent  falls?  Wear shoes that:  Do not have high heels.  Have rubber bottoms.  Are comfortable and fit you well.  Are closed at the toe. Do not wear sandals.  If you use a stepladder:  Make sure that it is fully opened. Do not climb a closed stepladder.  Make sure that both sides of the stepladder are locked into place.  Ask someone to hold it for you, if possible.  Clearly mark and make sure that you can see:  Any grab bars or handrails.  First and last steps.  Where the edge of each step is.  Use tools that help you move around (mobility aids) if they are needed. These include:  Canes.  Walkers.  Scooters.  Crutches.  Turn on the lights when you go into a dark area. Replace any light bulbs as soon as they burn out.  Set up your furniture so you have a clear path. Avoid moving your furniture around.  If any of your floors are uneven, fix them.  If there are any pets around you, be aware of where they are.  Review your medicines with your doctor. Some medicines can make you feel dizzy. This can increase your chance of falling. Ask your doctor what other things that you can do to help prevent falls. This information is not intended to replace advice given to you by your health care provider. Make sure you discuss any questions you have with your health care provider. Document Released: 04/03/2009 Document Revised: 11/13/2015 Document Reviewed: 07/12/2014 Elsevier Interactive Patient Education  2017 [employer].

## 2020-02-11 NOTE — Progress Notes (Signed)
Subjective:   Courtney Bray is a 69 y.o. female who presents for Medicare Annual (Subsequent) preventive examination.  I connected with Gildardo PoundsBrenda Espina today by telephone and verified that I am speaking with the correct person using two identifiers. Location patient: home Location provider: work Persons participating in the virtual visit: patient, provider.   I discussed the limitations, risks, security and privacy concerns of performing an evaluation and management service by telephone and the availability of in person appointments. I also discussed with the patient that there may be a patient responsible charge related to this service. The patient expressed understanding and verbally consented to this telephonic visit.    Interactive audio and video telecommunications were attempted between this provider and patient, however failed, due to patient having technical difficulties OR patient did not have access to video capability.  We continued and completed visit with audio only.      Review of Systems    N/A Cardiac Risk Factors include: advanced age (>3155men, 17>65 women)     Objective:    Today's Vitals   There is no height or weight on file to calculate BMI.  Advanced Directives 02/11/2020 10/19/2017 06/08/2017 11/19/2015 07/31/2015  Does Patient Have a Medical Advance Directive? No;Yes Yes Yes No No  Type of Estate agentAdvance Directive Healthcare Power of [place];Living will - Healthcare Power of Attorney - -  Does patient want to make changes to medical advance directive? No - Patient declined - No - Patient declined - -  Copy of Healthcare Power of Attorney in Chart? No - copy requested - No - copy requested - -  Would patient like information on creating a medical advance directive? - - - Yes - Educational materials given Yes - Educational materials given    Current Medications (verified) Outpatient Encounter Medications as of 02/11/2020  Medication Sig  . clobetasol cream (TEMOVATE)  0.05 % Apply topically as needed.  Marland Kitchen. HUMIRA PEN 40 MG/0.8ML PNKT Inject every other week for sarcoidosis  . Vitamin D, Ergocalciferol, (DRISDOL) 1.25 MG (50000 UNIT) CAPS capsule Take 50,000 Units by mouth every 7 (seven) days. Takes daily   No facility-administered encounter medications on file as of 02/11/2020.    Allergies (verified) Amoxicillin and Vitamin d analogs   History: Past Medical History:  Diagnosis Date  . Chicken pox   . Lymphocytosis   . Osteoporosis   . 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  PFT's 2010:  No obstruction, TLC 3.93 (87%), DLCO 89%. PFT's 2014:  No obstruction, TLC 3.07, DLCO 91%     Past Surgical History:  Procedure Laterality Date  . TUBAL LIGATION  1979  . UTERINE ARTERY EMBOLIZATION  2000   Family History  Problem Relation Age of Onset  . Heart disease Father   . Stroke Father   . Dementia Father   . Dementia Mother   . High blood pressure Mother   . High blood pressure Sister        with hypertesnive renal disease   Social History   Socioeconomic History  . Marital status: Married    Spouse name: Not on file  . Number of children: Not on file  . Years of education: Not on file  . Highest education level: Not on file  Occupational History  . Not on file  Tobacco Use  . Smoking status: Never Smoker  . Smokeless tobacco:  Never Used  Vaping Use  . Vaping Use: Never used  Substance and Sexual Activity  . Alcohol use: Yes    Alcohol/week: 0.0 standard drinks    Comment: Occasional  . Drug use: No  . Sexual activity: Yes    Partners: Male  Other Topics Concern  . Not on file  Social History Narrative   5-6 hours of sleep per night   Lives with her husband who is retired Engineer, 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 Real estate self employed  Now retired ?   Takes care of her mother who  had dementia 3-4 hours daily   Has one dog in the home.   Orig from [place]   in [place] since 1988   [name]    Social Determinants of Health   Financial Resource Strain: Low Risk   . Difficulty of Paying Living Expenses: Not hard at all  Food Insecurity: No Food Insecurity  . Worried About Programme researcher, broadcasting/film/video in the Last Year: Never true  . Ran Out of Food in the Last Year: Never true  Transportation Needs: No Transportation Needs  . Lack of Transportation (Medical): No  . Lack of Transportation (Non-Medical): No  Physical Activity: Sufficiently Active  . Days of Exercise per Week: 5 days  . Minutes of Exercise per Session: 50 min  Stress: No Stress Concern Present  . Feeling of Stress : Not at all  Social Connections: Moderately Integrated  . Frequency of Communication with Friends and Family: More than three times a week  . Frequency of Social Gatherings with Friends and Family: Once a week  . Attends Religious Services: More than 4 times per year  . Active Member of Clubs or Organizations: No  . Attends Banker Meetings: Never  . Marital Status: Married    Tobacco Counseling Counseling given: Not Answered   Clinical Intake:  Pre-visit preparation completed: Yes  Pain : No/denies pain     Nutritional Risks: None Diabetes: No  How often do you need to have someone help you when you read instructions, pamphlets, or other written materials from your doctor or pharmacy?: 1 - Never What is the last grade level you completed in school?: Some College  Diabetic?No  Interpreter Needed?: No  Information entered by :: [employer]. LPN   Activities of Daily Living In your present state of health, do you have any difficulty performing the following activities: 02/11/2020  Hearing? N  Vision? N  Difficulty concentrating or making decisions? N  Walking or climbing stairs? N  Dressing or bathing? N  Doing errands, shopping? N  Preparing Food and eating ? N    Using the Toilet? N  In the past six months, have you accidently leaked urine? Y  Comment has had some bladder leakage  Do you have problems with loss of bowel control? Y  Comment Has had some occassional loss of bowel control  Managing your Medications? N  Managing your Finances? N  Housekeeping or managing your Housekeeping? N  Some recent data might be hidden    Patient Care Team: Panosh, Neta Mends, MD as PCP - General (Internal Medicine) Renaldo Reel Georgeanna Harrison, MD as Referring Physician (Dermatology) Michele Mcalpine, MD as Consulting Physician (Pulmonary Disease)  Indicate any recent Medical Services you may have received from other than [facility] providers in the past year (date may be approximate).  Assessment:   This is a routine wellness examination for Trinika.  Hearing/Vision screen  Hearing Screening   125Hz  250Hz  500Hz  1000Hz  2000Hz  3000Hz  4000Hz  6000Hz  8000Hz   Right ear:           Left ear:           Vision Screening Comments: Patient states gets annual eye exams   Dietary issues and exercise activities discussed: Current Exercise Habits: Home exercise routine, Type of exercise: walking, Time (Minutes): 45, Frequency (Times/Week): 5, Weekly Exercise (Minutes/Week): 225, Intensity: Mild  Goals    . Exercise 150 min/wk Moderate Activity     Will start walking more Plan will be start walking after spouse picks up your mother   Maybe try the silver sneakers- try and see     . Patient Stated     I would like to add some strength training to my exercise plan      Depression Screen PHQ 2/9 Scores 02/11/2020 06/29/2019 05/30/2018 10/19/2017 06/08/2017 04/08/2017 04/02/2016  PHQ - 2 Score 0 0 0 0 0 0 0  PHQ- 9 Score 0 - - - - - -    Fall Risk Fall Risk  02/11/2020 06/29/2019 05/30/2018 10/19/2017 06/08/2017  Falls in the past year? 0 0 0 No No  Number falls in past yr: 0 0 - - -  Injury with Fall? 0 0 - - -  Follow up Falls evaluation completed;Falls prevention discussed  Falls evaluation completed - - -    Any stairs in or around the home? Yes  If so, are there any without handrails? No  Home free of loose throw rugs in walkways, pet beds, electrical cords, etc? Yes  Adequate lighting in your home to reduce risk of falls? Yes   ASSISTIVE DEVICES UTILIZED TO PREVENT FALLS:  Life alert? No  Use of a cane, walker or w/c? No  Grab bars in the bathroom? No  Shower chair or bench in shower? No  Elevated toilet seat or a handicapped toilet? No     Cognitive Function: Cognitive screening not indicated based on direct observation  MMSE - Mini Mental State Exam 10/19/2017  Not completed: (No Data)        Immunizations Immunization History  Administered Date(s) Administered  . Fluad Quad(high Dose 65+) 04/05/2019  . Influenza, High Dose Seasonal PF 05/30/2018  . PFIZER SARS-COV-2 Vaccination 08/04/2019, 08/29/2019  . PPD Test 05/11/2013, 04/01/2014, 04/01/2015, 05/29/2015, 04/08/2017, 05/02/2017  . Pneumococcal Conjugate-13 04/02/2016  . Pneumococcal Polysaccharide-23 09/05/2012, 10/19/2017  . Tdap 02/20/2007  . Zoster 04/22/2011    TDAP status: Due, Education has been provided regarding the importance of this vaccine. Advised may receive this vaccine at local pharmacy or Health Dept. Aware to provide a copy of the vaccination record if obtained from local pharmacy or Health Dept. Verbalized acceptance and understanding. Flu Vaccine status: Up to date Pneumococcal vaccine status: Up to date Covid-19 vaccine status: Completed vaccines  Qualifies for Shingles Vaccine? Yes   Zostavax completed Yes   Shingrix Completed?: No.    Education has been provided regarding the importance of this vaccine. Patient has been advised to call insurance company to determine out of pocket expense if they have not yet received this vaccine. Advised may also receive vaccine at local pharmacy or Health Dept. Verbalized acceptance and understanding.  Screening  Tests Health Maintenance  Topic Date Due  . Hepatitis C Screening  Never done  . TETANUS/TDAP  02/19/2017  . MAMMOGRAM  04/08/2018  . COLONOSCOPY  02/15/2019  . INFLUENZA VACCINE  01/20/2020  . DEXA SCAN  Completed  . COVID-19 Vaccine  Completed  . PNA vac Low Risk Adult  Completed    Health Maintenance  Health Maintenance Due  Topic Date Due  . Hepatitis C Screening  Never done  . TETANUS/TDAP  02/19/2017  . MAMMOGRAM  04/08/2018  . COLONOSCOPY  02/15/2019  . INFLUENZA VACCINE  01/20/2020    Colorectal cancer screening: Completed cologuard 06/17/2018. Repeat every 3 years Mammogram status: Ordered 02/11/2020. Pt provided with contact info and advised to call to schedule appt.  Bone Density status: Ordered 02/11/2020. Pt provided with contact info and advised to call to schedule appt.  Lung Cancer Screening: (Low Dose CT Chest recommended if Age 55-80 years, 30 pack-year currently smoking OR have quit w/in 15years.) does not qualify.   Lung Cancer Screening Referral: N/A  Additional Screening:  Hepatitis C Screening: does qualify;   Vision Screening: Recommended annual ophthalmology exams for early detection of glaucoma and other disorders of the eye. Is the patient up to date with their annual eye exam?  Yes  Who is the provider or what is the name of the office in which the patient attends annual eye exams? Dr. Corky Mull If pt is not established with a provider, would they like to be referred to a provider to establish care? No .   Dental Screening: Recommended annual dental exams for proper oral hygiene  Community Resource Referral / Chronic Care Management: CRR required this visit?  No   CCM required this visit?  No      Plan:     I have personally reviewed and noted the following in the patient's chart:   . Medical and social history . Use of alcohol, tobacco or illicit drugs  . Current medications and supplements . Functional ability and  status . Nutritional status . Physical activity . Advanced directives . List of other physicians . Hospitalizations, surgeries, and ER visits in previous 12 months . Vitals . Screenings to include cognitive, depression, and falls . Referrals and appointments  In addition, I have reviewed and discussed with patient certain preventive protocols, quality metrics, and best practice recommendations. A written personalized care plan for preventive services as well as general preventive health recommendations were provided to patient.     Theodora Blow, LPN   [id number]   Nurse Notes: none

## 2020-02-21 ENCOUNTER — Other Ambulatory Visit: Payer: Self-pay | Admitting: Internal Medicine

## 2020-02-21 DIAGNOSIS — M810 Age-related osteoporosis without current pathological fracture: Secondary | ICD-10-CM

## 2020-02-27 ENCOUNTER — Other Ambulatory Visit: Payer: Self-pay | Admitting: Internal Medicine

## 2020-02-27 DIAGNOSIS — Z1231 Encounter for screening mammogram for malignant neoplasm of breast: Secondary | ICD-10-CM

## 2020-05-22 DIAGNOSIS — Z5181 Encounter for therapeutic drug level monitoring: Secondary | ICD-10-CM | POA: Diagnosis not present

## 2020-05-22 DIAGNOSIS — D869 Sarcoidosis, unspecified: Secondary | ICD-10-CM | POA: Diagnosis not present

## 2020-05-22 DIAGNOSIS — Z79899 Other long term (current) drug therapy: Secondary | ICD-10-CM | POA: Diagnosis not present

## 2020-06-09 ENCOUNTER — Ambulatory Visit
Admission: RE | Admit: 2020-06-09 | Discharge: 2020-06-09 | Disposition: A | Discharge status: Home / Self Care | Payer: PPO | Source: Ambulatory Visit | Attending: Internal Medicine | Admitting: Internal Medicine

## 2020-06-09 ENCOUNTER — Other Ambulatory Visit: Payer: Self-pay

## 2020-06-09 DIAGNOSIS — M810 Age-related osteoporosis without current pathological fracture: Secondary | ICD-10-CM

## 2020-06-09 DIAGNOSIS — Z1231 Encounter for screening mammogram for malignant neoplasm of breast: Secondary | ICD-10-CM | POA: Diagnosis not present

## 2020-06-09 DIAGNOSIS — Z780 Asymptomatic menopausal state: Secondary | ICD-10-CM | POA: Diagnosis not present

## 2020-06-27 ENCOUNTER — Other Ambulatory Visit: Payer: Self-pay

## 2020-06-27 NOTE — Progress Notes (Signed)
Chief Complaint  Patient presents with  . Annual Exam    HPI: Courtney Bray 70 y.o. comes in today for Preventive Medicare exam visit .Since last visit. Generally she is doing well. Sarcoidosis: Sees pulmonary once a year stable. Bone health she has been doing exercise adding calcium.  Negative family history on first questioning no history of fracture.  Reluctant to take extra medicine unless compelling. Colon cancer screening did have a Cologuard 12 2019 negative   Health Maintenance  Topic Date Due  . COLONOSCOPY (Pts 45-3yrs Insurance coverage will need to be confirmed)  02/15/2019  . INFLUENZA VACCINE  09/18/2020 (Originally 01/20/2020)  . TETANUS/TDAP  06/30/2021 (Originally 02/19/2017)  . Hepatitis C Screening  06/30/2021 (Originally 1950-07-20)  . MAMMOGRAM  06/09/2022  . DEXA SCAN  Completed  . COVID-19 Vaccine  Completed  . PNA vac Low Risk Adult  Completed   Health Maintenance Review LIFESTYLE:  Exercise:   Walks q day  1-2 miles  Tobacco/ETS:n Alcohol:  Some af few per week  Sugar beverages: Sleep: good sleep  Drug use: no HH:  2  Dog      Hearing: ok  Vision:  No limitations at present . Last eye check UTD  Safety:  Has smoke detector and wears seat belts.  No firearms. No excess sun exposure. Sees dentist regularly.  Falls: n  Memory: Felt to be good  , no concern from her or her family.  Depression: No anhedonia unusual crying or depressive symptoms  Nutrition: Eats well balanced diet; adequate calcium and vitamin D. No swallowing chewing problems.  Injury: no major injuries in the last six months.  Other healthcare providers:  Reviewed today .  Preventive parameters: up-to-date  Reviewed   ADLS:   There are no problems or need for assistance  driving, feeding, obtaining food, dressing, toileting and bathing, managing money using phone. She is independent.h     ROS:  GEN/ HEENT: No fever, significant weight changes sweats headaches  vision problems hearing changes, CV/ PULM; No chest pain shortness of breath cough, syncope,edema  change in exercise tolerance. GI /GU: No adominal pain, vomiting, change in bowel habits. No blood in the stool. No significant GU symptoms. SKIN/HEME: ,no acute skin rashes suspicious lesions or bleeding. No lymphadenopathy, nodules, masses.  NEURO/ PSYCH:  No neurologic signs such as weakness numbness. No depression anxiety. IMM/ Allergy: No unusual infections.  Allergy .   REST of 12 system review negative except as per HPI   Past Medical History:  Diagnosis Date  . Chicken pox   . Lymphocytosis   . Osteoporosis   . 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  PFT's 2010:  No obstruction, TLC 3.93 (87%), DLCO 89%. PFT's 2014:  No obstruction, TLC 3.07, DLCO 91%      Family History  Problem Relation Age of Onset  . Heart disease Father   . Stroke Father   . Dementia Father   . Dementia Mother   . High blood pressure Mother   . High blood pressure Sister        with hypertesnive renal disease    Social History   Socioeconomic History  . Marital status: Married    Spouse name: Not on file  . Number of children: Not on file  . Years of education: Not on file  . Highest education level: Not on  file  Occupational History  . Not on file  Tobacco Use  . Smoking status: Never Smoker  . Smokeless tobacco: Never Used  Vaping Use  . Vaping Use: Never used  Substance and Sexual Activity  . Alcohol use: Yes    Alcohol/week: 0.0 standard drinks    Comment: Occasional  . Drug use: No  . Sexual activity: Yes    Partners: Male  Other Topics Concern  . Not on file  Social History Narrative   5-6 hours of sleep per night   Lives with her husband who is retired Engineer, 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 Real estate self employed  Now retired  ?   Takes care of her mother who had dementia 3-4 hours daily   Has one dog in the home.   Orig from [place]   in [place] since 1988   [name]    Social Determinants of Health   Financial Resource Strain: Low Risk   . Difficulty of Paying Living Expenses: Not hard at all  Food Insecurity: No Food Insecurity  . Worried About Programme researcher, broadcasting/film/video in the Last Year: Never true  . Ran Out of Food in the Last Year: Never true  Transportation Needs: No Transportation Needs  . Lack of Transportation (Medical): No  . Lack of Transportation (Non-Medical): No  Physical Activity: Sufficiently Active  . Days of Exercise per Week: 5 days  . Minutes of Exercise per Session: 50 min  Stress: No Stress Concern Present  . Feeling of Stress : Not at all  Social Connections: Moderately Integrated  . Frequency of Communication with Friends and Family: More than three times a week  . Frequency of Social Gatherings with Friends and Family: Once a week  . Attends Religious Services: More than 4 times per year  . Active Member of Clubs or Organizations: No  . Attends Banker Meetings: Never  . Marital Status: Married    Outpatient Encounter Medications as of 06/30/2020  Medication Sig  . clobetasol cream (TEMOVATE) 0.05 % Apply topically as needed.  Marland Kitchen HUMIRA PEN 40 MG/0.8ML PNKT Inject every other week for sarcoidosis  . Multiple Vitamin (MULTIVITAMIN) tablet Take 1 tablet by mouth daily.  Marland Kitchen VITAMIN D PO Take 5,000 Units by mouth daily.  . [DISCONTINUED] Vitamin D, Ergocalciferol, (DRISDOL) 1.25 MG (50000 UNIT) CAPS capsule Take 50,000 Units by mouth every 7 (seven) days. Takes daily   No facility-administered encounter medications on file as of 06/30/2020.    EXAM:  BP 128/90 (BP Location: Left Arm, Patient Position: Sitting, Cuff Size: Normal)   Pulse 77   Temp 98 F (36.7 C) (Oral)   Ht 5' 1.5" (1.562 m)   Wt 119 lb (54 kg)   SpO2 99%   BMI 22.12 kg/m   Body mass index is 22.12  kg/m. Reports blood pressure at home usually lower than current diastolic in the 80s Physical Exam: Vital signs reviewed XLK:GMWN is a well-developed well-nourished alert cooperative   who appears stated age in no acute distress.  HEENT: normocephalic atraumatic , Eyes: PERRL EOM's full, conjunctiva clear, Nares: paten,t no deformity discharge or tenderness., Ears: no deformity EAC's clear TMs with normal landmarks. Mouth masked NECK: supple without masses, thyromegaly or bruits. CHEST/PULM:  Clear to auscultation and percussion breath sounds equal no wheeze , rales or rhonchi. No chest wall deformities or tenderness. Breast no nodules or  discharge nipple inversion old. CV: PMI is nondisplaced, S1 S2 no gallops, murmurs, rubs. Peripheral pulses are full without delay.No JVD .  ABDOMEN: Bowel sounds normal nontender  No guard or rebound, no hepato splenomegal no CVA tenderness.   Extremtities:  No clubbing cyanosis or edema, no acute joint swelling or redness no focal atrophy NEURO:  Oriented x3, cranial nerves 3-12 appear to be intact, no obvious focal weakness,gait within normal limits no abnormal reflexes or asymmetrical SKIN: No acute rashes normal turgor, color, no bruising or petechiae. PSYCH: Oriented, good eye contact, no obvious depression anxiety, cognition and judgment appear normal. LN: no cervical axillary inguinal adenopathy No noted deficits in memory, attention, and speech.   Lab Results  Component Value Date   WBC 4.0 06/26/2019   HGB 14.6 06/26/2019   HCT 43.5 06/26/2019   PLT 269.0 06/26/2019   GLUCOSE 90 06/26/2019   CHOL 206 (H) 06/26/2019   TRIG 63.0 06/26/2019   HDL 66.70 06/26/2019   LDLCALC 127 (H) 06/26/2019   ALT 20 06/26/2019   AST 19 06/26/2019   NA 139 06/26/2019   K 3.7 06/26/2019   CL 103 06/26/2019   CREATININE 0.70 06/26/2019   BUN 11 06/26/2019   CO2 28 06/26/2019   TSH 1.53 03/26/2016    ASSESSMENT AND PLAN:  Discussed the following  assessment and plan:  Visit for preventive health examination  Sarcoidosis - Plan: Basic metabolic panel, CBC with Differential/Platelet, Hepatic function panel, Lipid panel, TSH, Parathyroid hormone, intact (no Ca), Calcium, Parathyroid hormone, intact (no Ca), Calcium, TSH, Lipid panel, Hepatic function panel, CBC with Differential/Platelet, Basic metabolic panel, CANCELED: CBC with Differential/Platelet, CANCELED: BASIC METABOLIC PANEL WITH GFR, CANCELED: Lipid panel, CANCELED: Hepatic function panel, CANCELED: TSH, CANCELED: PTH, intact and calcium  High risk medication use - Plan: Basic metabolic panel, CBC with Differential/Platelet, Hepatic function panel, Lipid panel, TSH, Parathyroid hormone, intact (no Ca), Calcium, Parathyroid hormone, intact (no Ca), Calcium, TSH, Lipid panel, Hepatic function panel, CBC with Differential/Platelet, Basic metabolic panel, CANCELED: CBC with Differential/Platelet, CANCELED: BASIC METABOLIC PANEL WITH GFR, CANCELED: Lipid panel, CANCELED: Hepatic function panel, CANCELED: TSH, CANCELED: PTH, intact and calcium  Osteoporosis without current pathological fracture, unspecified osteoporosis type - Plan: Basic metabolic panel, CBC with Differential/Platelet, Hepatic function panel, Lipid panel, TSH, Parathyroid hormone, intact (no Ca), Calcium, Parathyroid hormone, intact (no Ca), Calcium, TSH, Lipid panel, Hepatic function panel, CBC with Differential/Platelet, Basic metabolic panel, Ambulatory referral to Endocrinology, CANCELED: CBC with Differential/Platelet, CANCELED: BASIC METABOLIC PANEL WITH GFR, CANCELED: Lipid panel, CANCELED: Hepatic function panel, CANCELED: TSH, CANCELED: PTH, intact and calcium  Elevated LDL cholesterol level - Plan: Basic metabolic panel, CBC with Differential/Platelet, Hepatic function panel, Lipid panel, TSH, Parathyroid hormone, intact (no Ca), Calcium, Parathyroid hormone, intact (no Ca), Calcium, TSH, Lipid panel, Hepatic function  panel, CBC with Differential/Platelet, Basic metabolic panel, CANCELED: CBC with Differential/Platelet, CANCELED: BASIC METABOLIC PANEL WITH GFR, CANCELED: Lipid panel, CANCELED: Hepatic function panel, CANCELED: TSH, CANCELED: PTH, intact and calcium Discussed his her osteoporosis stable but low T-scores. She is interested in getting a consult and discussing options and risk benefit of medication interventions with the specialist will refer to endocrinology. Labs today. She can check her blood pressure readings at home doing ensure at goal. Patient Care Team: Jamell Opfer, Neta Mends, MD as PCP - General (Internal Medicine) Renaldo Reel Georgeanna Harrison, MD as Referring Physician (Dermatology) Tomma Lightning, MD as Consulting Physician (Pulmonary Disease)  Patient Instructions   Glad you are doing well .  Continue lifestyle intervention healthy eating and exercise .   Shingrix  vaccine when convenient .  Lab today  Will be notified about  Referral to discuss  The osteoporosis   Colon screen due next year .    Health Maintenance, Female Adopting a healthy lifestyle and getting preventive care are important in promoting health and wellness. Ask your health care provider about:  The right schedule for you to have regular tests and exams.  Things you can do on your own to prevent diseases and keep yourself healthy. What should I know about diet, weight, and exercise? Eat a healthy diet  Eat a diet that includes plenty of vegetables, fruits, low-fat dairy products, and lean protein.  Do not eat a lot of foods that are high in solid fats, added sugars, or sodium.   Maintain a healthy weight Body mass index (BMI) is used to identify weight problems. It estimates body fat based on height and weight. Your health care provider can help determine your BMI and help you achieve or maintain a healthy weight. Get regular exercise Get regular exercise. This is one of the most important things you can do  for your health. Most adults should:  Exercise for at least 150 minutes each week. The exercise should increase your heart rate and make you sweat (moderate-intensity exercise).  Do strengthening exercises at least twice a week. This is in addition to the moderate-intensity exercise.  Spend less time sitting. Even light physical activity can be beneficial. Watch cholesterol and blood lipids Have your blood tested for lipids and cholesterol at 70 years of age, then have this test every 5 years. Have your cholesterol levels checked more often if:  Your lipid or cholesterol levels are high.  You are older than 70 years of age.  You are at high risk for heart disease. What should I know about cancer screening? Depending on your health history and family history, you may need to have cancer screening at various ages. This may include screening for:  Breast cancer.  Cervical cancer.  Colorectal cancer.  Skin cancer.  Lung cancer. What should I know about heart disease, diabetes, and high blood pressure? Blood pressure and heart disease  High blood pressure causes heart disease and increases the risk of stroke. This is more likely to develop in people who have high blood pressure readings, are of African descent, or are overweight.  Have your blood pressure checked: ? Every 3-5 years if you are 40-70 years of age. ? Every year if you are 40 years old or older. Diabetes Have regular diabetes screenings. This checks your fasting blood sugar level. Have the screening done:  Once every three years after age 33 if you are at a normal weight and have a low risk for diabetes.  More often and at a younger age if you are overweight or have a high risk for diabetes. What should I know about preventing infection? Hepatitis B If you have a higher risk for hepatitis B, you should be screened for this virus. Talk with your health care provider to find out if you are at risk for hepatitis B  infection. Hepatitis C Testing is recommended for:  Everyone born from [birth year] through 1965.  Anyone with known risk factors for hepatitis C. Sexually transmitted infections (STIs)  Get screened for STIs, including gonorrhea and chlamydia, if: ? You are sexually active and are younger than 69 years of age. ? You are older than 70 years of age  and your health care provider tells you that you are at risk for this type of infection. ? Your sexual activity has changed since you were last screened, and you are at increased risk for chlamydia or gonorrhea. Ask your health care provider if you are at risk.  Ask your health care provider about whether you are at high risk for HIV. Your health care provider may recommend a prescription medicine to help prevent HIV infection. If you choose to take medicine to prevent HIV, you should first get tested for HIV. You should then be tested every 3 months for as long as you are taking the medicine. Pregnancy  If you are about to stop having your period (premenopausal) and you may become pregnant, seek counseling before you get pregnant.  Take 400 to 800 micrograms (mcg) of folic acid every day if you become pregnant.  Ask for birth control (contraception) if you want to prevent pregnancy. Osteoporosis and menopause Osteoporosis is a disease in which the bones lose minerals and strength with aging. This can result in bone fractures. If you are 52 years old or older, or if you are at risk for osteoporosis and fractures, ask your health care provider if you should:  Be screened for bone loss.  Take a calcium or vitamin D supplement to lower your risk of fractures.  Be given hormone replacement therapy (HRT) to treat symptoms of menopause. Follow these instructions at home: Lifestyle  Do not use any products that contain nicotine or tobacco, such as cigarettes, e-cigarettes, and chewing tobacco. If you need help quitting, ask your health care  provider.  Do not use street drugs.  Do not share needles.  Ask your health care provider for help if you need support or information about quitting drugs. Alcohol use  Do not drink alcohol if: ? Your health care provider tells you not to drink. ? You are pregnant, may be pregnant, or are planning to become pregnant.  If you drink alcohol: ? Limit how much you use to 0-1 drink a day. ? Limit intake if you are breastfeeding.  Be aware of how much alcohol is in your drink. In the [place] one drink equals one 12 oz bottle of beer (355 mL), one 5 oz glass of wine (148 mL), or one 1 oz glass of hard liquor (44 mL). General instructions  Schedule regular health, dental, and eye exams.  Stay current with your vaccines.  Tell your health care provider if: ? You often feel depressed. ? You have ever been abused or do not feel safe at home. Summary  Adopting a healthy lifestyle and getting preventive care are important in promoting health and wellness.  Follow your health care provider's instructions about healthy diet, exercising, and getting tested or screened for diseases.  Follow your health care provider's instructions on monitoring your cholesterol and blood pressure. This information is not intended to replace advice given to you by your health care provider. Make sure you discuss any questions you have with your health care provider. Document Revised: 05/31/2018 Document Reviewed: 05/31/2018 Elsevier Patient Education  2021 [employer].        [place]. Madiline Saffran M.D.

## 2020-06-30 ENCOUNTER — Ambulatory Visit (INDEPENDENT_AMBULATORY_CARE_PROVIDER_SITE_OTHER): Payer: Medicare HMO | Admitting: Internal Medicine

## 2020-06-30 ENCOUNTER — Other Ambulatory Visit: Payer: Self-pay

## 2020-06-30 ENCOUNTER — Encounter: Payer: Self-pay | Admitting: Internal Medicine

## 2020-06-30 VITALS — BP 128/90 | HR 77 | Temp 98.0°F | Ht 61.5 in | Wt 119.0 lb

## 2020-06-30 DIAGNOSIS — M810 Age-related osteoporosis without current pathological fracture: Secondary | ICD-10-CM

## 2020-06-30 DIAGNOSIS — R7989 Other specified abnormal findings of blood chemistry: Secondary | ICD-10-CM

## 2020-06-30 DIAGNOSIS — Z0000 Encounter for general adult medical examination without abnormal findings: Secondary | ICD-10-CM

## 2020-06-30 DIAGNOSIS — E7800 Pure hypercholesterolemia, unspecified: Secondary | ICD-10-CM

## 2020-06-30 DIAGNOSIS — D869 Sarcoidosis, unspecified: Secondary | ICD-10-CM | POA: Diagnosis not present

## 2020-06-30 DIAGNOSIS — Z79899 Other long term (current) drug therapy: Secondary | ICD-10-CM | POA: Diagnosis not present

## 2020-06-30 DIAGNOSIS — R945 Abnormal results of liver function studies: Secondary | ICD-10-CM | POA: Diagnosis not present

## 2020-06-30 LAB — HEPATIC FUNCTION PANEL
ALT: 36 U/L — ABNORMAL HIGH (ref 0–35)
AST: 28 U/L (ref 0–37)
Albumin: 4.2 g/dL (ref 3.5–5.2)
Alkaline Phosphatase: 83 U/L (ref 39–117)
Bilirubin, Direct: 0.1 mg/dL (ref 0.0–0.3)
Total Bilirubin: 0.5 mg/dL (ref 0.2–1.2)
Total Protein: 7.4 g/dL (ref 6.0–8.3)

## 2020-06-30 LAB — CBC WITH DIFFERENTIAL/PLATELET
Basophils Absolute: 0.1 10*3/uL (ref 0.0–0.1)
Basophils Relative: 1.3 % (ref 0.0–3.0)
Eosinophils Absolute: 0.2 10*3/uL (ref 0.0–0.7)
Eosinophils Relative: 5.8 % — ABNORMAL HIGH (ref 0.0–5.0)
HCT: 42.9 % (ref 36.0–46.0)
Hemoglobin: 14.7 g/dL (ref 12.0–15.0)
Lymphocytes Relative: 62.5 % — ABNORMAL HIGH (ref 12.0–46.0)
Lymphs Abs: 2.6 10*3/uL (ref 0.7–4.0)
MCHC: 34.2 g/dL (ref 30.0–36.0)
MCV: 94.2 fl (ref 78.0–100.0)
Monocytes Absolute: 0.3 10*3/uL (ref 0.1–1.0)
Monocytes Relative: 8.4 % (ref 3.0–12.0)
Neutro Abs: 0.9 10*3/uL — ABNORMAL LOW (ref 1.4–7.7)
Neutrophils Relative %: 22 % — ABNORMAL LOW (ref 43.0–77.0)
Platelets: 290 10*3/uL (ref 150.0–400.0)
RBC: 4.55 Mil/uL (ref 3.87–5.11)
RDW: 13.2 % (ref 11.5–15.5)
WBC: 4.1 10*3/uL (ref 4.0–10.5)

## 2020-06-30 LAB — BASIC METABOLIC PANEL
BUN: 15 mg/dL (ref 6–23)
CO2: 31 mEq/L (ref 19–32)
Calcium: 9.4 mg/dL (ref 8.4–10.5)
Chloride: 104 mEq/L (ref 96–112)
Creatinine, Ser: 0.69 mg/dL (ref 0.40–1.20)
GFR: 88.59 mL/min (ref >60.00)
Glucose, Bld: 85 mg/dL (ref 70–99)
Potassium: 4.4 mEq/L (ref 3.5–5.1)
Sodium: 139 mEq/L (ref 135–145)

## 2020-06-30 LAB — LIPID PANEL
Cholesterol: 203 mg/dL — ABNORMAL HIGH (ref 0–200)
HDL: 67.2 mg/dL (ref >39.00)
LDL Cholesterol: 121 mg/dL — ABNORMAL HIGH (ref 0–99)
NonHDL: 135.8
Total CHOL/HDL Ratio: 3
Triglycerides: 72 mg/dL (ref 0.0–149.0)
VLDL: 14.4 mg/dL (ref 0.0–40.0)

## 2020-06-30 LAB — TSH: TSH: 1.37 u[IU]/mL (ref 0.35–4.50)

## 2020-06-30 NOTE — Patient Instructions (Addendum)
Glad you are doing well .  Continue lifestyle intervention healthy eating and exercise .   Shingrix  vaccine when convenient .  Lab today  Will be notified about  Referral to discuss  The osteoporosis   Colon screen due next year .    Health Maintenance, Female Adopting a healthy lifestyle and getting preventive care are important in promoting health and wellness. Ask your health care provider about:  The right schedule for you to have regular tests and exams.  Things you can do on your own to prevent diseases and keep yourself healthy. What should I know about diet, weight, and exercise? Eat a healthy diet  Eat a diet that includes plenty of vegetables, fruits, low-fat dairy products, and lean protein.  Do not eat a lot of foods that are high in solid fats, added sugars, or sodium.   Maintain a healthy weight Body mass index (BMI) is used to identify weight problems. It estimates body fat based on height and weight. Your health care provider can help determine your BMI and help you achieve or maintain a healthy weight. Get regular exercise Get regular exercise. This is one of the most important things you can do for your health. Most adults should:  Exercise for at least 150 minutes each week. The exercise should increase your heart rate and make you sweat (moderate-intensity exercise).  Do strengthening exercises at least twice a week. This is in addition to the moderate-intensity exercise.  Spend less time sitting. Even light physical activity can be beneficial. Watch cholesterol and blood lipids Have your blood tested for lipids and cholesterol at 70 years of age, then have this test every 5 years. Have your cholesterol levels checked more often if:  Your lipid or cholesterol levels are high.  You are older than 70 years of age.  You are at high risk for heart disease. What should I know about cancer screening? Depending on your health history and family history, you may  need to have cancer screening at various ages. This may include screening for:  Breast cancer.  Cervical cancer.  Colorectal cancer.  Skin cancer.  Lung cancer. What should I know about heart disease, diabetes, and high blood pressure? Blood pressure and heart disease  High blood pressure causes heart disease and increases the risk of stroke. This is more likely to develop in people who have high blood pressure readings, are of African descent, or are overweight.  Have your blood pressure checked: ? Every 3-5 years if you are 40-47 years of age. ? Every year if you are 58 years old or older. Diabetes Have regular diabetes screenings. This checks your fasting blood sugar level. Have the screening done:  Once every three years after age 21 if you are at a normal weight and have a low risk for diabetes.  More often and at a younger age if you are overweight or have a high risk for diabetes. What should I know about preventing infection? Hepatitis B If you have a higher risk for hepatitis B, you should be screened for this virus. Talk with your health care provider to find out if you are at risk for hepatitis B infection. Hepatitis C Testing is recommended for:  Everyone born from [birth year] through 1965.  Anyone with known risk factors for hepatitis C. Sexually transmitted infections (STIs)  Get screened for STIs, including gonorrhea and chlamydia, if: ? You are sexually active and are younger than 70 years of age. ? You are  older than 70 years of age and your health care provider tells you that you are at risk for this type of infection. ? Your sexual activity has changed since you were last screened, and you are at increased risk for chlamydia or gonorrhea. Ask your health care provider if you are at risk.  Ask your health care provider about whether you are at high risk for HIV. Your health care provider may recommend a prescription medicine to help prevent HIV infection. If you  choose to take medicine to prevent HIV, you should first get tested for HIV. You should then be tested every 3 months for as long as you are taking the medicine. Pregnancy  If you are about to stop having your period (premenopausal) and you may become pregnant, seek counseling before you get pregnant.  Take 400 to 800 micrograms (mcg) of folic acid every day if you become pregnant.  Ask for birth control (contraception) if you want to prevent pregnancy. Osteoporosis and menopause Osteoporosis is a disease in which the bones lose minerals and strength with aging. This can result in bone fractures. If you are 67 years old or older, or if you are at risk for osteoporosis and fractures, ask your health care provider if you should:  Be screened for bone loss.  Take a calcium or vitamin D supplement to lower your risk of fractures.  Be given hormone replacement therapy (HRT) to treat symptoms of menopause. Follow these instructions at home: Lifestyle  Do not use any products that contain nicotine or tobacco, such as cigarettes, e-cigarettes, and chewing tobacco. If you need help quitting, ask your health care provider.  Do not use street drugs.  Do not share needles.  Ask your health care provider for help if you need support or information about quitting drugs. Alcohol use  Do not drink alcohol if: ? Your health care provider tells you not to drink. ? You are pregnant, may be pregnant, or are planning to become pregnant.  If you drink alcohol: ? Limit how much you use to 0-1 drink a day. ? Limit intake if you are breastfeeding.  Be aware of how much alcohol is in your drink. In the [place] one drink equals one 12 oz bottle of beer (355 mL), one 5 oz glass of wine (148 mL), or one 1 oz glass of hard liquor (44 mL). General instructions  Schedule regular health, dental, and eye exams.  Stay current with your vaccines.  Tell your health care provider if: ? You often feel  depressed. ? You have ever been abused or do not feel safe at home. Summary  Adopting a healthy lifestyle and getting preventive care are important in promoting health and wellness.  Follow your health care provider's instructions about healthy diet, exercising, and getting tested or screened for diseases.  Follow your health care provider's instructions on monitoring your cholesterol and blood pressure. This information is not intended to replace advice given to you by your health care provider. Make sure you discuss any questions you have with your health care provider. Document Revised: 05/31/2018 Document Reviewed: 05/31/2018 Elsevier Patient Education  2021 [employer].

## 2020-07-01 LAB — PARATHYROID HORMONE, INTACT (NO CA): PTH: 24 pg/mL (ref 14–64)

## 2020-07-01 NOTE — Progress Notes (Signed)
Blood results are stable should get yearly blood count minor abnormality of 1 liver test that may be clinically insignificant.  But would follow up.  Please arrange repeat LFTs in about 4 months diagnosis abnormal LFTs.  No office visit needed.  If doing well.

## 2020-07-01 NOTE — Addendum Note (Signed)
Addended by: Johnella Moloney on: 07/01/2020 03:51 PM   Modules accepted: Orders

## 2020-07-01 NOTE — Addendum Note (Signed)
Addended by: Johnella Moloney on: 07/01/2020 03:46 PM   Modules accepted: Orders

## 2020-07-23 ENCOUNTER — Encounter: Payer: Self-pay | Admitting: Endocrinology

## 2020-07-29 ENCOUNTER — Telehealth: Payer: Self-pay | Admitting: Internal Medicine

## 2020-07-29 NOTE — Telephone Encounter (Signed)
Patient is requesting a referral to be sent to [facility] to Dr. Leslie Dales who is an Actor. She gave the phone number which is [phone].

## 2020-07-29 NOTE — Telephone Encounter (Signed)
Pt is calling in stating that she does not want to go to the below she is wanting to cancel the request.

## 2020-07-30 NOTE — Telephone Encounter (Signed)
Deb took care of referral

## 2020-07-30 NOTE — Telephone Encounter (Signed)
Ok  No referral was entered

## 2020-08-20 ENCOUNTER — Other Ambulatory Visit: Payer: Self-pay

## 2020-08-20 ENCOUNTER — Encounter: Payer: Self-pay | Admitting: Internal Medicine

## 2020-08-20 ENCOUNTER — Ambulatory Visit: Payer: Medicare HMO | Admitting: Internal Medicine

## 2020-08-20 VITALS — BP 140/88 | HR 81 | Ht 61.5 in | Wt 119.2 lb

## 2020-08-20 DIAGNOSIS — M810 Age-related osteoporosis without current pathological fracture: Secondary | ICD-10-CM

## 2020-08-20 DIAGNOSIS — E559 Vitamin D deficiency, unspecified: Secondary | ICD-10-CM | POA: Diagnosis not present

## 2020-08-20 NOTE — Progress Notes (Signed)
Patient ID: Courtney Bray, female   DOB: [birth year], 70 y.o.   MRN: [id number]   This visit occurred during the SARS-CoV-2 public health emergency.  Safety protocols were in place, including screening questions prior to the visit, additional usage of staff PPE, and extensive cleaning of exam room while observing appropriate contact time as indicated for disinfecting solutions.   HPI  Courtney NovelBrenda C Heckart is a 70 y.o.-year-old female, referred by her PCP, Dr. Fabian SharpPanosh, for management of osteoporosis (OP).  Pt was dx with OP in 2011.  I reviewed pt's DXA scan reports along with the pt: Date L1-L4 T score FN T score 33% distal Radius  [facility]  -3.8 RFN: -2.8 LFN: -2.9 n/a  05/27/2015 ([place])  -3.8 RFN: -2.6 LFN: -2.6 n/a  05/01/2010 [facility]  -3.7 RFN: -2.5 LFN: -2.7 n/a   She denies fractures.  No falls.  No dizziness/orthostasis/poor vision. Occasional vertigo 2x a year.  No Previous OP treatments.  She went to [facility] for 6 mo before the Coronavs. pandemic in 08/2018.  She has a h/o vitamin D deficiency. Reviewed available vit D levels: Lab Results  Component Value Date   VD25OH 45 02/07/2020   VD25OH 14.83 (L) 06/26/2019   VD25OH 21.17 (L) 04/08/2017   Pt is on: - vitamin D 5000 units daily (takes this most days)  No weight bearing exercises. She walks 2.5 mi a day.  She does not take high vitamin A doses.  She has been on steroids in the past >> occasionally inj's and tapers, but not for sarcoidosis.  She was on PPIs in the past for 1 year >> stopped.  Menopause was at ~70 y/o.   FH of osteoporosis: mother in her 9280s (pelvic fx. And hip fx.)  No h/o hyper/hypocalcemia or hyperparathyroidism. No h/o kidney stones. Lab Results  Component Value Date   PTH 24 06/30/2020   CALCIUM 9.4 06/30/2020   CALCIUM 9.3 06/26/2019   CALCIUM 8.8 05/30/2018   CALCIUM 9.4 04/08/2017   CALCIUM 9.2 03/26/2016   CALCIUM 9.1 11/10/2015   CALCIUM 9.3  03/25/2015   No h/o thyrotoxicosis. Reviewed TSH recent levels:  Lab Results  Component Value Date   TSH 1.37 06/30/2020   TSH 1.53 03/26/2016   TSH 1.35 03/25/2015   No h/o CKD. 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  She sees [facility]. Renaldo [facility]Huang [facility] She has a low white blood cell count.  ROS: Constitutional: no weight gain, no weight loss, no fatigue, no subjective hyperthermia, no subjective hypothermia, no nocturia Eyes: no blurry vision, no xerophthalmia ENT: no sore throat, no nodules palpated in neck, no dysphagia, no odynophagia, no hoarseness, no tinnitus, no hypoacusis Cardiovascular: no CP, no SOB, no palpitations, no leg swelling Respiratory: no cough, no SOB, no wheezing Gastrointestinal: no N, no V, no D, no C, no acid reflux Musculoskeletal: no muscle, + joint aches (left hip) Skin: no rash, + hair loss Neurological: no tremors, no numbness or tingling/no dizziness/no HAs Psychiatric: no depression, no anxiety + low libido  I reviewed pt's medications, allergies, PMH, social hx, family hx, and changes were documented in the history of present illness. Otherwise, unchanged from my initial visit note.  Past Medical History:  Diagnosis Date  . Chicken pox   . Lymphocytosis   . Osteoporosis   . 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  PFT's 2010:  No obstruction, TLC 3.93 (87%), DLCO 89%. PFT's 2014:  No obstruction, TLC 3.07, DLCO 91%     Past Surgical History:  Procedure Laterality Date  . TUBAL LIGATION  1979  . UTERINE ARTERY EMBOLIZATION  2000   Social History   Socioeconomic History  . Marital status: Married    Spouse name: Not on file  . Number of children: 4  . Years of education: Not on file  . Highest  education level: Not on file  Occupational History  . Occupation: Self-employed  Tobacco Use  . Smoking status: Never Smoker  . Smokeless tobacco: Never Used  Vaping Use  . Vaping Use: Never used  Substance and Sexual Activity  . Alcohol use: Yes    Alcohol/week: 0.0 standard drinks    Comment: 1 drink of wine 1-2 times a week  . Drug use: No  . Sexual activity: Yes    Partners: Male  Other Topics Concern  . Not on file  Social History Narrative   5-6 hours of sleep per night   Lives with her husband who is retired Engineer, 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   Orig from [place]   in [place] since 1988   [name]    Social Determinants of Health   Financial Resource Strain: Low Risk   . Difficulty of Paying Living Expenses: Not hard at all  Food Insecurity: No Food Insecurity  . Worried About Programme researcher, broadcasting/film/video in the Last Year: Never true  . Ran Out of Food in the Last Year: Never true  Transportation Needs: No Transportation Needs  . Lack of Transportation (Medical): No  . Lack of Transportation (Non-Medical): No  Physical Activity: Sufficiently Active  . Days of Exercise per Week: 5 days  . Minutes of Exercise per Session: 50 min  Stress: No Stress Concern Present  . Feeling of Stress : Not at all  Social Connections: Moderately Integrated  . Frequency of Communication with Friends and Family: More than three times a week  . Frequency of Social Gatherings with Friends and Family: Once a week  . Attends Religious Services: More than 4 times per year  . Active Member of Clubs or Organizations: No  . Attends Banker Meetings: Never  . Marital Status: Married  Catering manager Violence: Not At Risk  . Fear of Current or Ex-Partner: No  . Emotionally Abused: No  . Physically Abused: No   . Sexually Abused: No   Current Outpatient Medications on File Prior to Visit  Medication Sig Dispense Refill  . clobetasol cream (TEMOVATE) 0.05 % Apply topically as needed.    Marland Kitchen HUMIRA PEN 40 MG/0.8ML PNKT Inject every other week for sarcoidosis    . Multiple Vitamin (MULTIVITAMIN) tablet Take 1 tablet by mouth daily.    Marland Kitchen VITAMIN D PO Take 5,000 Units by mouth daily.     No current facility-administered medications on file prior to visit.   Allergies  Allergen Reactions  . Amoxicillin Rash  . Vitamin D Analogs Rash    high dose  Pill  had rash    Family History  Problem Relation Age of Onset  . Heart disease Father   . Stroke Father   . Dementia Father   . Dementia Mother   . High blood pressure Mother   . High blood pressure Sister        with hypertesnive renal disease   PE: BP 140/88   Pulse 81   Ht 5' 1.5" (1.562 m)   Wt 119 lb 3.2 oz (54.1 kg)   SpO2 98%   BMI 22.16 kg/m  Wt Readings from Last 3 Encounters:  08/20/20 119 lb 3.2 oz (54.1 kg)  06/30/20 119 lb (54 kg)  01/17/20 116 lb 6.4 oz (52.8 kg)   Constitutional: Normal weight, in NAD. No kyphosis. Eyes: PERRLA, EOMI, no exophthalmos ENT: moist mucous membranes, no thyromegaly, no cervical lymphadenopathy Cardiovascular: RRR, No MRG Respiratory: CTA B Gastrointestinal: abdomen soft, NT, ND, BS+ Musculoskeletal: no deformities, strength intact in all 4.  No spine tenderness to palpation. Skin: moist, warm, no rashes Neurological: no tremor with outstretched hands, DTR normal in all 4  Assessment: 1. Osteoporosis  2.  Vitamin D deficiency  Plan: 1. Osteoporosis - likely postmenopausal/age-related, she has FH of OP - Discussed about increased risk of fracture, depending on the T score, greatly increased when the T score is lower than -2.5, but it is actually a continuum and -2.5 should not be regarded as an absolute threshold. We reviewed her 3 available DXA scan reports together, and I explained  that based on the T scores, she has an increased risk for fractures.  The T-scores decreased from 2011 to 2021, however, these are not completely comparable since each of the 3 scans were done on different machines.  We discussed that going forward, we need to continue to [facility]. - we reviewed her dietary and supplemental calcium and vitamin D intake. I recommended to make sure she gets 1000-1200 mg of calcium daily preferentially from diet and I will check vit D today to see if her supplementation dose is sufficient that she takes 5000 units daily. - discussed fall precautions   - given handout from [facility] Re: weight bearing exercises - advised to do this every day or at least 5/7 days - I also recommended the [facility] - for skeletal loading.  She has been doing this in the but she stopped after the coronavirus pandemic started (08/2018).  She is interested in resuming it. - we discussed about maintaining a good amount of protein in her diet. The recommended daily protein intake is ~0.8 g per kilogram per day (approximately 45 g/day for her). I advised her to try to aim for this amount, since a diet low in proteins can exacerbate osteoporosis. Also, avoid smoking or >2 drinks of alcohol a day. -We discussed about her diet and I recommended the following book for the concept of low acid eating:  She is lactose intolerant. - We discussed about the different medication classes, benefits and side effects (including atypical fractures and ONJ..  - I explained that first options are sq denosumab (Prolia) sq every 6 months for 6-10 years and zoledronic acid (Reclast) iv 1x a year for 2-3 years. I would use Teriparatide/Abaloparatide (which are daily subcu medication) or Romosozumab (monthly) as a last resort.  - Pt was given reading information about Prolia, and I explained the mechanism of action and expected benefits.  - Will check the  following labs  today -we will repeat a BMP so we can submit the Prolia application: Orders Placed This Encounter  Procedures  . VITAMIN D 25 Hydroxy (Vit-D Deficiency, Fractures)  . BASIC METABOLIC PANEL WITH GFR  - if labs normal, will arrange for a Prolia inj - will check a new DXA scan in 2 years after the previous, hopefully at least 1.5 years after starting Prolia -  I explained that the first indication that the treatment is working is her not having anymore fractures. DXA scan changes are secondary: unchanged or slightly higher T-scores are desirable - will see pt back in a year  2.  Vitamin D deficiency -She was previously on ergocalciferol 50,000 units weekly, but she did not tolerate this well -She currently takes 5000 units daily and she tolerates the new dose but she only remembers to take it several times a week. -Previous vitamin D was normal in 01/2020 -We will recheck a vitamin D level today  Component     Latest Ref Rng & Units 08/20/2020  Glucose     65 - 99 mg/dL 79  BUN     7 - 25 mg/dL 15  Creatinine     7.93 - 0.99 mg/dL 9.03  GFR, Est Non African American     > OR = 60 mL/min/1.15m2 91  GFR, Est African American     > OR = 60 mL/min/1.36m2 105  BUN/Creatinine Ratio     6 - 22 (calc) NOT APPLICABLE  Sodium     135 - 146 mmol/L 139  Potassium     3.5 - 5.3 mmol/L 4.4  Chloride     98 - 110 mmol/L 104  CO2     20 - 32 mmol/L 26  Calcium     8.6 - 10.4 mg/dL 9.1  Vitamin D, [id number]     30.0 - 100.0 ng/mL 41.2   Labs are normal.  We will go ahead with the Prolia preauthorization.  Carlus Pavlov, MD PhD [facility]

## 2020-08-20 NOTE — Patient Instructions (Addendum)
Please stop at the lab.  Please continue vitamin D 5000 units daily.  Please try to get ~1200 mg calcium, preferentially from the diet.  Please return for another visit in 1 year.  How Can I Prevent Falls? Men and women with osteoporosis need to take care not to fall down. Falls can break bones. Some reasons people fall are: Poor vision  Poor balance  Certain diseases that affect how you walk  Some types of medicine, such as sleeping pills.  Some tips to help prevent falls outdoors are: Use a cane or walker  Wear rubber-soled shoes so you don't slip  Walk on grass when sidewalks are slippery  In winter, put salt or kitty litter on icy sidewalks.  Some ways to help prevent falls indoors are: Keep rooms free of clutter, especially on floors  Use plastic or carpet runners on slippery floors  Wear low-heeled shoes that provide good support  Do not walk in socks, stockings, or slippers  Be sure carpets and area rugs have skid-proof backs or are tacked to the floor  Be sure stairs are well lit and have rails on both sides  Put grab bars on bathroom walls near tub, shower, and toilet  Use a rubber bath mat in the shower or tub  Keep a flashlight next to your bed  Use a sturdy step stool with a handrail and wide steps  Add more lights in rooms (and night lights) Buy a cordless phone to keep with you so that you don't have to rush to the phone       when it rings and so that you can call for help if you fall.   (adapted from http://www.niams.NightlifePreviews.se)  Please check out the following book about best diet for bone health:   Exercise for Strong Bones (from [place]) There are two types of exercises that are important for building and maintaining bone density:  weight-bearing and muscle-strengthening exercises. Weight-bearing Exercises These exercises include activities that make you move against gravity while  staying upright. Weight-bearing exercises can be high-impact or low-impact. High-impact weight-bearing exercises help build bones and keep them strong. If you have broken a bone due to osteoporosis or are at risk of breaking a bone, you may need to avoid high-impact exercises. If you're not sure, you should check with your healthcare provider. Examples of high-impact weight-bearing exercises are: . Dancing . Doing high-impact aerobics . Hiking . Jogging/running . Jumping Rope . Stair climbing . Tennis Low-impact weight-bearing exercises can also help keep bones strong and are a safe alternative if you cannot do high-impact exercises. Examples of low-impact weight-bearing exercises are: . Using elliptical training machines . Doing low-impact aerobics . Using stair-step machines . Fast walking on a treadmill or outside Muscle-Strengthening Exercises These exercises include activities where you move your body, a weight or some other resistance against gravity. They are also known as resistance exercises and include: . Lifting weights . Using elastic exercise bands . Using weight machines . Lifting your own body weight . Functional movements, such as standing and rising up on your toes Yoga and Pilates can also improve strength, balance and flexibility. However, certain positions may not be safe for people with osteoporosis or those at increased risk of broken bones. For example, exercises that have you bend forward may increase the chance of breaking a bone in the spine. A physical therapist should be able to help you learn which exercises are safe and appropriate for you. Non-Impact Exercises  Non-impact exercises can help you to improve balance, posture and how well you move in everyday activities. These exercises can also help to increase muscle strength and decrease the risk of falls and broken bones. Some of these exercises include: . Balance exercises that strengthen your legs and test  your balance, such as Tai Chi, can decrease your risk of falls. . Posture exercises that improve your posture and reduce rounded or "sloping" shoulders can help you decrease the chance of breaking a bone, especially in the spine. . Functional exercises that improve how well you move can help you with everyday activities and decrease your chance of falling and breaking a bone. For example, if you have trouble getting up from a chair or climbing stairs, you should do these activities as exercises. A physical therapist can teach you balance, posture and functional exercises. Starting a New Exercise Program If you haven't exercised regularly for a while, check with your healthcare provider before beginning a new exercise program--particularly if you have health problems such as heart disease, diabetes or high blood pressure. If you're at high risk of breaking a bone, you should work with a physical therapist to develop a safe exercise program. Once you have your healthcare provider's approval, start slowly. If you've already broken bones in the spine because of osteoporosis, be very careful to avoid activities that require reaching down, bending forward, rapid twisting motions, heavy lifting and those that increase your chance of a fall. As you get started, your muscles may feel sore for a day or two after you exercise. If soreness lasts longer, you may be working too hard and need to ease up. Exercises should be done in a pain-free range of motion. How Much Exercise Do You Need? Weight-bearing exercises 30 minutes on most days of the week. Do a 30-minutesession or multiple sessions spread out throughout the day. The benefits to your bones are the same.   Muscle-strengthening exercises Two to three days per week. If you don't have much time for strengthening/resistance training, do small amounts at a time. You can do just one body part each day. For example do arms one day, legs the next and trunk the next.  You can also spread these exercises out during your normal day.  Balance, posture and functional exercises Every day or as often as needed. You may want to focus on one area more than the others. If you have fallen or lose your balance, spend time doing balance exercises. If you are getting rounded shoulders, work more on posture exercises. If you have trouble climbing stairs or getting up from the couch, do more functional exercises. You can also perform these exercises at one time or spread them during your day. Work with a phyiscal therapist to learn the right exercises for you.   Denosumab: Patient drug information (Up-to-date) Copyright [phone] [place] rights reserved.  Brand Names: [place]  ProliaDelton See What is this drug used for?  .It is used to treat soft, brittle bones (osteoporosis).  .It is used for bone growth.  .It is used when treating some cancers.  .It may be given to you for other reasons. Talk with the doctor. What do I need to tell my doctor BEFORE I take this drug?  All products:  .If you have an allergy to denosumab or any other part of this drug.  .If you are allergic to any drugs like this one, any other drugs, foods, or other substances. Tell your doctor about  the allergy and what signs you had, like rash; hives; itching; shortness of breath; wheezing; cough; swelling of face, lips, tongue, or throat; or any other signs.  .If you have low calcium levels.  ProliaT:  .If you are pregnant or may be pregnant. Do not take this drug if you are pregnant.  This is not a list of all drugs or health problems that interact with this drug.  Tell your doctor and pharmacist about all of your drugs (prescription or OTC, natural products, vitamins) and health problems. You must check to make sure that it is safe for you to take this drug with all of your drugs and health problems. Do not start, stop, or change the dose of any drug without checking with your doctor. What are  some things I need to know or do while I take this drug?  All products:  .Tell dentists, surgeons, and other doctors that you use this drug.  .This drug may raise the chance of a broken leg. Talk with your doctor.  .Have your blood work checked. Talk with your doctor.  .Have a bone density test. Talk with your doctor.  .Take calcium and vitamin D as you were told by your doctor.  .Have a dental exam before starting this drug.  .Take good care of your teeth. See a dentist often.  .If you smoke, talk with your doctor.  .Do not give to a child. Talk with your doctor.  .Tell your doctor if you are breast-feeding. You will need to talk about any risks to your baby.  Xgeva:  .This drug may cause harm to the unborn baby if you take it while you are pregnant. If you get pregnant while taking this drug, call your doctor right away.  ProliaT:  .Very bad infections have been reported with use of this drug. If you have any infection, are taking antibiotics now or in the recent past, or have many infections, talk with your doctor.  .You may have more chance of getting an infection. Wash hands often. Stay away from people with infections, colds, or flu.  .Use birth control that you can trust to prevent pregnancy while taking this drug.  .If you are a man and your sex partner is pregnant or gets pregnant at any time while you are being treated, talk with your doctor. What are some side effects that I need to call my doctor about right away?  WARNING/CAUTION: Even though it may be rare, some people may have very bad and sometimes deadly side effects when taking a drug. Tell your doctor or get medical help right away if you have any of the following signs or symptoms that may be related to a very bad side effect:  All products:  .Signs of an allergic reaction, like rash; hives; itching; red, swollen, blistered, or peeling skin with or without fever; wheezing; tightness in the chest or throat; trouble breathing  or talking; unusual hoarseness; or swelling of the mouth, face, lips, tongue, or throat.  .Signs of low calcium levels like muscle cramps or spasms, numbness and tingling, or seizures.  .Mouth sores.  .Any new or strange groin, hip, or thigh pain.  .This drug may cause jawbone problems. The chance may be higher the longer you take this drug. The chance may be higher if you have cancer, dental problems, dentures that do not fit well, anemia, blood clotting problems, or an infection. The chance may also be higher if you are having dental work or if  you are getting chemo, some steroid drugs, or radiation. Call your doctor right away if you have jaw swelling or pain.  Xgeva:  .Not hungry.  .Muscle pain or weakness.  .Seizures.  .Shortness of breath.  ProliaT:  .Signs of infection. These include a fever of 100.80F (38C) or higher, chills, very bad sore throat, ear or sinus pain, cough, more sputum or change in color of sputum, pain with passing urine, mouth sores, wound that will not heal, or anal itching or pain.  .Signs of a pancreas problem (pancreatitis) like very bad stomach pain, very bad back pain, or very bad upset stomach or throwing up.  .Chest pain.  .A heartbeat that does not feel normal.  .Very bad skin irritation.  .Feeling very tired or weak.  .Bladder pain or pain when passing urine or change in how much urine is passed.  .Passing urine often.  .Swelling in the arms or legs. What are some other side effects of this drug?  All drugs may cause side effects. However, many people have no side effects or only have minor side effects. Call your doctor or get medical help if any of these side effects or any other side effects bother you or do not go away:  Xgeva:  .Feeling tired or weak.  Marland KitchenHeadache.  Marland KitchenUpset stomach or throwing up.  .Loose stools (diarrhea).  .Cough.  ProliaT:  .Back pain.  .Muscle or joint pain.  .Sore throat.  .Runny nose.  .Pain in arms or legs.  These are  not all of the side effects that may occur. If you have questions about side effects, call your doctor. Call your doctor for medical advice about side effects.  You may report side effects to your national health agency. How is this drug best taken?  Use this drug as ordered by your doctor. Read and follow the dosing on the label closely.  .It is given as a shot into the fatty part of the skin. What do I do if I miss a dose?  .Call the doctor to find out what to do. How do I store and/or throw out this drug?  Marland KitchenThis drug will be given to you in a hospital or doctor's office. You will not store it at home.  Marland KitchenKeep all drugs out of the reach of children and pets.  .Check with your pharmacist about how to throw out unused drugs.  General drug facts  .If your symptoms or health problems do not get better or if they become worse, call your doctor.  .Do not share your drugs with others and do not take anyone else's drugs.  Marland KitchenKeep a list of all your drugs (prescription, natural products, vitamins, OTC) with you. Give this list to your doctor.  .Talk with the doctor before starting any new drug, including prescription or OTC, natural products, or vitamins.  .Some drugs may have another patient information leaflet. If you have any questions about this drug, please talk with your doctor, pharmacist, or other health care provider.  .If you think there has been an overdose, call your poison control center or get medical care right away. Be ready to tell or show what was taken, how much, and when it happened.

## 2020-08-21 LAB — BASIC METABOLIC PANEL WITH GFR
BUN: 15 mg/dL (ref 7–25)
CO2: 26 mmol/L (ref 20–32)
Calcium: 9.1 mg/dL (ref 8.6–10.4)
Chloride: 104 mmol/L (ref 98–110)
Creat: 0.65 mg/dL (ref 0.50–0.99)
GFR, Est African American: 105 mL/min/{1.73_m2} (ref >=60)
GFR, Est Non African American: 91 mL/min/{1.73_m2} (ref >=60)
Glucose, Bld: 79 mg/dL (ref 65–99)
Potassium: 4.4 mmol/L (ref 3.5–5.3)
Sodium: 139 mmol/L (ref 135–146)

## 2020-08-21 LAB — VITAMIN D 25 HYDROXY (VIT D DEFICIENCY, FRACTURES): Vit D, 25-Hydroxy: 41.2 ng/mL (ref 30.0–100.0)

## 2020-08-28 ENCOUNTER — Telehealth: Payer: Self-pay

## 2020-08-28 NOTE — Telephone Encounter (Signed)
Prolia VOB initiated  Pt was dx with OP in 2011.  I reviewed pt's DXA scan reports along with the pt: Date L1-L4 T score FN T score 33% distal Radius  [facility]/09/[facility])  -3.8 RFN: -2.8 LFN: -2.9 n/a  05/27/2015 ([facility])  -3.8 RFN: -2.6 LFN: -2.6 n/a  05/01/2010 [facility])  -3.7 RFN: -2.5 LFN: -2.7 n/a   She denies fractures.  No falls.  No dizziness/orthostasis/poor vision. Occasional vertigo 2x a year.  No Previous OP treatments.  She went to [facility] for 6 mo before the Coronavs. pandemic in 08/2018.  She has a h/o vitamin D deficiency. Reviewed available vit D levels: Recent Labs       Lab Results  Component Value Date   VD25OH 45 02/07/2020   VD25OH 14.83 (L) 06/26/2019   VD25OH 21.17 (L) 04/08/2017     Pt is on: - vitamin D 5000 units daily (takes this most days)  No weight bearing exercises. She walks 2.5 mi a day.  She does not take high vitamin A doses.  She has been on steroids in the past >> occasionally inj's and tapers, but not for sarcoidosis.  She was on PPIs in the past for 1 year >> stopped.  Menopause was at ~70 y/o.   FH of osteoporosis: mother in her 53s (pelvic fx. And hip fx.)  No h/o hyper/hypocalcemia or hyperparathyroidism. No h/o kidney stones. Recent Labs       Lab Results  Component Value Date   PTH 24 06/30/2020   CALCIUM 9.4 06/30/2020   CALCIUM 9.3 06/26/2019   CALCIUM 8.8 05/30/2018   CALCIUM 9.4 04/08/2017   CALCIUM 9.2 03/26/2016   CALCIUM 9.1 11/10/2015   CALCIUM 9.3 03/25/2015     No h/o thyrotoxicosis. Reviewed TSH recent levels:  Recent Labs  Lab Results  Component Value Date   TSH 1.37 06/30/2020   TSH 1.53 03/26/2016   TSH 1.35 03/25/2015     No h/o CKD. 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  She sees  Dr. Renaldo Reel at [facility] dermatology She has a low white blood cell count.

## 2020-08-28 NOTE — Progress Notes (Signed)
See Encounter 08/28/20 "Prolia VOB".

## 2020-09-02 NOTE — Telephone Encounter (Signed)
PA REQUIRED  Prolia OOP cost: 20% Admin fee: 20%  PRIMARY MEDICAL BENEFIT DETAILS (PHYSICIAN PURCHASE, OR REFERRAL TO TREATING SITE) COVERAGE AVAILABLE: Yes  COVERAGE DETAILS: The benefits provided on this Verification of Benefits form are Medical Benefits and are the patient's In-Network benefits for Prolia. If you would like Pharmacy Benefits for Prolia, please call [phone].  AUTHORIZATION REQUIRED: Yes  PA PROCESS DETAILS: PA is required. Call [phone] or complete the PA form available at [email or web address]

## 2020-09-04 NOTE — Telephone Encounter (Signed)
PA initiated via [email or web address] Key: [id number]

## 2020-09-17 DIAGNOSIS — H2513 Age-related nuclear cataract, bilateral: Secondary | ICD-10-CM | POA: Diagnosis not present

## 2020-09-17 DIAGNOSIS — H5213 Myopia, bilateral: Secondary | ICD-10-CM | POA: Diagnosis not present

## 2020-09-26 NOTE — Telephone Encounter (Signed)
Prior auth re-submitted  Gildardo Pounds (Key: [id number])Need help? Call us at [phone] Status Sent to [employer] The plan will fax you a determination, typically within 1 to 5 business days.

## 2020-09-26 NOTE — Telephone Encounter (Signed)
Hi Tileshia,  Please advise of outcome once prior authorization determination fax has been received .   Thanks!

## 2020-09-29 NOTE — Telephone Encounter (Signed)
Fax received from [facility] referral number: [id number] Request already approved valid from 09/04/2020 through 3/17/203 Questions call [phone]

## 2020-10-01 NOTE — Telephone Encounter (Signed)
Pt ready for scheduling  Out-of-pocket cost due at time of visit: $280  Prolia co-insurance: 20% ($255) Admin fee co-insurance: 20% ($25)  Deductible does not apply  PA required - APPROVED PA# [id number] valid 09/04/20-09/04/21

## 2020-10-08 NOTE — Telephone Encounter (Signed)
Called pt to book Prolia appt, pt states she has decided she is not going to go forward with the Prolia or any other drug to help her bones. Pt states the Dr mentioned before there are other natural alternatives to help build strength in her bones such as exercise and she would like to go that route. Pt would like a call regarding the path she should take now to help her bones.  Ph# [phone]

## 2020-10-08 NOTE — Telephone Encounter (Signed)
Noted.   Pt archived in Prolia portal and removed from Prolia log book.

## 2020-11-03 ENCOUNTER — Other Ambulatory Visit (INDEPENDENT_AMBULATORY_CARE_PROVIDER_SITE_OTHER): Payer: Medicare HMO

## 2020-11-03 ENCOUNTER — Other Ambulatory Visit: Payer: Self-pay

## 2020-11-03 DIAGNOSIS — R945 Abnormal results of liver function studies: Secondary | ICD-10-CM | POA: Diagnosis not present

## 2020-11-03 DIAGNOSIS — R7989 Other specified abnormal findings of blood chemistry: Secondary | ICD-10-CM

## 2020-11-03 LAB — HEPATIC FUNCTION PANEL
ALT: 21 U/L (ref 0–35)
AST: 19 U/L (ref 0–37)
Albumin: 4 g/dL (ref 3.5–5.2)
Alkaline Phosphatase: 86 U/L (ref 39–117)
Bilirubin, Direct: 0.1 mg/dL (ref 0.0–0.3)
Total Bilirubin: 0.6 mg/dL (ref 0.2–1.2)
Total Protein: 7.2 g/dL (ref 6.0–8.3)

## 2020-11-08 ENCOUNTER — Telehealth: Payer: Self-pay | Admitting: Student in an Organized Health Care Education/Training Program

## 2020-11-17 NOTE — Progress Notes (Signed)
Now normal LFT

## 2020-11-25 NOTE — Telephone Encounter (Signed)
error 

## 2021-01-05 ENCOUNTER — Other Ambulatory Visit: Payer: Self-pay

## 2021-01-05 ENCOUNTER — Ambulatory Visit (INDEPENDENT_AMBULATORY_CARE_PROVIDER_SITE_OTHER): Payer: Medicare HMO | Admitting: Family Medicine

## 2021-01-05 VITALS — BP 134/80 | HR 101 | Temp 99.2°F | Wt 115.3 lb

## 2021-01-05 DIAGNOSIS — K5900 Constipation, unspecified: Secondary | ICD-10-CM | POA: Diagnosis not present

## 2021-01-05 NOTE — Patient Instructions (Signed)
Increase fluid intake  Try to get at least 25 g dietary fiber per day  Get back to regular walking as tolerated  Consider over-the-counter MiraLAX 17 g once daily as needed for constipation symptoms-May take this daily if necessary to get reestablished with more normal bowel movements  Be in touch if symptoms not resolving over the next week

## 2021-01-05 NOTE — Progress Notes (Signed)
Established Patient Office Visit  Subjective:  Patient ID: Courtney Bray, female    DOB: 1951/06/19  Age: 70 y.o. MRN: [id number]  CC:  Chief Complaint  Patient presents with   Constipation    X 1 week, abdominal pain, has not had a BM since thursday    HPI [name] presents for constipation symptoms.  This started around Thursday of last week.  She is not had a bowel movement since then.  This is atypical for her.  She has had some occasional mild nausea but no vomiting.  Some diffuse bilateral lower abdominal discomfort.  No fever.  No chills.  No dysuria.  She had colonoscopy 2010.  No history of polyps.  Had Cologuard 12/19 which was negative.  She took 1 Dulcolax which did not seem to help.  Does not take any regular oral medications.  She is on Humira for sarcoidosis.  No recent anticholinergic or opioid medications.  She tends to eat a lot of vegetables and thinks she is getting plenty of fiber.  She is aware she may not be drinking enough.  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  PFT's 2010:  No obstruction, TLC 3.93 (87%), DLCO 89%. PFT's 2014:  No obstruction, TLC 3.07, DLCO 91%      Past Surgical History:  Procedure Laterality Date   TUBAL LIGATION  1979   UTERINE ARTERY EMBOLIZATION  2000    Family History  Problem Relation Age of Onset   Heart disease Father    Stroke Father    Dementia Father    High blood pressure Father    Dementia Mother    High blood pressure Mother    High blood pressure Sister        with hypertesnive renal disease    Social History   Socioeconomic History   Marital status: Married    Spouse name: Not on file   Number of children: 4   Years of education: Not on file   Highest education level: Not on file  Occupational History    Occupation: Self-employed  Tobacco Use   Smoking status: Never   Smokeless tobacco: Never  Vaping Use   Vaping Use: Never used  Substance and Sexual Activity   Alcohol use: Yes    Alcohol/week: 0.0 standard drinks    Comment: 1 drink of wine 1-2 times a week   Drug use: No   Sexual activity: Yes    Partners: Male  Other Topics Concern   Not on file  Social History Narrative   5-6 hours of sleep per night   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   Orig from [place]   in [place] since [place]    Social Determinants of Health   Financial Resource Strain: Low Risk    Difficulty of Paying Living Expenses: Not hard at all  Food Insecurity: No Food Insecurity   Worried About Charity fundraiser  in the Last Year: Never true   [place] in the Last Year: Never true  Transportation Needs: No Transportation Needs   Lack of Transportation (Medical): No   Lack of Transportation (Non-Medical): No  Physical Activity: Sufficiently Active   Days of Exercise per Week: 5 days   Minutes of Exercise per Session: 50 min  Stress: No Stress Concern Present   Feeling of Stress : Not at all  Social Connections: Moderately Integrated   Frequency of Communication with Friends and Family: More than three times a week   Frequency of Social Gatherings with Friends and Family: Once a week   Attends Religious Services: More than 4 times per year   Active Member of [employer] or Organizations: No   Attends Archivist Meetings: Never   Marital Status: Married  Human resources officer Violence: Not At Risk   Fear of Current or Ex-Partner: No   Emotionally Abused: No   Physically Abused: No   Sexually Abused: No    Outpatient Medications Prior to Visit  Medication Sig Dispense  Refill   clobetasol cream (TEMOVATE) 0.05 % Apply topically as needed.     HUMIRA PEN 40 MG/0.8ML PNKT Inject every other week for sarcoidosis     Multiple Vitamin (MULTIVITAMIN) tablet Take 1 tablet by mouth daily.     VITAMIN D PO Take 5,000 Units by mouth daily.     No facility-administered medications prior to visit.    Allergies  Allergen Reactions   Amoxicillin Rash   Vitamin D Analogs Rash    high dose  Pill had rash     ROS Review of Systems  Constitutional:  Negative for appetite change, chills, fever and unexpected weight change.  Cardiovascular:  Negative for chest pain.  Gastrointestinal:  Positive for abdominal pain and constipation. Negative for blood in stool and diarrhea.     Objective:    Physical Exam Vitals reviewed.  Constitutional:      Appearance: Normal appearance.  Cardiovascular:     Rate and Rhythm: Normal rate and regular rhythm.  Pulmonary:     Effort: Pulmonary effort is normal.     Breath sounds: Normal breath sounds.  Abdominal:     Palpations: Abdomen is soft.     Tenderness: There is no abdominal tenderness.  Genitourinary:    Comments: Digital exam reveals no impaction.  She has a small amount of solid stool in the rectal vault but not particularly firm to palpation.  No rectal masses noted. Neurological:     Mental Status: She is alert.    BP 134/80 (BP Location: Left Arm, Patient Position: Sitting, Cuff Size: Normal)   Pulse (!) 101   Temp 99.2 F (37.3 C) (Oral)   Wt 115 lb 4.8 oz (52.3 kg)   SpO2 97%   BMI 21.43 kg/m  Wt Readings from Last 3 Encounters:  01/05/21 115 lb 4.8 oz (52.3 kg)  08/20/20 119 lb 3.2 oz (54.1 kg)  06/30/20 119 lb (54 kg)     Health Maintenance Due  Topic Date Due   Zoster Vaccines- Shingrix (1 of 2) Never done   COLONOSCOPY (Pts 45-7yr Insurance coverage will need to be confirmed)  02/15/2019   COVID-19 Vaccine (4 - Booster for [employer]) 05/16/2020    There are no preventive care  reminders to display for this patient.  Lab Results  Component Value Date   TSH 1.37 06/30/2020   Lab Results  Component Value Date   WBC 4.1  06/30/2020   HGB 14.7 06/30/2020   HCT 42.9 06/30/2020   MCV 94.2 06/30/2020   PLT 290.0 06/30/2020   Lab Results  Component Value Date   NA 139 08/20/2020   K 4.4 08/20/2020   CHLORIDE 106 11/10/2015   CO2 26 08/20/2020   GLUCOSE 79 08/20/2020   BUN 15 08/20/2020   CREATININE 0.65 08/20/2020   BILITOT 0.6 11/03/2020   ALKPHOS 86 11/03/2020   AST 19 11/03/2020   ALT 21 11/03/2020   PROT 7.2 11/03/2020   ALBUMIN 4.0 11/03/2020   CALCIUM 9.1 08/20/2020   ANIONGAP 9 11/10/2015   EGFR >90 11/10/2015   GFR 88.59 06/30/2020   Lab Results  Component Value Date   CHOL 203 (H) 06/30/2020   Lab Results  Component Value Date   HDL 67.20 06/30/2020   Lab Results  Component Value Date   LDLCALC 121 (H) 06/30/2020   Lab Results  Component Value Date   TRIG 72.0 06/30/2020   Lab Results  Component Value Date   CHOLHDL 3 06/30/2020   No results found for: HGBA1C    Assessment & Plan:   New onset constipation.  Onset about 5 days ago.  No evidence for impaction.  Recent Cologuard 2019 negative  -Increase fluid intake -Aim for at least 25 g fiber per day through diet -Get back to regular walking -Consider over-the-counter aids such as MiraLAX -Be in touch of symptoms not improving over the next week  No orders of the defined types were placed in this encounter.   Follow-up: No follow-ups on file.    Carolann Littler, MD

## 2021-01-28 DIAGNOSIS — Z23 Encounter for immunization: Secondary | ICD-10-CM | POA: Diagnosis not present

## 2021-02-03 ENCOUNTER — Other Ambulatory Visit: Payer: Self-pay

## 2021-02-03 ENCOUNTER — Ambulatory Visit (INDEPENDENT_AMBULATORY_CARE_PROVIDER_SITE_OTHER): Payer: Medicare HMO

## 2021-02-03 VITALS — BP 124/72 | HR 80 | Temp 98.3°F | Wt 114.8 lb

## 2021-02-03 DIAGNOSIS — Z0000 Encounter for general adult medical examination without abnormal findings: Secondary | ICD-10-CM

## 2021-02-03 NOTE — Patient Instructions (Signed)
Courtney Bray , Thank you for taking time to come for your Medicare Wellness Visit. I appreciate your ongoing commitment to your health goals. Please review the following plan we discussed and let me know if I can assist you in the future.   Screening recommendations/referrals: Colonoscopy: Pt stated colorguard was negative last year Mammogram: Done 06/09/20 repeat every year  Bone Density: Done 06/09/20 repeat every 2 years  Recommended yearly ophthalmology/optometry visit for glaucoma screening and checkup Recommended yearly dental visit for hygiene and checkup  Vaccinations: Influenza vaccine: Due Pneumococcal vaccine: Completed  Tdap vaccine: Due and discussed  Shingles vaccine: Shingrix 1st dose 01/28/21 Covid-19:Completed 2/13, 3/10, & 02/14/20  Advanced directives: Copy provided   Conditions/risks identified: Continue plant based diet and increase walking  Next appointment: Follow up in one year for your annual wellness visit    Preventive Care 65 Years and Older, Female Preventive care refers to lifestyle choices and visits with your health care provider that can promote health and wellness. What does preventive care include? A yearly physical exam. This is also called an annual well check. Dental exams once or twice a year. Routine eye exams. Ask your health care provider how often you should have your eyes checked. Personal lifestyle choices, including: Daily care of your teeth and gums. Regular physical activity. Eating a healthy diet. Avoiding tobacco and drug use. Limiting alcohol use. Practicing safe sex. Taking low-dose aspirin every day. Taking vitamin and mineral supplements as recommended by your health care provider. What happens during an annual well check? The services and screenings done by your health care provider during your annual well check will depend on your age, overall health, lifestyle risk factors, and family history of disease. Counseling  Your  health care provider may ask you questions about your: Alcohol use. Tobacco use. Drug use. Emotional well-being. Home and relationship well-being. Sexual activity. Eating habits. History of falls. Memory and ability to understand (cognition). Work and work Astronomer. Reproductive health. Screening  You may have the following tests or measurements: Height, weight, and BMI. Blood pressure. Lipid and cholesterol levels. These may be checked every 5 years, or more frequently if you are over 67 years old. Skin check. Lung cancer screening. You may have this screening every year starting at age 80 if you have a 30-pack-year history of smoking and currently smoke or have quit within the past 15 years. Fecal occult blood test (FOBT) of the stool. You may have this test every year starting at age 59. Flexible sigmoidoscopy or colonoscopy. You may have a sigmoidoscopy every 5 years or a colonoscopy every 10 years starting at age 82. Hepatitis C blood test. Hepatitis B blood test. Sexually transmitted disease (STD) testing. Diabetes screening. This is done by checking your blood sugar (glucose) after you have not eaten for a while (fasting). You may have this done every 1-3 years. Bone density scan. This is done to screen for osteoporosis. You may have this done starting at age 94. Mammogram. This may be done every 1-2 years. Talk to your health care provider about how often you should have regular mammograms. Talk with your health care provider about your test results, treatment options, and if necessary, the need for more tests. Vaccines  Your health care provider may recommend certain vaccines, such as: Influenza vaccine. This is recommended every year. Tetanus, diphtheria, and acellular pertussis (Tdap, Td) vaccine. You may need a Td booster every 10 years. Zoster vaccine. You may need this after age 26. Pneumococcal  13-valent conjugate (PCV13) vaccine. One dose is recommended after age  60. Pneumococcal polysaccharide (PPSV23) vaccine. One dose is recommended after age 38. Talk to your health care provider about which screenings and vaccines you need and how often you need them. This information is not intended to replace advice given to you by your health care provider. Make sure you discuss any questions you have with your health care provider. Document Released: 07/04/2015 Document Revised: 02/25/2016 Document Reviewed: 04/08/2015 Elsevier Interactive Patient Education  2017 [employer] Prevention in the Home Falls can cause injuries. They can happen to people of all ages. There are many things you can do to make your home safe and to help prevent falls. What can I do on the outside of my home? Regularly fix the edges of walkways and driveways and fix any cracks. Remove anything that might make you trip as you walk through a door, such as a raised step or threshold. Trim any bushes or trees on the path to your home. Use bright outdoor lighting. Clear any walking paths of anything that might make someone trip, such as rocks or tools. Regularly check to see if handrails are loose or broken. Make sure that both sides of any steps have handrails. Any raised decks and porches should have guardrails on the edges. Have any leaves, snow, or ice cleared regularly. Use sand or salt on walking paths during winter. Clean up any spills in your garage right away. This includes oil or grease spills. What can I do in the bathroom? Use night lights. Install grab bars by the toilet and in the tub and shower. Do not use towel bars as grab bars. Use non-skid mats or decals in the tub or shower. If you need to sit down in the shower, use a plastic, non-slip stool. Keep the floor dry. Clean up any water that spills on the floor as soon as it happens. Remove soap buildup in the tub or shower regularly. Attach bath mats securely with double-sided non-slip rug tape. Do not have throw  rugs and other things on the floor that can make you trip. What can I do in the bedroom? Use night lights. Make sure that you have a light by your bed that is easy to reach. Do not use any sheets or blankets that are too big for your bed. They should not hang down onto the floor. Have a firm chair that has side arms. You can use this for support while you get dressed. Do not have throw rugs and other things on the floor that can make you trip. What can I do in the kitchen? Clean up any spills right away. Avoid walking on wet floors. Keep items that you use a lot in easy-to-reach places. If you need to reach something above you, use a strong step stool that has a grab bar. Keep electrical cords out of the way. Do not use floor polish or wax that makes floors slippery. If you must use wax, use non-skid floor wax. Do not have throw rugs and other things on the floor that can make you trip. What can I do with my stairs? Do not leave any items on the stairs. Make sure that there are handrails on both sides of the stairs and use them. Fix handrails that are broken or loose. Make sure that handrails are as long as the stairways. Check any carpeting to make sure that it is firmly attached to the stairs. Fix any carpet  that is loose or worn. Avoid having throw rugs at the top or bottom of the stairs. If you do have throw rugs, attach them to the floor with carpet tape. Make sure that you have a light switch at the top of the stairs and the bottom of the stairs. If you do not have them, ask someone to add them for you. What else can I do to help prevent falls? Wear shoes that: Do not have high heels. Have rubber bottoms. Are comfortable and fit you well. Are closed at the toe. Do not wear sandals. If you use a stepladder: Make sure that it is fully opened. Do not climb a closed stepladder. Make sure that both sides of the stepladder are locked into place. Ask someone to hold it for you, if  possible. Clearly mark and make sure that you can see: Any grab bars or handrails. First and last steps. Where the edge of each step is. Use tools that help you move around (mobility aids) if they are needed. These include: Canes. Walkers. Scooters. Crutches. Turn on the lights when you go into a dark area. Replace any light bulbs as soon as they burn out. Set up your furniture so you have a clear path. Avoid moving your furniture around. If any of your floors are uneven, fix them. If there are any pets around you, be aware of where they are. Review your medicines with your doctor. Some medicines can make you feel dizzy. This can increase your chance of falling. Ask your doctor what other things that you can do to help prevent falls. This information is not intended to replace advice given to you by your health care provider. Make sure you discuss any questions you have with your health care provider. Document Released: 04/03/2009 Document Revised: 11/13/2015 Document Reviewed: 07/12/2014 Elsevier Interactive Patient Education  2017 [employer].

## 2021-02-03 NOTE — Progress Notes (Signed)
Subjective:   Courtney Bray is a 70 y.o. female who presents for Medicare Annual (Subsequent) preventive examination.  Review of Systems     Cardiac Risk Factors include: advanced age (>74men, >63 women)     Objective:    Today's Vitals   02/03/21 1145  BP: 124/72  Pulse: 80  Temp: 98.3 F (36.8 C)  SpO2: 97%  Weight: 114 lb 12.8 oz (52.1 kg)   Body mass index is 21.34 kg/m.  Advanced Directives 02/03/2021 02/11/2020 10/19/2017 06/08/2017 11/19/2015 07/31/2015  Does Patient Have a Medical Advance Directive? Yes No;Yes Yes Yes No No  Type of Estate agent of [employer] Power of [place];Living will - Healthcare Power of Attorney - -  Does patient want to make changes to medical advance directive? - No - Patient declined - No - Patient declined - -  Copy of Healthcare Power of Attorney in Chart? No - copy requested No - copy requested - No - copy requested - -  Would patient like information on creating a medical advance directive? - - - - Yes - Educational materials given Yes - Educational materials given    Current Medications (verified) Outpatient Encounter Medications as of 02/03/2021  Medication Sig   clobetasol cream (TEMOVATE) 0.05 % Apply topically as needed.   HUMIRA PEN 40 MG/0.8ML PNKT Inject every other week for sarcoidosis   Multiple Vitamin (MULTIVITAMIN) tablet Take 1 tablet by mouth daily.   VITAMIN D PO Take 5,000 Units by mouth daily.   No facility-administered encounter medications on file as of 02/03/2021.    Allergies (verified) Amoxicillin and Vitamin d analogs   History: Past Medical History:  Diagnosis Date   Chicken pox    Lymphocytosis    Osteoporosis    Sarcoidosis 02/24/2007   Dxed 1994 by TBBX + ISLD/LN on cxr Primarily manifests as cough and skin changes Tx with Humira by derm at [facility].  PFT's 2010:  No obstruction, TLC 3.93 (87%), DLCO 89%. PFT's 2014:  No obstruction, TLC 3.07, DLCO 91%     Past Surgical  History:  Procedure Laterality Date   TUBAL LIGATION  1979   UTERINE ARTERY EMBOLIZATION  2000   Family History  Problem Relation Age of Onset   Heart disease Father    Stroke Father    Dementia Father    High blood pressure Father    Dementia Mother    High blood pressure Mother    High blood pressure Sister        with hypertesnive renal disease   Social History   Socioeconomic History   Marital status: Married    Spouse name: Not on file   Number of children: 4   Years of education: Not on file   Highest education level: Not on file  Occupational History   Occupation: Self-employed  Tobacco Use   Smoking status: Never   Smokeless tobacco: Never  Vaping Use   Vaping Use: Never used  Substance and Sexual Activity   Alcohol use: Yes    Alcohol/week: 0.0 standard drinks    Comment: 1 drink of wine 1-2 times a week   Drug use: No   Sexual activity: Yes    Partners: Male  Other Topics Concern   Not on file  Social History Narrative   5-6 hours of sleep per night   Lives with her husband who is retired Engineer, agricultural business   Has 4 children and all have moved out   Does not  work outside the home. 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   Orig from [place]   in [place] since 1988   [name]    Social Determinants of Health   Financial Resource Strain: Low Risk    Difficulty of Paying Living Expenses: Not hard at all  Food Insecurity: No Food Insecurity   Worried About Programme researcher, broadcasting/film/video in the Last Year: Never true   Barista in the Last Year: Never true  Transportation Needs: No Transportation Needs   Lack of Transportation (Medical): No   Lack of Transportation (Non-Medical): No  Physical Activity: Sufficiently Active   Days of Exercise per Week: 5 days   Minutes of Exercise per Session: 40 min  Stress: No Stress Concern Present   Feeling of Stress : Not at all  Social  Connections: Moderately Integrated   Frequency of Communication with Friends and Family: More than three times a week   Frequency of Social Gatherings with Friends and Family: More than three times a week   Attends Religious Services: More than 4 times per year   Active Member of [employer] or Organizations: No   Attends Engineer, structural: Never   Marital Status: Married    Tobacco Counseling Counseling given: Not Answered   Clinical Intake:  Pre-visit preparation completed: Yes  Pain : No/denies pain     BMI - recorded: 21.34 Nutritional Status: BMI of 19-24  Normal Nutritional Risks: None Diabetes: No  How often do you need to have someone help you when you read instructions, pamphlets, or other written materials from your doctor or pharmacy?: 1 - Never  Diabetic?No  Interpreter Needed?: No  Information entered by :: Lanier Ensign, LPN   Activities of Daily Living In your present state of health, do you have any difficulty performing the following activities: 02/03/2021 02/11/2020  Hearing? N N  Vision? N N  Difficulty concentrating or making decisions? N N  Walking or climbing stairs? N N  Dressing or bathing? N N  Doing errands, shopping? N N  Preparing Food and eating ? N N  Using the Toilet? N N  In the past six months, have you accidently leaked urine? N Y  Comment - has had some bladder leakage  Do you have problems with loss of bowel control? Y Y  Comment accidents at times Has had some occassional loss of bowel control  Managing your Medications? N N  Managing your Finances? N N  Housekeeping or managing your Housekeeping? N N  Some recent data might be hidden    Patient Care Team: Panosh, Neta Mends, MD as PCP - General (Internal Medicine) Renaldo Reel Georgeanna Harrison, MD as Referring Physician (Dermatology) Tomma Lightning, MD as Consulting Physician (Pulmonary Disease)  Indicate any recent Medical Services you may have received from other than  [facility] providers in the past year (date may be approximate).     Assessment:   This is a routine wellness examination for Courtney Bray.  Hearing/Vision screen Hearing Screening - Comments:: Pt denies any hearing issues  Vision Screening - Comments:: Pt follows up with [facility] for annual exams   Dietary issues and exercise activities discussed: Current Exercise Habits: Home exercise routine, Type of exercise: walking, Time (Minutes): 45, Frequency (Times/Week): 5, Weekly Exercise (Minutes/Week): 225   Goals Addressed  This Visit's Progress    Patient Stated       Continue plant based diet and increase walking        Depression Screen PHQ 2/9 Scores 02/03/2021 06/30/2020 02/11/2020 06/29/2019 05/30/2018 10/19/2017 06/08/2017  PHQ - 2 Score 0 0 0 0 0 0 0  PHQ- 9 Score - - 0 - - - -    Fall Risk Fall Risk  02/03/2021 06/30/2020 02/11/2020 06/29/2019 05/30/2018  Falls in the past year? 0 0 0 0 0  Number falls in past yr: 0 0 0 0 -  Injury with Fall? 0 - 0 0 -  Risk for fall due to : Impaired vision - - - -  Follow up Falls prevention discussed - Falls evaluation completed;Falls prevention discussed Falls evaluation completed -    FALL RISK PREVENTION PERTAINING TO THE HOME:  Any stairs in or around the home? Yes  If so, are there any without handrails? No  Home free of loose throw rugs in walkways, pet beds, electrical cords, etc? Yes  Adequate lighting in your home to reduce risk of falls? Yes   ASSISTIVE DEVICES UTILIZED TO PREVENT FALLS:  Life alert? No  Use of a cane, walker or w/c? No  Grab bars in the bathroom? No  Shower chair or bench in shower? No  Elevated toilet seat or a handicapped toilet? No   TIMED UP AND GO:  Was the test performed? No .   Cognitive Function: MMSE - Mini Mental State Exam 10/19/2017  Not completed: (No Data)     6CIT Screen 02/03/2021  What Year? 0 points  What month? 0 points  What time? 0 points  Count back from 20 0  points  Months in reverse 0 points  Repeat phrase 0 points  Total Score 0    Immunizations Immunization History  Administered Date(s) Administered   Fluad Quad(high Dose 65+) 04/05/2019   Influenza, High Dose Seasonal PF 05/30/2018   PFIZER(Purple Top)SARS-COV-2 Vaccination 08/04/2019, 08/29/2019, 02/14/2020, 09/26/2020   PPD Test 05/11/2013, 04/01/2014, 04/01/2015, 05/29/2015, 04/08/2017, 05/02/2017   Pneumococcal Conjugate-13 04/02/2016   Pneumococcal Polysaccharide-23 09/05/2012, 10/19/2017   Tdap 02/20/2007   Zoster, Live 04/22/2011    TDAP status: Due, Education has been provided regarding the importance of this vaccine. Advised may receive this vaccine at local pharmacy or Health Dept. Aware to provide a copy of the vaccination record if obtained from local pharmacy or Health Dept. Verbalized acceptance and understanding.  Flu Vaccine status: Due, Education has been provided regarding the importance of this vaccine. Advised may receive this vaccine at local pharmacy or Health Dept. Aware to provide a copy of the vaccination record if obtained from local pharmacy or Health Dept. Verbalized acceptance and understanding.  Pneumococcal vaccine status: Up to date  Covid-19 vaccine status: Completed vaccines  Qualifies for Shingles Vaccine? Yes   Zostavax completed Yes   Shingrix Completed?: No.    Education has been provided regarding the importance of this vaccine. Patient has been advised to call insurance company to determine out of pocket expense if they have not yet received this vaccine. Advised may also receive vaccine at local pharmacy or Health Dept. Verbalized acceptance and understanding.  Screening Tests Health Maintenance  Topic Date Due   Zoster Vaccines- Shingrix (1 of 2) Never done   COLONOSCOPY (Pts 45-173yrs Insurance coverage will need to be confirmed)  02/15/2019   COVID-19 Vaccine (5 - Booster for Pfizer series) 01/26/2021   INFLUENZA VACCINE  01/19/2021  TETANUS/TDAP  06/30/2021 (Originally 02/19/2017)   Hepatitis C Screening  06/30/2021 (Originally 02/18/1969)   MAMMOGRAM  06/09/2022   DEXA SCAN  Completed   PNA vac Low Risk Adult  Completed   HPV VACCINES  Aged Out    Health Maintenance  Health Maintenance Due  Topic Date Due   Zoster Vaccines- Shingrix (1 of 2) Never done   COLONOSCOPY (Pts 45-62yrs Insurance coverage will need to be confirmed)  02/15/2019   COVID-19 Vaccine (5 - Booster for Pfizer series) 01/26/2021   INFLUENZA VACCINE  01/19/2021    Cologuard stated by patient done last year  Mammogram status: Completed 06/09/20. Repeat every year  Bone Density status: Completed 06/09/20. Results reflect: Bone density results: OSTEOPOROSIS. Repeat every 2 years.   Additional Screening:  Hepatitis C Screening: does qualify;  Vision Screening: Recommended annual ophthalmology exams for early detection of glaucoma and other disorders of the eye. Is the patient up to date with their annual eye exam?  Yes  Who is the provider or what is the name of the office in which the patient attends annual eye exams? Dr Corky Mull If pt is not established with a provider, would they like to be referred to a provider to establish care? No .   Dental Screening: Recommended annual dental exams for proper oral hygiene  Community Resource Referral / Chronic Care Management: CRR required this visit?  No   CCM required this visit?  No      Plan:     I have personally reviewed and noted the following in the patient's chart:   Medical and social history Use of alcohol, tobacco or illicit drugs  Current medications and supplements including opioid prescriptions.  Functional ability and status Nutritional status Physical activity Advanced directives List of other physicians Hospitalizations, surgeries, and ER visits in previous 12 months Vitals Screenings to include cognitive, depression, and falls Referrals and appointments  In  addition, I have reviewed and discussed with patient certain preventive protocols, quality metrics, and best practice recommendations. A written personalized care plan for preventive services as well as general preventive health recommendations were provided to patient.     Marzella Schlein, LPN   [id number]   Nurse Notes: Pt stated she she had an accident of loose stool during diarrhea episode twice in the last year and wanted to know if that is a concern, please advise

## 2021-05-22 DIAGNOSIS — D869 Sarcoidosis, unspecified: Secondary | ICD-10-CM | POA: Diagnosis not present

## 2021-05-22 DIAGNOSIS — Z5181 Encounter for therapeutic drug level monitoring: Secondary | ICD-10-CM | POA: Diagnosis not present

## 2021-05-22 DIAGNOSIS — Z7960 Long term (current) use of unspecified immunomodulators and immunosuppressants: Secondary | ICD-10-CM | POA: Diagnosis not present

## 2021-06-12 DIAGNOSIS — Z8249 Family history of ischemic heart disease and other diseases of the circulatory system: Secondary | ICD-10-CM | POA: Diagnosis not present

## 2021-06-12 DIAGNOSIS — Z823 Family history of stroke: Secondary | ICD-10-CM | POA: Diagnosis not present

## 2021-06-12 DIAGNOSIS — D869 Sarcoidosis, unspecified: Secondary | ICD-10-CM | POA: Diagnosis not present

## 2021-06-12 DIAGNOSIS — Z880 Allergy status to penicillin: Secondary | ICD-10-CM | POA: Diagnosis not present

## 2021-06-12 DIAGNOSIS — Z79899 Other long term (current) drug therapy: Secondary | ICD-10-CM | POA: Diagnosis not present

## 2021-07-13 NOTE — Progress Notes (Signed)
Chief Complaint  Patient presents with   Annual Exam    fasting    HPI: Patient  Courtney NovelBrenda C Bray  71 y.o. comes in today for Preventive Health Care visit  Pumonary  stable  pulmonary no imitations currently  Osteoporosis  endo  suggested med   read book about  alkaline   and exercise  osteo strong .  Mindful.  Not ready for meds at this time  risk benefoit  taking 5000 vit d per day and last lab was ok  Health Maintenance  Topic Date Due   Hepatitis C Screening  Never done   TETANUS/TDAP  02/19/2017   INFLUENZA VACCINE  01/19/2021   COLONOSCOPY (Pts 45-6639yrs Insurance coverage will need to be confirmed)  01/11/2022 (Originally 02/15/2019)   Zoster Vaccines- Shingrix (1 of 2) 01/11/2022 (Originally 02/18/1970)   COVID-19 Vaccine (5 - Booster for Pfizer series) 11/12/2026 (Originally 11/21/2020)   MAMMOGRAM  06/09/2022   Pneumonia Vaccine 5265+ Years old  Completed   DEXA SCAN  Completed   HPV VACCINES  Aged Out   Health Maintenance Review LIFESTYLE:  Exercise:  walks  and reobunder  Tobacco/ETS: n Alcohol: ocass  Sugar beverages: no Sleep: 7 hours  Drug use: no HH of  2  1 pet dog  14 yeasr Work:4 hours  jehovas witness     ROS:  REST of 12 system review negative except as per HPI   Past Medical History:  Diagnosis Date   Chicken pox    Lymphocytosis    Osteoporosis    Sarcoidosis 02/24/2007   Dxed 1994 by TBBX + ISLD/LN on cxr Primarily manifests as cough and skin changes Tx with Humira [facility] derm [facility].  PFT's 2010:  No obstruction, TLC 3.93 (87%), DLCO 89%. PFT's 2014:  No obstruction, TLC 3.07, DLCO 91%      Past Surgical History:  Procedure Laterality Date   TUBAL LIGATION  1979   UTERINE ARTERY EMBOLIZATION  2000    Family History  Problem Relation Age of Onset   Heart disease Father    Stroke Father    Dementia Father    High blood pressure Father    Dementia Mother    High blood pressure Mother    High blood pressure Sister        with hypertesnive  renal disease    Social History   Socioeconomic History   Marital status: Married    Spouse name: Not on file   Number of children: 4   Years of education: Not on file   Highest education level: Not on file  Occupational History   Occupation: Self-employed  Tobacco Use   Smoking status: Never   Smokeless tobacco: Never  Vaping Use   Vaping Use: Never used  Substance and Sexual Activity   Alcohol use: Yes    Alcohol/week: 0.0 standard drinks    Comment: 1 drink of wine 1-2 times a week   Drug use: No   Sexual activity: Yes    Partners: Male  Other Topics Concern   Not on file  Social History Narrative   5-6 hours of sleep per night   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  Orig from [place]   in [place] since 1988   [name]    Social Determinants of Health   Financial Resource Strain: Low Risk    Difficulty of Paying Living Expenses: Not hard at all  Food Insecurity: No Food Insecurity   Worried About Programme researcher, broadcasting/film/video in the Last Year: Never true   Barista in the Last Year: Never true  Transportation Needs: No Transportation Needs   Lack of Transportation (Medical): No   Lack of Transportation (Non-Medical): No  Physical Activity: Sufficiently Active   Days of Exercise per Week: 5 days   Minutes of Exercise per Session: 40 min  Stress: No Stress Concern Present   Feeling of Stress : Not at all  Social Connections: Moderately Integrated   Frequency of Communication with Friends and Family: More than three times a week   Frequency of Social Gatherings with Friends and Family: More than three times a week   Attends Religious Services: More than 4 times per year   Active Member of [employer] or Organizations: No   Attends Banker  Meetings: Never   Marital Status: Married    Outpatient Medications Prior to Visit  Medication Sig Dispense Refill   clobetasol cream (TEMOVATE) 0.05 % Apply topically as needed.     HUMIRA PEN 40 MG/0.8ML PNKT Inject every other week for sarcoidosis     Multiple Vitamin (MULTIVITAMIN) tablet Take 1 tablet by mouth daily.     VITAMIN D PO Take 5,000 Units by mouth daily.     No facility-administered medications prior to visit.     EXAM:  BP 136/80 (BP Location: Left Arm, Patient Position: Sitting, Cuff Size: Normal)    Pulse 78    Temp 98.5 F (36.9 C) (Oral)    Ht 5' 1.5" (1.562 m)    Wt 110 lb 9.6 oz (50.2 kg)    SpO2 99%    BMI 20.56 kg/m   Body mass index is 20.56 kg/m. Wt Readings from Last 3 Encounters:  07/14/21 110 lb 9.6 oz (50.2 kg)  02/03/21 114 lb 12.8 oz (52.1 kg)  01/05/21 115 lb 4.8 oz (52.3 kg)    Physical Exam: Vital signs reviewed YOV:ZCHY is a well-developed well-nourished alert cooperative    who appearsr stated age in no acute distress.  HEENT: normocephalic atraumatic , Eyes: PERRL EOM's full, conjunctiva clear, Nares: paten,t no deformity discharge or tenderness., Ears: no deformity EAC's clear TMs with normal landmarks. Mouth masked NECK: supple without masses, thyromegaly or bruits. CHEST/PULM:  Clear to auscultation and percussion breath sounds equal no wheeze , rales or rhonchi. No chest wall deformities or tenderness. Breast: normal by inspection . Inverted nipples ( no change) No dimpling, discharge, masses, tenderness or discharge . CV: PMI is nondisplaced, S1 S2 no gallops, murmurs, rubs. Peripheral pulses are full without delay.No JVD .  ABDOMEN: Bowel sounds normal nontender  No guard or rebound, no hepato splenomegal no CVA tenderness.   Extremtities:  No clubbing cyanosis or edema, no acute joint swelling or redness no focal atrophy NEURO:  Oriented x3, cranial nerves 3-12 appear to be intact, no obvious focal weakness,gait within normal limits  no abnormal reflexes or asymmetrical SKIN: No acute rashes normal turgor, color, no bruising or petechiae. PSYCH: Oriented, good eye contact, no obvious depression anxiety, cognition and judgment appear normal. LN: no cervical axillary inguinal adenopathy  Lab Results  Component Value Date   WBC 4.1 06/30/2020   HGB  14.7 06/30/2020   HCT 42.9 06/30/2020   PLT 290.0 06/30/2020   GLUCOSE 79 08/20/2020   CHOL 203 (H) 06/30/2020   TRIG 72.0 06/30/2020   HDL 67.20 06/30/2020   LDLCALC 121 (H) 06/30/2020   ALT 21 11/03/2020   AST 19 11/03/2020   NA 139 08/20/2020   K 4.4 08/20/2020   CL 104 08/20/2020   CREATININE 0.65 08/20/2020   BUN 15 08/20/2020   CO2 26 08/20/2020   TSH 1.37 06/30/2020    BP Readings from Last 3 Encounters:  07/14/21 136/80  02/03/21 124/72  01/05/21 134/80    Lab plan  reviewed with patient   ASSESSMENT AND PLAN:  Discussed the following assessment and plan:    ICD-10-CM   1. Visit for preventive health examination  Z00.00     2. Sarcoidosis  D86.9 Basic metabolic panel    CBC with Differential/Platelet    Hepatic function panel    Lipid panel    TSH    VITAMIN D 25 Hydroxy (Vit-D Deficiency, Fractures)    TSH    Lipid panel    Hepatic function panel    CBC with Differential/Platelet    Basic metabolic panel    3. Elevated LDL cholesterol level  E78.00 Basic metabolic panel    CBC with Differential/Platelet    Hepatic function panel    Lipid panel    TSH    VITAMIN D 25 Hydroxy (Vit-D Deficiency, Fractures)    TSH    Lipid panel    Hepatic function panel    CBC with Differential/Platelet    Basic metabolic panel    4. Osteoporosis without current pathological fracture, unspecified osteoporosis type  M81.0 Basic metabolic panel    CBC with Differential/Platelet    Hepatic function panel    Lipid panel    TSH    VITAMIN D 25 Hydroxy (Vit-D Deficiency, Fractures)    TSH    Lipid panel    Hepatic function panel    CBC with  Differential/Platelet    Basic metabolic panel   mindfullness and intervnetion  lsi vit d  has fu Dr Reece Agar this year     5. Sarcoidosis, lung (HCC)  D86.0 Basic metabolic panel    CBC with Differential/Platelet    Hepatic function panel    Lipid panel    TSH    VITAMIN D 25 Hydroxy (Vit-D Deficiency, Fractures)    TSH    Lipid panel    Hepatic function panel    CBC with Differential/Platelet    Basic metabolic panel    6. Lymphocytosis  D72.820 Basic metabolic panel    CBC with Differential/Platelet    Hepatic function panel    Lipid panel    TSH    VITAMIN D 25 Hydroxy (Vit-D Deficiency, Fractures)    TSH    Lipid panel    Hepatic function panel    CBC with Differential/Platelet    Basic metabolic panel     No follow-ups on file.  Patient Care Team: Wyat Infinger, Neta Mends, MD as PCP - General (Internal Medicine) Renaldo Reel Georgeanna Harrison, MD as Referring Physician (Dermatology) Tomma Lightning, MD as Consulting Physician (Pulmonary Disease) Patient Instructions  Good to see you today . Continue lifestyle intervention healthy eating and exercise .   Will notify you  of labs when available.   If all ok then yearly check   Neta Mends. Vernel Langenderfer M.D.

## 2021-07-14 ENCOUNTER — Encounter: Payer: Self-pay | Admitting: Internal Medicine

## 2021-07-14 ENCOUNTER — Ambulatory Visit (INDEPENDENT_AMBULATORY_CARE_PROVIDER_SITE_OTHER): Payer: Medicare HMO | Admitting: Internal Medicine

## 2021-07-14 VITALS — BP 136/80 | HR 78 | Temp 98.5°F | Ht 61.5 in | Wt 110.6 lb

## 2021-07-14 DIAGNOSIS — Z0000 Encounter for general adult medical examination without abnormal findings: Secondary | ICD-10-CM | POA: Diagnosis not present

## 2021-07-14 DIAGNOSIS — E7800 Pure hypercholesterolemia, unspecified: Secondary | ICD-10-CM

## 2021-07-14 DIAGNOSIS — D869 Sarcoidosis, unspecified: Secondary | ICD-10-CM | POA: Diagnosis not present

## 2021-07-14 DIAGNOSIS — D860 Sarcoidosis of lung: Secondary | ICD-10-CM

## 2021-07-14 DIAGNOSIS — D729 Disorder of white blood cells, unspecified: Secondary | ICD-10-CM

## 2021-07-14 DIAGNOSIS — D72820 Lymphocytosis (symptomatic): Secondary | ICD-10-CM | POA: Diagnosis not present

## 2021-07-14 DIAGNOSIS — M810 Age-related osteoporosis without current pathological fracture: Secondary | ICD-10-CM

## 2021-07-14 LAB — CBC WITH DIFFERENTIAL/PLATELET
Basophils Absolute: 0 10*3/uL (ref 0.0–0.1)
Basophils Relative: 0.6 % (ref 0.0–3.0)
Eosinophils Absolute: 0.2 10*3/uL (ref 0.0–0.7)
Eosinophils Relative: 4.8 % (ref 0.0–5.0)
HCT: 42.7 % (ref 36.0–46.0)
Hemoglobin: 14.3 g/dL (ref 12.0–15.0)
Lymphocytes Relative: 59.4 % — ABNORMAL HIGH (ref 12.0–46.0)
Lymphs Abs: 2.3 10*3/uL (ref 0.7–4.0)
MCHC: 33.6 g/dL (ref 30.0–36.0)
MCV: 94.5 fl (ref 78.0–100.0)
Monocytes Absolute: 0.4 10*3/uL (ref 0.1–1.0)
Monocytes Relative: 9.3 % (ref 3.0–12.0)
Neutro Abs: 1 10*3/uL — ABNORMAL LOW (ref 1.4–7.7)
Neutrophils Relative %: 25.9 % — ABNORMAL LOW (ref 43.0–77.0)
Platelets: 256 10*3/uL (ref 150.0–400.0)
RBC: 4.51 Mil/uL (ref 3.87–5.11)
RDW: 13.1 % (ref 11.5–15.5)
WBC: 3.9 10*3/uL — ABNORMAL LOW (ref 4.0–10.5)

## 2021-07-14 LAB — HEPATIC FUNCTION PANEL
ALT: 24 U/L (ref 0–35)
AST: 27 U/L (ref 0–37)
Albumin: 4.1 g/dL (ref 3.5–5.2)
Alkaline Phosphatase: 91 U/L (ref 39–117)
Bilirubin, Direct: 0.1 mg/dL (ref 0.0–0.3)
Total Bilirubin: 0.6 mg/dL (ref 0.2–1.2)
Total Protein: 7.6 g/dL (ref 6.0–8.3)

## 2021-07-14 LAB — BASIC METABOLIC PANEL
BUN: 8 mg/dL (ref 6–23)
CO2: 30 mEq/L (ref 19–32)
Calcium: 9.4 mg/dL (ref 8.4–10.5)
Chloride: 102 mEq/L (ref 96–112)
Creatinine, Ser: 0.69 mg/dL (ref 0.40–1.20)
GFR: 87.94 mL/min (ref >60.00)
Glucose, Bld: 88 mg/dL (ref 70–99)
Potassium: 4.3 mEq/L (ref 3.5–5.1)
Sodium: 138 mEq/L (ref 135–145)

## 2021-07-14 LAB — LIPID PANEL
Cholesterol: 177 mg/dL (ref 0–200)
HDL: 68.8 mg/dL (ref >39.00)
LDL Cholesterol: 93 mg/dL (ref 0–99)
NonHDL: 107.8
Total CHOL/HDL Ratio: 3
Triglycerides: 74 mg/dL (ref 0.0–149.0)
VLDL: 14.8 mg/dL (ref 0.0–40.0)

## 2021-07-14 LAB — TSH: TSH: 1.47 u[IU]/mL (ref 0.35–5.50)

## 2021-07-14 NOTE — Patient Instructions (Signed)
Good to see you today . Continue lifestyle intervention healthy eating and exercise .   Will notify you  of labs when available.   If all ok then yearly check

## 2021-07-26 NOTE — Progress Notes (Signed)
Blood shows nl cholesterol and  blood sugar and liver .kidney . Blood count still  borderline low blood count and  type of WBC out of range . May be normal for you but  should be followed .  I would like to repeat cbcdiff in 4-6 months to follow . ( No visit needed if feeling well)  Please place future order cbc diff  dx lymphocytosis  and make lab appt

## 2021-07-27 NOTE — Addendum Note (Signed)
Addended by: Christy Sartorius on: 07/27/2021 04:18 PM   Modules accepted: Orders

## 2021-08-20 ENCOUNTER — Ambulatory Visit: Payer: Medicare HMO | Admitting: Internal Medicine

## 2021-09-18 ENCOUNTER — Ambulatory Visit: Payer: Medicare HMO | Admitting: Internal Medicine

## 2021-09-18 ENCOUNTER — Encounter: Payer: Self-pay | Admitting: Internal Medicine

## 2021-09-18 VITALS — BP 130/90 | HR 75 | Ht 61.5 in | Wt 114.8 lb

## 2021-09-18 DIAGNOSIS — E559 Vitamin D deficiency, unspecified: Secondary | ICD-10-CM | POA: Diagnosis not present

## 2021-09-18 DIAGNOSIS — M810 Age-related osteoporosis without current pathological fracture: Secondary | ICD-10-CM

## 2021-09-18 LAB — VITAMIN D 25 HYDROXY (VIT D DEFICIENCY, FRACTURES): VITD: 54.29 ng/mL (ref 30.00–100.00)

## 2021-09-18 NOTE — Progress Notes (Signed)
Patient ID: Courtney Bray, female   DOB: Oct 07, 1950, 71 y.o.   MRN: [id number]  ? ?This visit occurred during the SARS-CoV-2 public health emergency.  Safety protocols were in place, including screening questions prior to the visit, additional usage of staff PPE, and extensive cleaning of exam room while observing appropriate contact time as indicated for disinfecting solutions.  ? ?HPI  ?Courtney Bray is a 71 y.o.-year-old female, initially referred by her PCP, Dr. Fabian Sharp, for management of osteoporosis (OP).  Last visit 1 year ago. ? ?Interim history: ?No falls or fractures since last visit.  No dizziness/orthostasis/poor vision. ?Since last visit, she switched to a mostly plant-based diet. ? ?Reviewed history: ?Pt was dx with OP in 2011. ? ?I reviewed pt's DXA scan reports along with the pt: ?Date L1-L4 T score FN T score 33% distal Radius  [facility]  -3.8 RFN: -2.8 ?LFN: -2.9 n/a  ?05/27/2015 ([facility])  -3.8 RFN: -2.6 ?LFN: -2.6 n/a  ?05/01/2010 [facility])  -3.7 RFN: -2.5 ?LFN: -2.7 n/a  ? ?No Previous OP treatments. ? ?At last visit, we discussed about Prolia and the PA was approved, with a co-pay.  However, she declined pharmaceutical interventions afterwards ? ?She went to [facility] for 6 mo before the Coronavs. pandemic in 08/2018. ? ?She has a h/o vitamin D deficiency. Reviewed available vit D levels: ?Lab Results  ?Component Value Date  ? VD25OH 41.2 08/20/2020  ? VD25OH 45 02/07/2020  ? VD25OH 14.83 (L) 06/26/2019  ? VD25OH 21.17 (L) 04/08/2017  ? ?Pt is on: ?- vitamin D 5000 units daily  ? ?No weight bearing exercises. She walks up to 2 miles a day, but was not very consistent lately. ? ?She does not take high vitamin A doses. ? ?She has been on steroids in the past >> occasionally inj's and tapers, but not for sarcoidosis. ? ?She was on PPIs in the past for 1 year >> stopped. ? ?Menopause was at ~71 y/o.  ? ?FH of osteoporosis: mother in her 55s (pelvic fx. And hip  fx.) ? ?No h/o hyper/hypocalcemia or hyperparathyroidism. No h/o kidney stones. ?Lab Results  ?Component Value Date  ? PTH 24 06/30/2020  ? CALCIUM 9.4 07/14/2021  ? CALCIUM 9.1 08/20/2020  ? CALCIUM 9.4 06/30/2020  ? CALCIUM 9.3 06/26/2019  ? CALCIUM 8.8 05/30/2018  ? CALCIUM 9.4 04/08/2017  ? CALCIUM 9.2 03/26/2016  ? CALCIUM 9.1 11/10/2015  ? CALCIUM 9.3 03/25/2015  ? ?No h/o thyrotoxicosis. Reviewed TSH recent levels:  ?Lab Results  ?Component Value Date  ? TSH 1.47 07/14/2021  ? TSH 1.37 06/30/2020  ? TSH 1.53 03/26/2016  ? TSH 1.35 03/25/2015  ? ?No h/o CKD. Last BUN/Cr: ?Lab Results  ?Component Value Date  ? BUN 8 07/14/2021  ? CREATININE 0.69 07/14/2021  ? 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  She sees Dr. Renaldo Reel at [facility] dermatology ?She has a low white blood cell count. ? ?ROS: ?+ see HPI ?+ joint aches (left hip) ? ?I reviewed pt's medications, allergies, PMH, social hx, family hx, and changes were documented in the history of present illness. Otherwise, unchanged from my initial visit note. ? ?Past Medical History:  ?Diagnosis Date  ? Chicken pox   ? Lymphocytosis   ? Osteoporosis   ? Sarcoidosis 02/24/2007  ? 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  PFT's 2010:  No obstruction, TLC 3.93 (87%), DLCO 89%. PFT's 2014:  No obstruction, TLC 3.07, DLCO 91%    ? ?Past Surgical History:  ?Procedure Laterality Date  ? TUBAL LIGATION  1979  ? UTERINE ARTERY EMBOLIZATION  2000  ? ?Social History  ? ?Socioeconomic History  ? Marital status: Married  ?  Spouse name: Not on file  ? Number of children: 4  ? Years of education: Not on file  ? Highest education level: Not on file  ?Occupational History  ? Occupation: Self-employed  ?Tobacco Use  ? Smoking status: Never  ? Smokeless tobacco: Never  ?Vaping Use  ? Vaping Use: Never used  ?Substance and Sexual Activity  ? Alcohol use: Yes  ?  Alcohol/week: 0.0  standard drinks  ?  Comment: 1 drink of wine 1-2 times a week  ? Drug use: No  ? Sexual activity: Yes  ?  Partners: Male  ?Other Topics Concern  ? Not on file  ?Social History Narrative  ? 5-6 hours of sleep per night  ? Lives with her husband who is retired Engineer, agricultural business  ? Has 4 children and all have moved out  ? Does not work outside the home. Real estate self employed  Now retired ?  ? Takes care of her mother who had dementia 3-4 hours daily  ? Has one dog in the home.  ? Orig from [place]   in [place] since 1988  ? [name]   ? ?Social Determinants of Health  ? ?Financial Resource Strain: Low Risk   ? Difficulty of Paying Living Expenses: Not hard at all  ?Food Insecurity: No Food Insecurity  ? Worried About Programme researcher, broadcasting/film/video in the Last Year: Never true  ? Ran Out of Food in the Last Year: Never true  ?Transportation Needs: No Transportation Needs  ? Lack of Transportation (Medical): No  ? Lack of Transportation (Non-Medical): No  ?Physical Activity: Sufficiently Active  ? Days of Exercise per Week: 5 days  ? Minutes of Exercise per Session: 40 min  ?Stress: No Stress Concern Present  ? Feeling of Stress : Not at all  ?Social Connections: Moderately Integrated  ? Frequency of Communication with Friends and Family: More than three times a week  ? Frequency of Social Gatherings with Friends and Family: More than three times a week  ? Attends Religious Services: More than 4 times per year  ? Active Member of Clubs or Organizations: No  ? Attends Banker Meetings: Never  ? Marital Status: Married  ?Intimate Partner Violence: Not At Risk  ? Fear of Current or Ex-Partner: No  ? Emotionally Abused: No  ? Physically Abused: No  ? Sexually Abused: No  ? ?Current Outpatient Medications on File Prior to Visit  ?Medication Sig Dispense Refill  ? clobetasol cream (TEMOVATE) 0.05 % Apply topically as needed.    ? HUMIRA PEN 40 MG/0.8ML PNKT Inject every other week for sarcoidosis    ? Multiple Vitamin  (MULTIVITAMIN) tablet Take 1 tablet by mouth daily.    ? VITAMIN D PO Take 5,000 Units by mouth daily.    ? ?No current facility-administered medications on file prior to visit.  ? ?Allergies  ?Allergen Reactions  ? Amoxicillin Rash  ? Vitamin D Analogs Rash  ?  high dose  Pill had rash   ? ?Family History  ?Problem Relation Age of Onset  ? Heart disease Father   ? Stroke Father   ? Dementia Father   ?  High blood pressure Father   ? Dementia Mother   ? High blood pressure Mother   ? High blood pressure Sister   ?     with hypertesnive renal disease  ? ?PE: ?BP 130/90 (BP Location: Right Arm, Patient Position: Sitting, Cuff Size: Normal)   Pulse 75   Ht 5' 1.5" (1.562 m)   Wt 114 lb 12.8 oz (52.1 kg)   SpO2 98%   BMI 21.34 kg/m?  ?Wt Readings from Last 3 Encounters:  ?09/18/21 114 lb 12.8 oz (52.1 kg)  ?07/14/21 110 lb 9.6 oz (50.2 kg)  ?02/03/21 114 lb 12.8 oz (52.1 kg)  ? ?Constitutional: Normal weight, in NAD. No kyphosis. ?Eyes: PERRLA, EOMI, no exophthalmos ?ENT: moist mucous membranes, no thyromegaly, no cervical lymphadenopathy ?Cardiovascular: RRR, No MRG ?Respiratory: CTA B ?Musculoskeletal: no deformities, strength intact in all 4.  ?Skin: moist, warm, no rashes ?Neurological: no tremor with outstretched hands, DTR normal in all 4 ? ?Assessment: ?1. Osteoporosis ? ?2.  Vitamin D deficiency ? ?Plan: ?1. Osteoporosis ?-Likely postmenopausal/age-related, she also has FH of OP ?-we reviewed together her previous 3 DXA scan report and her T-scores.  I explained that she is at a higher risk for fractures due to the low T-scores, and her -2.5.  Also, T-scores decreased from 2011-2021, but they are not completely comparable since each of the 3 scans were done on different machines.  We decided that going forward, we need to [facility]  She will be due for another scan after 06/09/2022. ?-at last visit and again today, we reviewed her calcium and vitamin D intake. She  appears to get >1000 milligrams of calcium preferentially from the diet and she continues on 5000 units vitamin D daily.  On this dose, at last visit, vitamin D level was normal.   ?-Again discussed fall precautions. ?-She is

## 2021-09-18 NOTE — Patient Instructions (Addendum)
Please stop at the lab. ? ?Please continue vitamin D 5000 units daily. ? ?Please try to get ~1200 mg calcium, preferentially from the diet. ? ?Exercise for Strong Bones (from [place]) ?There are two types of exercises that are important for building and maintaining bone density:  weight-bearing and muscle-strengthening exercises. ?Weight-bearing Exercises ?These exercises include activities that make you move against gravity while staying upright. Weight-bearing exercises can be high-impact or low-impact. ?High-impact weight-bearing exercises help build bones and keep them strong. If you have broken a bone due to osteoporosis or are at risk of breaking a bone, you may need to avoid high-impact exercises. If you?re not sure, you should check with your healthcare provider. ?Examples of high-impact weight-bearing exercises are: ?Dancing ?Doing high-impact aerobics ?Hiking ?Jogging/running ?Jumping Rope ?Stair climbing ?Tennis ?Low-impact weight-bearing exercises can also help keep bones strong and are a safe alternative if you cannot do high-impact exercises. Examples of low-impact weight-bearing exercises are: ?Using elliptical training machines ?Doing low-impact aerobics ?Using stair-step machines ?Fast walking on a treadmill or outside ?Muscle-Strengthening Exercises ?These exercises include activities where you move your body, a weight or some other resistance against gravity. They are also known as resistance exercises and include: ?Lifting weights ?Using elastic exercise bands ?Using weight machines ?Lifting your own body weight ?Functional movements, such as standing and rising up on your toes ?Yoga and Pilates can also improve strength, balance and flexibility. However, certain positions may not be safe for people with osteoporosis or those at increased risk of broken bones. For example, exercises that have you bend forward may increase the chance of breaking a bone in the spine. A  physical therapist should be able to help you learn which exercises are safe and appropriate for you. ?Non-Impact Exercises ?Non-impact exercises can help you to improve balance, posture and how well you move in everyday activities. These exercises can also help to increase muscle strength and decrease the risk of falls and broken bones. Some of these exercises include: ?Balance exercises that strengthen your legs and test your balance, such as Tai Chi, can decrease your risk of falls. ?Posture exercises that improve your posture and reduce rounded or ?sloping? shoulders can help you decrease the chance of breaking a bone, especially in the spine. ?Functional exercises that improve how well you move can help you with everyday activities and decrease your chance of falling and breaking a bone. For example, if you have trouble getting up from a chair or climbing stairs, you should do these activities as exercises. ?A physical therapist can teach you balance, posture and functional exercises. ?Starting a New Exercise Program ?If you haven?t exercised regularly for a while, check with your healthcare provider before beginning a new exercise program--particularly if you have health problems such as heart disease, diabetes or high blood pressure. If you?re at high risk of breaking a bone, you should work with a physical therapist to develop a safe exercise program. ?Once you have your healthcare provider?s approval, start slowly. If you?ve already broken bones in the spine because of osteoporosis, be very careful to avoid activities that require reaching down, bending forward, rapid twisting motions, heavy lifting and those that increase your chance of a fall. ?As you get started, your muscles may feel sore for a day or two after you exercise. If soreness lasts longer, you may be working too hard and need to ease up. Exercises should be done in a pain-free range of motion. ?How Much Exercise Do You Need? ?Weight-bearing  exercises 30 minutes on most days of the week. Do a 30-minutesession or multiple sessions spread out throughout the day. The benefits to your bones are the same.   ?Muscle-strengthening exercises Two to three days per week. If you don?t have much time for strengthening/resistance training, do small amounts at a time. You can do just one body part each day. For example do arms one day, legs the next and trunk the next. You can also spread these exercises out during your normal day.  ?Balance, posture and functional exercises Every day or as often as needed. You may want to focus on one area more than the others. If you have fallen or lose your balance, spend time doing balance exercises. If you are getting rounded shoulders, work more on posture exercises. If you have trouble climbing stairs or getting up from the couch, do more functional exercises. You can also perform these exercises at one time or spread them during your day. Work with a phyiscal therapist to learn the right exercises for you.  ? ? ?Please return for another visit in 1 year. ?

## 2021-11-10 ENCOUNTER — Telehealth: Payer: Self-pay | Admitting: Internal Medicine

## 2021-11-10 NOTE — Telephone Encounter (Signed)
Last Ov 07/14/21 Please advise

## 2021-11-10 NOTE — Telephone Encounter (Signed)
Shante with [facility] is calling and pt would like cologuard kit

## 2021-11-11 NOTE — Telephone Encounter (Signed)
Ok to order cologuard

## 2021-11-12 ENCOUNTER — Other Ambulatory Visit: Payer: Self-pay

## 2021-11-12 DIAGNOSIS — Z0000 Encounter for general adult medical examination without abnormal findings: Secondary | ICD-10-CM

## 2021-11-12 NOTE — Telephone Encounter (Signed)
Cologuard order placed.

## 2021-11-23 ENCOUNTER — Other Ambulatory Visit (INDEPENDENT_AMBULATORY_CARE_PROVIDER_SITE_OTHER): Payer: Medicare HMO

## 2021-11-23 DIAGNOSIS — D729 Disorder of white blood cells, unspecified: Secondary | ICD-10-CM

## 2021-11-23 LAB — CBC WITH DIFFERENTIAL/PLATELET
Basophils Absolute: 0 10*3/uL (ref 0.0–0.1)
Basophils Relative: 0.9 % (ref 0.0–3.0)
Eosinophils Absolute: 0.2 10*3/uL (ref 0.0–0.7)
Eosinophils Relative: 5.9 % — ABNORMAL HIGH (ref 0.0–5.0)
HCT: 39.8 % (ref 36.0–46.0)
Hemoglobin: 13.5 g/dL (ref 12.0–15.0)
Lymphocytes Relative: 60.5 % — ABNORMAL HIGH (ref 12.0–46.0)
Lymphs Abs: 2.2 10*3/uL (ref 0.7–4.0)
MCHC: 34 g/dL (ref 30.0–36.0)
MCV: 94.3 fl (ref 78.0–100.0)
Monocytes Absolute: 0.3 10*3/uL (ref 0.1–1.0)
Monocytes Relative: 9.6 % (ref 3.0–12.0)
Neutro Abs: 0.8 10*3/uL — ABNORMAL LOW (ref 1.4–7.7)
Neutrophils Relative %: 23.1 % — ABNORMAL LOW (ref 43.0–77.0)
Platelets: 241 10*3/uL (ref 150.0–400.0)
RBC: 4.22 Mil/uL (ref 3.87–5.11)
RDW: 12.7 % (ref 11.5–15.5)
WBC: 3.6 10*3/uL — ABNORMAL LOW (ref 4.0–10.5)

## 2021-12-02 NOTE — Progress Notes (Signed)
Blood count about the same  . We can Consider  have hematology  see you again for  another look , to make sure all is ok . Your last hematology check was 2017 . ( If   you feel sick, unexplained  fevers night sweats weight loss  need medical evaluation)  Otherwise   Do another cbc diff in 6 months  dx leukopenia  Let me know if you want to see hematology  update again.

## 2021-12-03 ENCOUNTER — Other Ambulatory Visit: Payer: Self-pay

## 2021-12-03 DIAGNOSIS — D72819 Decreased white blood cell count, unspecified: Secondary | ICD-10-CM

## 2021-12-08 ENCOUNTER — Other Ambulatory Visit: Payer: Self-pay

## 2021-12-08 DIAGNOSIS — D72819 Decreased white blood cell count, unspecified: Secondary | ICD-10-CM

## 2021-12-08 NOTE — Progress Notes (Signed)
cc

## 2022-01-12 ENCOUNTER — Other Ambulatory Visit: Payer: Medicare HMO

## 2022-01-26 ENCOUNTER — Telehealth: Payer: Self-pay

## 2022-01-26 NOTE — Telephone Encounter (Signed)
Caller states she started with sinus congestion yesterday, no cough.  Home Care

## 2022-01-28 ENCOUNTER — Other Ambulatory Visit: Payer: Medicare HMO

## 2022-02-11 ENCOUNTER — Other Ambulatory Visit: Payer: Medicare HMO

## 2022-02-12 ENCOUNTER — Other Ambulatory Visit (INDEPENDENT_AMBULATORY_CARE_PROVIDER_SITE_OTHER): Payer: Medicare HMO

## 2022-02-12 DIAGNOSIS — D72819 Decreased white blood cell count, unspecified: Secondary | ICD-10-CM | POA: Diagnosis not present

## 2022-02-12 LAB — CBC WITH DIFFERENTIAL/PLATELET
Basophils Absolute: 0.1 10*3/uL (ref 0.0–0.1)
Basophils Relative: 1.3 % (ref 0.0–3.0)
Eosinophils Absolute: 0.5 10*3/uL (ref 0.0–0.7)
Eosinophils Relative: 10.5 % — ABNORMAL HIGH (ref 0.0–5.0)
HCT: 41.3 % (ref 36.0–46.0)
Hemoglobin: 13.9 g/dL (ref 12.0–15.0)
Lymphocytes Relative: 58.1 % — ABNORMAL HIGH (ref 12.0–46.0)
Lymphs Abs: 2.6 10*3/uL (ref 0.7–4.0)
MCHC: 33.7 g/dL (ref 30.0–36.0)
MCV: 94.4 fl (ref 78.0–100.0)
Monocytes Absolute: 0.4 10*3/uL (ref 0.1–1.0)
Monocytes Relative: 8.8 % (ref 3.0–12.0)
Neutro Abs: 1 10*3/uL — ABNORMAL LOW (ref 1.4–7.7)
Neutrophils Relative %: 21.3 % — ABNORMAL LOW (ref 43.0–77.0)
Platelets: 281 10*3/uL (ref 150.0–400.0)
RBC: 4.37 Mil/uL (ref 3.87–5.11)
RDW: 12.6 % (ref 11.5–15.5)
WBC: 4.6 10*3/uL (ref 4.0–10.5)

## 2022-02-23 ENCOUNTER — Ambulatory Visit: Payer: Medicare HMO

## 2022-02-28 NOTE — Progress Notes (Signed)
Blood count stable   should follow  [facility] better   Advise plan to monitor  cbcdiff  about every 4-6 months

## 2022-03-05 ENCOUNTER — Other Ambulatory Visit: Payer: Self-pay

## 2022-03-05 DIAGNOSIS — D72819 Decreased white blood cell count, unspecified: Secondary | ICD-10-CM

## 2022-03-23 ENCOUNTER — Ambulatory Visit (INDEPENDENT_AMBULATORY_CARE_PROVIDER_SITE_OTHER): Payer: Medicare HMO

## 2022-03-23 VITALS — BP 112/76 | HR 81 | Temp 97.8°F | Ht 61.0 in | Wt 117.1 lb

## 2022-03-23 DIAGNOSIS — Z1211 Encounter for screening for malignant neoplasm of colon: Secondary | ICD-10-CM

## 2022-03-23 DIAGNOSIS — Z23 Encounter for immunization: Secondary | ICD-10-CM | POA: Diagnosis not present

## 2022-03-23 DIAGNOSIS — Z0000 Encounter for general adult medical examination without abnormal findings: Secondary | ICD-10-CM

## 2022-03-23 NOTE — Patient Instructions (Signed)
Courtney Bray , Thank you for taking time to come for your Medicare Wellness Visit. I appreciate your ongoing commitment to your health goals. Please review the following plan we discussed and let me know if I can assist you in the future.   Screening recommendations/referrals: Colonoscopy: order cologuard today Mammogram: due 05/2022 Bone Density: completed 06/09/2020 Recommended yearly ophthalmology/optometry visit for glaucoma screening and checkup Recommended yearly dental visit for hygiene and checkup  Vaccinations: Influenza vaccine: today Pneumococcal vaccine: completed 10/19/2017 Tdap vaccine: due Shingles vaccine: discussed   Covid-19: 09/26/2020, 02/14/2020, 08/29/2019, 08/04/2019  Advanced directives: copy in chart  Conditions/risks identified: none  Next appointment: Follow up in one year for your annual wellness visit    Preventive Care 8 Years and Older, Female Preventive care refers to lifestyle choices and visits with your health care provider that can promote health and wellness. What does preventive care include? A yearly physical exam. This is also called an annual well check. Dental exams once or twice a year. Routine eye exams. Ask your health care provider how often you should have your eyes checked. Personal lifestyle choices, including: Daily care of your teeth and gums. Regular physical activity. Eating a healthy diet. Avoiding tobacco and drug use. Limiting alcohol use. Practicing safe sex. Taking low-dose aspirin every day. Taking vitamin and mineral supplements as recommended by your health care provider. What happens during an annual well check? The services and screenings done by your health care provider during your annual well check will depend on your age, overall health, lifestyle risk factors, and family history of disease. Counseling  Your health care provider may ask you questions about your: Alcohol use. Tobacco use. Drug use. Emotional  well-being. Home and relationship well-being. Sexual activity. Eating habits. History of falls. Memory and ability to understand (cognition). Work and work Statistician. Reproductive health. Screening  You may have the following tests or measurements: Height, weight, and BMI. Blood pressure. Lipid and cholesterol levels. These may be checked every 5 years, or more frequently if you are over 5 years old. Skin check. Lung cancer screening. You may have this screening every year starting at age 47 if you have a 30-pack-year history of smoking and currently smoke or have quit within the past 15 years. Fecal occult blood test (FOBT) of the stool. You may have this test every year starting at age 24. Flexible sigmoidoscopy or colonoscopy. You may have a sigmoidoscopy every 5 years or a colonoscopy every 10 years starting at age 57. Hepatitis C blood test. Hepatitis B blood test. Sexually transmitted disease (STD) testing. Diabetes screening. This is done by checking your blood sugar (glucose) after you have not eaten for a while (fasting). You may have this done every 1-3 years. Bone density scan. This is done to screen for osteoporosis. You may have this done starting at age 63. Mammogram. This may be done every 1-2 years. Talk to your health care provider about how often you should have regular mammograms. Talk with your health care provider about your test results, treatment options, and if necessary, the need for more tests. Vaccines  Your health care provider may recommend certain vaccines, such as: Influenza vaccine. This is recommended every year. Tetanus, diphtheria, and acellular pertussis (Tdap, Td) vaccine. You may need a Td booster every 10 years. Zoster vaccine. You may need this after age 42. Pneumococcal 13-valent conjugate (PCV13) vaccine. One dose is recommended after age 41. Pneumococcal polysaccharide (PPSV23) vaccine. One dose is recommended after age 3. Talk  to your  health care provider about which screenings and vaccines you need and how often you need them. This information is not intended to replace advice given to you by your health care provider. Make sure you discuss any questions you have with your health care provider. Document Released: 07/04/2015 Document Revised: 02/25/2016 Document Reviewed: 04/08/2015 Elsevier Interactive Patient Education  2017 [place] Prevention in the Home Falls can cause injuries. They can happen to people of all ages. There are many things you can do to make your home safe and to help prevent falls. What can I do on the outside of my home? Regularly fix the edges of walkways and driveways and fix any cracks. Remove anything that might make you trip as you walk through a door, such as a raised step or threshold. Trim any bushes or trees on the path to your home. Use bright outdoor lighting. Clear any walking paths of anything that might make someone trip, such as rocks or tools. Regularly check to see if handrails are loose or broken. Make sure that both sides of any steps have handrails. Any raised decks and porches should have guardrails on the edges. Have any leaves, snow, or ice cleared regularly. Use sand or salt on walking paths during winter. Clean up any spills in your garage right away. This includes oil or grease spills. What can I do in the bathroom? Use night lights. Install grab bars by the toilet and in the tub and shower. Do not use towel bars as grab bars. Use non-skid mats or decals in the tub or shower. If you need to sit down in the shower, use a plastic, non-slip stool. Keep the Bray dry. Clean up any water that spills on the Bray as soon as it happens. Remove soap buildup in the tub or shower regularly. Attach bath mats securely with double-sided non-slip rug tape. Do not have throw rugs and other things on the Bray that can make you trip. What can I do in the bedroom? Use night  lights. Make sure that you have a light by your bed that is easy to reach. Do not use any sheets or blankets that are too big for your bed. They should not hang down onto the Bray. Have a firm chair that has side arms. You can use this for support while you get dressed. Do not have throw rugs and other things on the Bray that can make you trip. What can I do in the kitchen? Clean up any spills right away. Avoid walking on wet floors. Keep items that you use a lot in easy-to-reach places. If you need to reach something above you, use a strong step stool that has a grab bar. Keep electrical cords out of the way. Do not use Bray polish or wax that makes floors slippery. If you must use wax, use non-skid Bray wax. Do not have throw rugs and other things on the Bray that can make you trip. What can I do with my stairs? Do not leave any items on the stairs. Make sure that there are handrails on both sides of the stairs and use them. Fix handrails that are broken or loose. Make sure that handrails are as long as the stairways. Check any carpeting to make sure that it is firmly attached to the stairs. Fix any carpet that is loose or worn. Avoid having throw rugs at the top or bottom of the stairs. If you do have throw rugs,  attach them to the Bray with carpet tape. Make sure that you have a light switch at the top of the stairs and the bottom of the stairs. If you do not have them, ask someone to add them for you. What else can I do to help prevent falls? Wear shoes that: Do not have high heels. Have rubber bottoms. Are comfortable and fit you well. Are closed at the toe. Do not wear sandals. If you use a stepladder: Make sure that it is fully opened. Do not climb a closed stepladder. Make sure that both sides of the stepladder are locked into place. Ask someone to hold it for you, if possible. Clearly mark and make sure that you can see: Any grab bars or handrails. First and last  steps. Where the edge of each step is. Use tools that help you move around (mobility aids) if they are needed. These include: Canes. Walkers. Scooters. Crutches. Turn on the lights when you go into a dark area. Replace any light bulbs as soon as they burn out. Set up your furniture so you have a clear path. Avoid moving your furniture around. If any of your floors are uneven, fix them. If there are any pets around you, be aware of where they are. Review your medicines with your doctor. Some medicines can make you feel dizzy. This can increase your chance of falling. Ask your doctor what other things that you can do to help prevent falls. This information is not intended to replace advice given to you by your health care provider. Make sure you discuss any questions you have with your health care provider. Document Released: 04/03/2009 Document Revised: 11/13/2015 Document Reviewed: 07/12/2014 Elsevier Interactive Patient Education  2017 [employer].

## 2022-03-23 NOTE — Progress Notes (Signed)
Subjective:   Courtney Bray is a 71 y.o. female who presents for Medicare Annual (Subsequent) preventive examination.  Review of Systems     Cardiac Risk Factors include: advanced age (>24men, >62 women)     Objective:    Today's Vitals   03/23/22 1016  BP: 112/76  Pulse: 81  Temp: 97.8 F (36.6 C)  TempSrc: Oral  SpO2: 98%  Weight: 117 lb 1.6 oz (53.1 kg)  Height: 5\' 1"  (1.549 m)   Body mass index is 22.13 kg/m.     03/23/2022   10:24 AM 02/03/2021   11:55 AM 02/11/2020    8:25 AM 10/19/2017   10:43 AM 06/08/2017   10:42 PM 11/19/2015    9:24 AM 07/31/2015    2:22 PM  Advanced Directives  Does Patient Have a Medical Advance Directive? Yes Yes No;Yes Yes Yes No No  Type of Paramedic of [place];Living will Healthcare Power of [place];Living will  [place]    Does patient want to make changes to medical advance directive?   No - Patient declined  No - Patient declined    Copy of [place] in Chart? Yes - validated most recent copy scanned in chart (See row information) No - copy requested No - copy requested  No - copy requested    Would patient like information on creating a medical advance directive?      Yes - Educational materials given Yes - Scientist, clinical (histocompatibility and immunogenetics) given    Current Medications (verified) Outpatient Encounter Medications as of 03/23/2022  Medication Sig   clobetasol cream (TEMOVATE) 0.05 % Apply topically as needed.   HUMIRA PEN 40 MG/0.8ML PNKT Inject every other week for sarcoidosis   VITAMIN D PO Take 5,000 Units by mouth daily.   Multiple Vitamin (MULTIVITAMIN) tablet Take 1 tablet by mouth daily. (Patient not taking: Reported on 03/23/2022)   No facility-administered encounter medications on file as of 03/23/2022.    Allergies (verified) Amoxicillin and Vitamin d analogs   History: Past Medical History:  Diagnosis Date   Chicken pox     Lymphocytosis    Osteoporosis    Sarcoidosis 02/24/2007   Dxed 1994 by TBBX + ISLD/LN on cxr Primarily manifests as cough and skin changes Tx with Humira by derm at [facility].  PFT's 2010:  No obstruction, TLC 3.93 (87%), DLCO 89%. PFT's 2014:  No obstruction, TLC 3.07, DLCO 91%     Past Surgical History:  Procedure Laterality Date   TUBAL LIGATION  1979   UTERINE ARTERY EMBOLIZATION  2000   Family History  Problem Relation Age of Onset   Heart disease Father    Stroke Father    Dementia Father    High blood pressure Father    Dementia Mother    High blood pressure Mother    High blood pressure Sister        with hypertesnive renal disease   Social History   Socioeconomic History   Marital status: Married    Spouse name: Not on file   Number of children: 4   Years of education: Not on file   Highest education level: Not on file  Occupational History   Occupation: Self-employed  Tobacco Use   Smoking status: Never   Smokeless tobacco: Never  Vaping Use   Vaping Use: Never used  Substance and Sexual Activity   Alcohol use: Yes    Alcohol/week: 0.0 standard drinks of alcohol  Comment: 1 drink of wine 1-2 times a week   Drug use: No   Sexual activity: Yes    Partners: Male  Other Topics Concern   Not on file  Social History Narrative   5-6 hours of sleep per night   Lives with her husband who is retired Engineer, 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   Orig from [place]   in [place] since 1988   [name]    Social Determinants of Health   Financial Resource Strain: Low Risk  (03/23/2022)   Overall Financial Resource Strain (CARDIA)    Difficulty of Paying Living Expenses: Not hard at all  Food Insecurity: No Food Insecurity (03/23/2022)   Hunger Vital Sign    Worried About Running Out of Food in the  Last Year: Never true    Ran Out of Food in the Last Year: Never true  Transportation Needs: No Transportation Needs (03/23/2022)   PRAPARE - Administrator, Civil Service (Medical): No    Lack of Transportation (Non-Medical): No  Physical Activity: Sufficiently Active (03/23/2022)   Exercise Vital Sign    Days of Exercise per Week: 5 days    Minutes of Exercise per Session: 50 min  Stress: No Stress Concern Present (03/23/2022)   [employer] of Occupational Health - Occupational Stress Questionnaire    Feeling of Stress : Not at all  Social Connections: Moderately Integrated (02/03/2021)   Social Connection and Isolation Panel [NHANES]    Frequency of Communication with Friends and Family: More than three times a week    Frequency of Social Gatherings with Friends and Family: More than three times a week    Attends Religious Services: More than 4 times per year    Active Member of [employer] or Organizations: No    Attends Banker Meetings: Never    Marital Status: Married    Tobacco Counseling Counseling given: Not Answered   Clinical Intake:  Pre-visit preparation completed: Yes  Pain : No/denies pain     Nutritional Status: BMI of 19-24  Normal Nutritional Risks: None Diabetes: No  How often do you need to have someone help you when you read instructions, pamphlets, or other written materials from your doctor or pharmacy?: 1 - Never What is the last grade level you completed in school?: 12th grade  Diabetic? no  Interpreter Needed?: No  Information entered by :: NAllen LPN   Activities of Daily Living    03/23/2022   10:25 AM  In your present state of health, do you have any difficulty performing the following activities:  Hearing? 0  Vision? 0  Difficulty concentrating or making decisions? 0  Walking or climbing stairs? 0  Dressing or bathing? 0  Doing errands, shopping? 0  Preparing Food and eating ? N  Using the Toilet? N  In  the past six months, have you accidently leaked urine? N  Do you have problems with loss of bowel control? N  Managing your Medications? N  Managing your Finances? N  Housekeeping or managing your Housekeeping? N    Patient Care Team: Panosh, Neta Mends, MD as PCP - General (Internal Medicine) Renaldo Reel Georgeanna Harrison, MD as Referring Physician (Dermatology) Tomma Lightning, MD as  Consulting Physician (Pulmonary Disease)  Indicate any recent Medical Services you may have received from other than [facility] providers in the past year (date may be approximate).     Assessment:   This is a routine wellness examination for Courtney Bray.  Hearing/Vision screen Vision Screening - Comments:: Regular eye exams, [facility]  Dietary issues and exercise activities discussed: Current Exercise Habits: Home exercise routine, Type of exercise: walking, Time (Minutes): 45, Frequency (Times/Week): 5, Weekly Exercise (Minutes/Week): 225   Goals Addressed             This Visit's Progress    Patient Stated       03/23/2022, add some strength training to routine and keep diet as healthy as possible       Depression Screen    03/23/2022   10:25 AM 07/14/2021   11:11 AM 02/03/2021   11:54 AM 06/30/2020    9:07 AM 02/11/2020    8:27 AM 06/29/2019    9:03 AM 05/30/2018   11:06 AM  PHQ 2/9 Scores  PHQ - 2 Score 0 0 0 0 0 0 0  PHQ- 9 Score  0   0      Fall Risk    03/23/2022   10:25 AM 07/14/2021   11:10 AM 02/03/2021   11:56 AM 06/30/2020    9:06 AM 02/11/2020    8:26 AM  Fall Risk   Falls in the past year? 0 0 0 0 0  Number falls in past yr: 0  0 0 0  Injury with Fall? 0  0  0  Risk for fall due to : No Fall Risks  Impaired vision    Follow up Falls prevention discussed;Education provided;Falls evaluation completed  Falls prevention discussed  Falls evaluation completed;Falls prevention discussed    FALL RISK PREVENTION PERTAINING TO THE HOME:  Any stairs in or around the home?  Yes  If so, are there any without handrails? No  Home free of loose throw rugs in walkways, pet beds, electrical cords, etc? Yes  Adequate lighting in your home to reduce risk of falls? Yes   ASSISTIVE DEVICES UTILIZED TO PREVENT FALLS:  Life alert? No  Use of a cane, walker or w/c? No  Grab bars in the bathroom? No  Shower chair or bench in shower? No  Elevated toilet seat or a handicapped toilet? Yes   TIMED UP AND GO:  Was the test performed? Yes .  Length of time to ambulate 10 feet: 5 sec.   Gait steady and fast without use of assistive device  Cognitive Function:        03/23/2022   10:26 AM 02/03/2021   11:58 AM  6CIT Screen  What Year? 0 points 0 points  What month? 0 points 0 points  What time? 0 points 0 points  Count back from 20 0 points 0 points  Months in reverse 0 points 0 points  Repeat phrase 4 points 0 points  Total Score 4 points 0 points    Immunizations Immunization History  Administered Date(s) Administered   Fluad Quad(high Dose 65+) 04/05/2019, 03/23/2022   Influenza, High Dose Seasonal PF 05/30/2018   PFIZER(Purple Top)SARS-COV-2 Vaccination 08/04/2019, 08/29/2019, 02/14/2020, 09/26/2020   PPD Test 05/11/2013, 04/01/2014, 04/01/2015, 05/29/2015, 04/08/2017, 05/02/2017   Pneumococcal Conjugate-13 04/02/2016   Pneumococcal Polysaccharide-23 09/05/2012, 10/19/2017   Tdap 02/20/2007   Zoster, Live 04/22/2011    TDAP status: Due, Education has been provided regarding the importance of this vaccine. Advised may  receive this vaccine at local pharmacy or Health Dept. Aware to provide a copy of the vaccination record if obtained from local pharmacy or Health Dept. Verbalized acceptance and understanding.  Flu Vaccine status: Completed at today's visit  Pneumococcal vaccine status: Up to date  Covid-19 vaccine status: Completed vaccines  Qualifies for Shingles Vaccine? Yes   Zostavax completed Yes   Shingrix Completed?: Yes  Screening  Tests Health Maintenance  Topic Date Due   Diabetic kidney evaluation - Urine ACR  Never done   Hepatitis C Screening  Never done   Zoster Vaccines- Shingrix (1 of 2) Never done   TETANUS/TDAP  02/19/2017   COLONOSCOPY (Pts 45-55yrs Insurance coverage will need to be confirmed)  02/15/2019   COVID-19 Vaccine (5 - Pfizer risk series) 11/12/2026 (Originally 11/21/2020)   MAMMOGRAM  06/09/2022   Diabetic kidney evaluation - GFR measurement  07/14/2022   Pneumonia Vaccine 38+ Years old  Completed   INFLUENZA VACCINE  Completed   DEXA SCAN  Completed   HPV VACCINES  Aged Out    Health Maintenance  Health Maintenance Due  Topic Date Due   Diabetic kidney evaluation - Urine ACR  Never done   Hepatitis C Screening  Never done   Zoster Vaccines- Shingrix (1 of 2) Never done   TETANUS/TDAP  02/19/2017   COLONOSCOPY (Pts 45-1yrs Insurance coverage will need to be confirmed)  02/15/2019    Colorectal cancer screening: cologuard ordered today  Mammogram status: Completed 06/09/2020. Repeat every 2 years  Bone Density status: Completed 06/09/2020.   Lung Cancer Screening: (Low Dose CT Chest recommended if Age 25-80 years, 30 pack-year currently smoking OR have quit w/in 15years.) does not qualify.   Lung Cancer Screening Referral: no  Additional Screening:  Hepatitis C Screening: does qualify;   Vision Screening: Recommended annual ophthalmology exams for early detection of glaucoma and other disorders of the eye. Is the patient up to date with their annual eye exam?  Yes  Who is the provider or what is the name of the office in which the patient attends annual eye exams? [facility] If pt is not established with a provider, would they like to be referred to a provider to establish care? No .   Dental Screening: Recommended annual dental exams for proper oral hygiene  Community Resource Referral / Chronic Care Management: CRR required this visit?  No   CCM  required this visit?  No      Plan:     I have personally reviewed and noted the following in the patient's chart:   Medical and social history Use of alcohol, tobacco or illicit drugs  Current medications and supplements including opioid prescriptions. Patient is not currently taking opioid prescriptions. Functional ability and status Nutritional status Physical activity Advanced directives List of other physicians Hospitalizations, surgeries, and ER visits in previous 12 months Vitals Screenings to include cognitive, depression, and falls Referrals and appointments  In addition, I have reviewed and discussed with patient certain preventive protocols, quality metrics, and best practice recommendations. A written personalized care plan for preventive services as well as general preventive health recommendations were provided to patient.     Barb Merino, LPN   [id number]   Nurse Notes: none

## 2022-04-19 DIAGNOSIS — M25572 Pain in left ankle and joints of left foot: Secondary | ICD-10-CM | POA: Diagnosis not present

## 2022-05-25 DIAGNOSIS — Z5181 Encounter for therapeutic drug level monitoring: Secondary | ICD-10-CM | POA: Diagnosis not present

## 2022-05-25 DIAGNOSIS — D869 Sarcoidosis, unspecified: Secondary | ICD-10-CM | POA: Diagnosis not present

## 2022-05-25 DIAGNOSIS — Z79899 Other long term (current) drug therapy: Secondary | ICD-10-CM | POA: Diagnosis not present

## 2022-05-27 DIAGNOSIS — S8265XA Nondisplaced fracture of lateral malleolus of left fibula, initial encounter for closed fracture: Secondary | ICD-10-CM | POA: Diagnosis not present

## 2022-05-27 DIAGNOSIS — M25572 Pain in left ankle and joints of left foot: Secondary | ICD-10-CM | POA: Diagnosis not present

## 2022-05-31 LAB — FECAL OCCULT BLOOD, GUAIAC: Fecal Occult Blood: NEGATIVE

## 2022-06-01 DIAGNOSIS — Z5181 Encounter for therapeutic drug level monitoring: Secondary | ICD-10-CM | POA: Diagnosis not present

## 2022-06-03 DIAGNOSIS — Z0100 Encounter for examination of eyes and vision without abnormal findings: Secondary | ICD-10-CM | POA: Diagnosis not present

## 2022-06-04 DIAGNOSIS — H52223 Regular astigmatism, bilateral: Secondary | ICD-10-CM | POA: Diagnosis not present

## 2022-06-10 ENCOUNTER — Other Ambulatory Visit: Payer: Medicare HMO

## 2022-06-10 DIAGNOSIS — D72819 Decreased white blood cell count, unspecified: Secondary | ICD-10-CM

## 2022-06-10 LAB — CBC WITH DIFFERENTIAL/PLATELET
Basophils Absolute: 0 10*3/uL (ref 0.0–0.1)
Basophils Relative: 0.4 % (ref 0.0–3.0)
Eosinophils Absolute: 0.5 10*3/uL (ref 0.0–0.7)
Eosinophils Relative: 11.8 % — ABNORMAL HIGH (ref 0.0–5.0)
HCT: 45 % (ref 36.0–46.0)
Hemoglobin: 15.1 g/dL — ABNORMAL HIGH (ref 12.0–15.0)
Lymphocytes Relative: 55.5 % — ABNORMAL HIGH (ref 12.0–46.0)
Lymphs Abs: 2.5 10*3/uL (ref 0.7–4.0)
MCHC: 33.6 g/dL (ref 30.0–36.0)
MCV: 94.6 fl (ref 78.0–100.0)
Monocytes Absolute: 0.5 10*3/uL (ref 0.1–1.0)
Monocytes Relative: 10.3 % (ref 3.0–12.0)
Neutro Abs: 1 10*3/uL — ABNORMAL LOW (ref 1.4–7.7)
Neutrophils Relative %: 22 % — ABNORMAL LOW (ref 43.0–77.0)
Platelets: 292 10*3/uL (ref 150.0–400.0)
RBC: 4.75 Mil/uL (ref 3.87–5.11)
RDW: 12.9 % (ref 11.5–15.5)
WBC: 4.5 10*3/uL (ref 4.0–10.5)

## 2022-06-30 ENCOUNTER — Encounter: Payer: Self-pay | Admitting: Internal Medicine

## 2022-08-04 ENCOUNTER — Ambulatory Visit (INDEPENDENT_AMBULATORY_CARE_PROVIDER_SITE_OTHER): Payer: Medicare HMO | Admitting: Internal Medicine

## 2022-08-04 ENCOUNTER — Encounter: Payer: Self-pay | Admitting: Internal Medicine

## 2022-08-04 VITALS — BP 144/84 | HR 78 | Temp 97.8°F | Ht 61.9 in | Wt 116.0 lb

## 2022-08-04 DIAGNOSIS — E7800 Pure hypercholesterolemia, unspecified: Secondary | ICD-10-CM

## 2022-08-04 DIAGNOSIS — R030 Elevated blood-pressure reading, without diagnosis of hypertension: Secondary | ICD-10-CM

## 2022-08-04 DIAGNOSIS — D860 Sarcoidosis of lung: Secondary | ICD-10-CM | POA: Diagnosis not present

## 2022-08-04 DIAGNOSIS — Z79899 Other long term (current) drug therapy: Secondary | ICD-10-CM

## 2022-08-04 DIAGNOSIS — Z0000 Encounter for general adult medical examination without abnormal findings: Secondary | ICD-10-CM | POA: Diagnosis not present

## 2022-08-04 DIAGNOSIS — Z1159 Encounter for screening for other viral diseases: Secondary | ICD-10-CM | POA: Diagnosis not present

## 2022-08-04 DIAGNOSIS — M810 Age-related osteoporosis without current pathological fracture: Secondary | ICD-10-CM

## 2022-08-04 LAB — COMPREHENSIVE METABOLIC PANEL
ALT: 34 U/L (ref 0–35)
AST: 27 U/L (ref 0–37)
Albumin: 4.1 g/dL (ref 3.5–5.2)
Alkaline Phosphatase: 92 U/L (ref 39–117)
BUN: 12 mg/dL (ref 6–23)
CO2: 30 mEq/L (ref 19–32)
Calcium: 9.5 mg/dL (ref 8.4–10.5)
Chloride: 102 mEq/L (ref 96–112)
Creatinine, Ser: 0.69 mg/dL (ref 0.40–1.20)
GFR: 87.29 mL/min (ref >60.00)
Glucose, Bld: 88 mg/dL (ref 70–99)
Potassium: 3.8 mEq/L (ref 3.5–5.1)
Sodium: 138 mEq/L (ref 135–145)
Total Bilirubin: 0.5 mg/dL (ref 0.2–1.2)
Total Protein: 8 g/dL (ref 6.0–8.3)

## 2022-08-04 LAB — LIPID PANEL
Cholesterol: 212 mg/dL — ABNORMAL HIGH (ref 0–200)
HDL: 73.1 mg/dL (ref >39.00)
LDL Cholesterol: 127 mg/dL — ABNORMAL HIGH (ref 0–99)
NonHDL: 139.13
Total CHOL/HDL Ratio: 3
Triglycerides: 59 mg/dL (ref 0.0–149.0)
VLDL: 11.8 mg/dL (ref 0.0–40.0)

## 2022-08-04 LAB — TSH: TSH: 1.64 u[IU]/mL (ref 0.35–5.50)

## 2022-08-04 LAB — HEMOGLOBIN A1C: Hgb A1c MFr Bld: 5.3 % (ref 4.6–6.5)

## 2022-08-04 NOTE — Progress Notes (Signed)
Chief Complaint  Patient presents with   Annual Exam    HPI: Patient  Courtney Bray  72 y.o. comes in today for Preventive Health Care visit  And health evaluation Sarcoid  :  sees derm  scalp and  pumonary for lung  status  no sx  in Humira  Osteoporosis   on vit d supp Closed fracture of lateral malleolus of left fibula  per ortho in the fall  basically a twist  Hasn't checked bp but has been in range  Battling knee arthritis  Health Maintenance  Topic Date Due   Diabetic kidney evaluation - Urine ACR  Never done   Zoster Vaccines- Shingrix (1 of 2) Never done   DTaP/Tdap/Td (2 - Td or Tdap) 02/19/2017   COLONOSCOPY (Pts 45-41yr Insurance coverage will need to be confirmed)  02/15/2019   COVID-19 Vaccine (5 - 2023-24 season) 02/19/2022   MAMMOGRAM  06/09/2022   Medicare Annual Wellness (AWV)  03/24/2023   COLON CANCER SCREENING ANNUAL FOBT  06/01/2023   Diabetic kidney evaluation - eGFR measurement  08/05/2023   Pneumonia Vaccine 72 Years old  Completed   INFLUENZA VACCINE  Completed   DEXA SCAN  Completed   Hepatitis C Screening  Completed   HPV VACCINES  Aged Out   Health Maintenance Review LIFESTYLE:  Exercise:   sprained ankle and tiny fracture.  Avulsion  left better  Tobacco/ETS: no Alcohol:  ocass Sugar beverages: Sleep:  7-8  Drug use: no HH of  2 no pets   ROS:  GEN/ HEENT: No fever, significant weight changes sweats headaches vision problems hearing changes, CV/ PULM; No chest pain shortness of breath cough, syncope,edema  change in exercise tolerance. GI /GU: No adominal pain, vomiting, change in bowel habits. No blood in the stool. No significant GU symptoms. SKIN/HEME: ,no acute skin rashes suspicious lesions or bleeding. No lymphadenopathy, nodules, masses.  NEURO/ PSYCH:  No neurologic signs such as weakness numbness. No depression anxiety. IMM/ Allergy: No unusual infections.  Allergy .   REST of 12 system review negative except as per  HPI   Past Medical History:  Diagnosis Date   Chicken pox    Lymphocytosis    Osteoporosis    Sarcoidosis 02/24/2007   Dxed 1994 by TBBX + ISLD/LN on cxr Primarily manifests as cough and skin changes [facility]  PFT's 2010:  No obstruction, TLC 3.93 (87%), DLCO 89%. PFT's 2014:  No obstruction, TLC 3.07, DLCO 91%      Past Surgical History:  Procedure Laterality Date   TUBAL LIGATION  1979   UTERINE ARTERY EMBOLIZATION  2000    Family History  Problem Relation Age of Onset   Heart disease Father    Stroke Father    Dementia Father    High blood pressure Father    Dementia Mother    High blood pressure Mother    High blood pressure Sister        with hypertesnive renal disease    Social History   Socioeconomic History   Marital status: Married    Spouse name: Not on file   Number of children: 4   Years of education: Not on file   Highest education level: Not on file  Occupational History   Occupation: Self-employed  Tobacco Use   Smoking status: Never   Smokeless tobacco: Never  Vaping Use   Vaping Use: Never used  Substance and Sexual Activity   Alcohol  use: Yes    Alcohol/week: 0.0 standard drinks of alcohol    Comment: 1 drink of wine 1-2 times a week   Drug use: No   Sexual activity: Yes    Partners: Male  Other Topics Concern   Not on file  Social History Narrative   5-6 hours of sleep per night   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   Orig from [place]   in [place] since [place] pat woodard    Social Determinants of Health   Financial Resource Strain: Low Risk  (03/23/2022)   Overall Financial Resource Strain (CARDIA)    Difficulty of Paying Living Expenses: Not hard at all  Food Insecurity: No Food Insecurity  (03/23/2022)   Hunger Vital Sign    Worried About Running Out of Food in the Last Year: Never true    Ran Out of Food in the Last Year: Never true  Transportation Needs: No Transportation Needs (03/23/2022)   PRAPARE - Hydrologist (Medical): No    Lack of Transportation (Non-Medical): No  Physical Activity: Sufficiently Active (03/23/2022)   Exercise Vital Sign    Days of Exercise per Week: 5 days    Minutes of Exercise per Session: 50 min  Stress: No Stress Concern Present (03/23/2022)   [place]    Feeling of Stress : Not at all  Social Connections: Moderately Integrated (02/03/2021)   Social Connection and Isolation Panel [NHANES]    Frequency of Communication with Friends and Family: More than three times a week    Frequency of Social Gatherings with Friends and Family: More than three times a week    Attends Religious Services: More than 4 times per year    Active Member of [employer] or Organizations: No    Attends Archivist Meetings: Never    Marital Status: Married    Outpatient Medications Prior to Visit  Medication Sig Dispense Refill   clobetasol cream (TEMOVATE) 0.05 % Apply topically as needed.     HUMIRA PEN 40 MG/0.8ML PNKT Inject every other week for sarcoidosis     Multiple Vitamin (MULTIVITAMIN) tablet Take 1 tablet by mouth daily.     VITAMIN D PO Take 5,000 Units by mouth daily.     No facility-administered medications prior to visit.     EXAM:  BP (!) 144/84 (BP Location: Right Arm, Cuff Size: Normal)   Pulse 78   Temp 97.8 F (36.6 C) (Oral)   Ht 5' 1.9" (1.572 m)   Wt 116 lb (52.6 kg)   SpO2 98%   BMI 21.29 kg/m   Body mass index is 21.29 kg/m. Wt Readings from Last 3 Encounters:  08/04/22 116 lb (52.6 kg)  03/23/22 117 lb 1.6 oz (53.1 kg)  09/18/21 114 lb 12.8 oz (52.1 kg)    Physical Exam: Vital signs reviewed [id number] is a well-developed  well-nourished alert cooperative    who appearsr stated age in no acute distress.  HEENT: normocephalic atraumatic , Eyes: PERRL EOM's full, conjunctiva clear, Nares: paten,t no deformity discharge or tenderness., Ears: no deformity EAC's clear TMs with normal landmarks. Mouth: clear OP,  no lesions, edema.  Moist mucous membranes. Dentition in adequate repair. NECK: supple without masses, thyromegaly or bruits. CHEST/PULM:  Clear to auscultation and percussion breath sounds equal no wheeze , rales or rhonchi. No chest wall deformities or tenderness. Breast: normal by inspection . No dimpling, discharge, masses, tenderness or discharge . CV: PMI is nondisplaced, S1 S2 no gallops, murmurs, rubs. Peripheral pulses are full without delay.No JVD .  ABDOMEN: Bowel sounds normal nontender  No guard or rebound, no hepato splenomegal no CVA tenderness.   Extremtities:  No clubbing cyanosis or edema, no acute joint swelling or redness no focal atrophy NEURO:  Oriented x3, cranial nerves 3-12 appear to be intact, no obvious focal weakness,gait within normal limits no abnormal reflexes or asymmetrical SKIN: No acute rashes normal turgor, color, no bruising or petechiae. PSYCH: Oriented, good eye contact, no obvious depression anxiety, cognition and judgment appear normal. LN: no cervical axillary inguinal adenopathy  Lab Results  Component Value Date   WBC 4.5 06/10/2022   HGB 15.1 (H) 06/10/2022   HCT 45.0 06/10/2022   PLT 292.0 06/10/2022   GLUCOSE 88 08/04/2022   CHOL 212 (H) 08/04/2022   TRIG 59.0 08/04/2022   HDL 73.10 08/04/2022   LDLCALC 127 (H) 08/04/2022   ALT 34 08/04/2022   AST 27 08/04/2022   NA 138 08/04/2022   K 3.8 08/04/2022   CL 102 08/04/2022   CREATININE 0.69 08/04/2022   BUN 12 08/04/2022   CO2 30 08/04/2022   TSH 1.64 08/04/2022   HGBA1C 5.3 08/04/2022    BP Readings from Last 3 Encounters:  08/04/22 (!) 144/84  03/23/22 112/76  09/18/21 130/90  Last vitamin D Lab  Results  Component Value Date   VD25OH 54.29 09/18/2021     Lab plan reviewed with patient   ASSESSMENT AND PLAN:  Discussed the following assessment and plan:    ICD-10-CM   1. Visit for preventive health examination  Z00.00     2. Sarcoidosis, lung (HCC)  D86.0 Comprehensive metabolic panel    Lipid panel    TSH    Hemoglobin A1c    3. Osteoporosis without current pathological fracture, unspecified osteoporosis type  M81.0 Comprehensive metabolic panel    Lipid panel    TSH    Hemoglobin A1c   sees dr Darnell Level    4. Elevated BP without diagnosis of hypertension  R03.0 Comprehensive metabolic panel    Lipid panel    TSH    Hemoglobin A1c    5. Elevated LDL cholesterol level  E78.00 Comprehensive metabolic panel    Lipid panel    TSH    Hemoglobin A1c    6. Medication management  Z79.899 Comprehensive metabolic panel    Lipid panel    TSH    Hemoglobin A1c    7. Need for hepatitis C screening test  Z11.59 Hep C Antibody    Check bp at home  to ensure  controlled at goal and if LSI not adequate  consdier medication. Disc vit d and  dec sodium in diet  Disc vaccines  and timing  optimizing Note  EHR epic is flagging as diabetic but dont see any evidence that she is diabetic.  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  Patient Care Team: Ojani Berenson, Standley Brooking, MD as PCP - General (Internal Medicine) Garry Heater, MD as Referring  Physician (Dermatology) Laurin Coder, MD as Consulting Physician (Pulmonary Disease) Patient Instructions  Good to see you today  We can add medication for bp control if not in range  Take blood pressure readings twice a day for 5-7  days and then periodically .To ensure below 140/90   .Send in readings     and go from there.   Decrease sodium in diet . Activity as possible.     Standley Brooking. Alias Villagran M.D.

## 2022-08-04 NOTE — Patient Instructions (Addendum)
Good to see you today  We can add medication for bp control if not in range  Take blood pressure readings twice a day for 5-7  days and then periodically .To ensure below 140/90   .Send in readings     and go from there.   Decrease sodium in diet . Activity as possible.

## 2022-08-05 LAB — HEPATITIS C ANTIBODY: Hepatitis C Ab: NONREACTIVE

## 2022-08-09 NOTE — Progress Notes (Signed)
Cholesterol  is up some from last year  Chemistry  thryoid kidney liver tests are normal. Reassuring Make sure you get [place] some BP readings  to ensure in range

## 2022-09-24 ENCOUNTER — Ambulatory Visit: Payer: Medicare HMO | Admitting: Internal Medicine

## 2022-10-11 ENCOUNTER — Ambulatory Visit (INDEPENDENT_AMBULATORY_CARE_PROVIDER_SITE_OTHER): Payer: Medicare HMO

## 2022-10-11 ENCOUNTER — Encounter: Payer: Self-pay | Admitting: Pulmonary Disease

## 2022-10-11 ENCOUNTER — Ambulatory Visit: Payer: Medicare HMO | Admitting: Pulmonary Disease

## 2022-10-11 VITALS — BP 130/78 | HR 82 | Ht 61.5 in | Wt 119.0 lb

## 2022-10-11 DIAGNOSIS — D869 Sarcoidosis, unspecified: Secondary | ICD-10-CM

## 2022-10-11 NOTE — Progress Notes (Signed)
Courtney Bray    [id number]    February 06, 1951  Primary Care Physician:Panosh, Neta Mends, MD  Referring Physician: Madelin Headings, MD [address] [place],  [place] [address]  Chief complaint:   Patient with a history of sarcoidosis Has been doing relatively well  HPI:  Follow-up with Dr. Kriste Basque previously on I did see her about 3 years ago  She has a chronic cough that does not limit send She is on Humira for skin involvement with sarcoidosis [facility]  Denies any significant shortness of breath No chest pressure Exercises regularly, walks daily  Does not feel limited   Outpatient Encounter Medications as of 10/11/2022  Medication Sig   clobetasol cream (TEMOVATE) 0.05 % Apply topically as needed.   HUMIRA PEN 40 MG/0.8ML PNKT Inject every other week for sarcoidosis   Multiple Vitamin (MULTIVITAMIN) tablet Take 1 tablet by mouth daily.   VITAMIN D PO Take 5,000 Units by mouth daily.   No facility-administered encounter medications on file as of 10/11/2022.    Allergies as of 10/11/2022 - Review Complete 10/11/2022  Allergen Reaction Noted   Amoxicillin Rash 08/09/2017   Cholecalciferol Rash 10/11/2022   Vitamin d analogs Rash 04/05/2015    Past Medical History:  Diagnosis Date   Chicken pox    Lymphocytosis    Osteoporosis    Sarcoidosis 02/24/2007   Dxed 1994 by TBBX + ISLD/LN on cxr Primarily manifests as cough and skin changes Tx with Humira by derm at [facility].  PFT's 2010:  No obstruction, TLC 3.93 (87%), DLCO 89%. PFT's 2014:  No obstruction, TLC 3.07, DLCO 91%      Past Surgical History:  Procedure Laterality Date   TUBAL LIGATION  1979   UTERINE ARTERY EMBOLIZATION  2000    Family History  Problem Relation Age of Onset   Heart disease Father    Stroke Father    Dementia Father    High blood pressure Father    Dementia Mother    High blood pressure Mother    High blood pressure Sister        with hypertesnive renal disease     Social History   Socioeconomic History   Marital status: Married    Spouse name: Not on file   Number of children: 4   Years of education: Not on file   Highest education level: Not on file  Occupational History   Occupation: Self-employed  Tobacco Use   Smoking status: Never   Smokeless tobacco: Never  Vaping Use   Vaping Use: Never used  Substance and Sexual Activity   Alcohol use: Yes    Alcohol/week: 0.0 standard drinks of alcohol    Comment: 1 drink of wine 1-2 times a week   Drug use: No   Sexual activity: Yes    Partners: Male  Other Topics Concern   Not on file  Social History Narrative   5-6 hours of sleep per night   Lives with her husband who is retired Engineer, 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   Orig from [place]   in [place] since 1988   [name]    Social Determinants of Health   Financial  Resource Strain: Low Risk  (03/23/2022)   Overall Financial Resource Strain (CARDIA)    Difficulty of Paying Living Expenses: Not hard at all  Food Insecurity: No Food Insecurity (03/23/2022)   Hunger Vital Sign    Worried About Running Out of Food in the Last Year: Never true    Ran Out of Food in the Last Year: Never true  Transportation Needs: No Transportation Needs (03/23/2022)   PRAPARE - Administrator, Civil Service (Medical): No    Lack of Transportation (Non-Medical): No  Physical Activity: Sufficiently Active (03/23/2022)   Exercise Vital Sign    Days of Exercise per Week: 5 days    Minutes of Exercise per Session: 50 min  Stress: No Stress Concern Present (03/23/2022)   [employer] of Occupational Health - Occupational Stress Questionnaire    Feeling of Stress : Not at all  Social Connections: Moderately Integrated (02/03/2021)   Social Connection and Isolation  Panel [NHANES]    Frequency of Communication with Friends and Family: More than three times a week    Frequency of Social Gatherings with Friends and Family: More than three times a week    Attends Religious Services: More than 4 times per year    Active Member of [employer] or Organizations: No    Attends Banker Meetings: Never    Marital Status: Married  Catering manager Violence: Not At Risk (02/03/2021)   Humiliation, Afraid, Rape, and Kick questionnaire    Fear of Current or Ex-Partner: No    Emotionally Abused: No    Physically Abused: No    Sexually Abused: No    Review of Systems  Respiratory:  Negative for cough and shortness of breath.   Musculoskeletal:  Negative for arthralgias.  All other systems reviewed and are negative.   Vitals:   10/11/22 1146  BP: 130/78  Pulse: 82  SpO2: 97%     Physical Exam Constitutional:      Appearance: Normal appearance.  HENT:     Head: Normocephalic and atraumatic.     Nose: No congestion or rhinorrhea.  Eyes:     General:        Right eye: No discharge.        Left eye: No discharge.  Cardiovascular:     Rate and Rhythm: Normal rate and regular rhythm.     Heart sounds: No murmur heard.    No friction rub.  Pulmonary:     Effort: Pulmonary effort is normal. No respiratory distress.     Breath sounds: Normal breath sounds. No stridor. No wheezing or rhonchi.  Musculoskeletal:     Cervical back: No rigidity or tenderness.  Lymphadenopathy:     Cervical: No cervical adenopathy.  Neurological:     General: No focal deficit present.     Mental Status: She is alert.  Psychiatric:        Mood and Affect: Mood normal.    Data Reviewed: Previous CT from 2004 reviewed-film not available but report reviewed  Most recent chest x-ray from 2019 reviewed showing coarse markings, mediastinal adenopathy  PFT from 2014 was within normal limit  Assessment:  Sarcoidosis -Adenopathy with some calcification -X-rays  have been stable  Chronic mild cough -Controlled symptoms  Sarcoidosis affecting the skin -Has been on Humira  Plan/Recommendations: Will obtain a chest x-ray today  Encouraged to continue staying active  Follow-up in about a year  Call with significant changes in symptoms    Sumeya Yontz Wynona Neat  MD [facility] 10/11/2022, 11:50 AM  CC: Panosh, Neta Mends, MD

## 2022-10-11 NOTE — Patient Instructions (Signed)
We will get a chest x-ray today  Continue regular activities as tolerated  We do not need to change anything at present  Call us with significant concerns

## 2022-10-14 DIAGNOSIS — I10 Essential (primary) hypertension: Secondary | ICD-10-CM | POA: Diagnosis not present

## 2022-10-14 DIAGNOSIS — M1990 Unspecified osteoarthritis, unspecified site: Secondary | ICD-10-CM | POA: Diagnosis not present

## 2022-10-14 DIAGNOSIS — Z79620 Long term (current) use of immunosuppressive biologic: Secondary | ICD-10-CM | POA: Diagnosis not present

## 2022-10-14 DIAGNOSIS — D860 Sarcoidosis of lung: Secondary | ICD-10-CM | POA: Diagnosis not present

## 2022-10-14 DIAGNOSIS — E785 Hyperlipidemia, unspecified: Secondary | ICD-10-CM | POA: Diagnosis not present

## 2022-10-14 DIAGNOSIS — Z008 Encounter for other general examination: Secondary | ICD-10-CM | POA: Diagnosis not present

## 2022-10-14 DIAGNOSIS — M810 Age-related osteoporosis without current pathological fracture: Secondary | ICD-10-CM | POA: Diagnosis not present

## 2022-10-16 DIAGNOSIS — R69 Illness, unspecified: Secondary | ICD-10-CM | POA: Diagnosis not present

## 2022-11-20 DIAGNOSIS — R69 Illness, unspecified: Secondary | ICD-10-CM | POA: Diagnosis not present

## 2022-12-21 DIAGNOSIS — R69 Illness, unspecified: Secondary | ICD-10-CM | POA: Diagnosis not present

## 2022-12-27 DIAGNOSIS — R69 Illness, unspecified: Secondary | ICD-10-CM | POA: Diagnosis not present

## 2022-12-28 DIAGNOSIS — R69 Illness, unspecified: Secondary | ICD-10-CM | POA: Diagnosis not present

## 2022-12-30 DIAGNOSIS — R69 Illness, unspecified: Secondary | ICD-10-CM | POA: Diagnosis not present

## 2023-01-04 DIAGNOSIS — R69 Illness, unspecified: Secondary | ICD-10-CM | POA: Diagnosis not present

## 2023-01-11 DIAGNOSIS — R69 Illness, unspecified: Secondary | ICD-10-CM | POA: Diagnosis not present

## 2023-01-13 DIAGNOSIS — R69 Illness, unspecified: Secondary | ICD-10-CM | POA: Diagnosis not present

## 2023-01-18 DIAGNOSIS — R69 Illness, unspecified: Secondary | ICD-10-CM | POA: Diagnosis not present

## 2023-02-15 DIAGNOSIS — R69 Illness, unspecified: Secondary | ICD-10-CM | POA: Diagnosis not present

## 2023-02-24 ENCOUNTER — Ambulatory Visit: Payer: Medicare HMO | Admitting: Internal Medicine

## 2023-03-25 ENCOUNTER — Other Ambulatory Visit: Payer: Self-pay | Admitting: Internal Medicine

## 2023-03-25 DIAGNOSIS — Z1211 Encounter for screening for malignant neoplasm of colon: Secondary | ICD-10-CM

## 2023-03-25 DIAGNOSIS — Z1212 Encounter for screening for malignant neoplasm of rectum: Secondary | ICD-10-CM

## 2023-03-29 ENCOUNTER — Ambulatory Visit (INDEPENDENT_AMBULATORY_CARE_PROVIDER_SITE_OTHER): Payer: Medicare HMO | Admitting: Family Medicine

## 2023-03-29 ENCOUNTER — Encounter: Payer: Self-pay | Admitting: Family Medicine

## 2023-03-29 VITALS — BP 131/75 | Wt 120.0 lb

## 2023-03-29 DIAGNOSIS — Z1231 Encounter for screening mammogram for malignant neoplasm of breast: Secondary | ICD-10-CM

## 2023-03-29 DIAGNOSIS — Z0000 Encounter for general adult medical examination without abnormal findings: Secondary | ICD-10-CM | POA: Diagnosis not present

## 2023-03-29 NOTE — Patient Instructions (Signed)
I really enjoyed getting to talk with you today! I am available on Tuesdays and Thursdays for virtual visits if you have any questions or concerns, or if I can be of any further assistance.   CHECKLIST FROM ANNUAL WELLNESS VISIT:  -Follow up (please call to schedule if not scheduled after visit):   -yearly for annual wellness visit with primary care office  Here is a list of your preventive care/health maintenance measures and the plan for each if any are due:  PLAN For any measures below that may be due:  -get the flu and covid updated vaccines soon in the next 1 month -get the tetanus vaccine -please let us know when you get the vaccines so that we can update your chart -get the mammogram asap -discusse bone density test with your endocrinologist and send [place] the most recent report -get the colon cancer screening in december  Health Maintenance  Topic Date Due   DTaP/Tdap/Td (2 - Td or Tdap) 02/19/2017   Fecal DNA (Cologuard)  06/19/2021   MAMMOGRAM  06/09/2022   INFLUENZA VACCINE  04/14/2023 (Originally 01/20/2023)   COVID-19 Vaccine (5 - 2023-24 season) 04/14/2023 (Originally 02/20/2023)   COLON CANCER SCREENING ANNUAL FOBT  06/01/2023   Medicare Annual Wellness (AWV)  03/28/2024   Pneumonia Vaccine 22+ Years old  Completed   DEXA SCAN  Completed   Hepatitis C Screening  Completed   Zoster Vaccines- Shingrix  Completed   HPV VACCINES  Aged Out   Colonoscopy  Discontinued    -See a dentist at least yearly  -Get your eyes checked and then per your eye specialist's recommendations  -Other issues addressed today:   -I have included below further information regarding a healthy whole foods based diet, physical activity guidelines for adults, stress management and opportunities for social connections. I hope you find this information useful.    -----------------------------------------------------------------------------------------------------------------------------------------------------------------------------------------------------------------------------------------------------------  NUTRITION: -eat real food: lots of colorful vegetables (half the plate) and fruits -5-7 servings of vegetables and fruits per day (fresh or steamed is best), exp. 2 servings of vegetables with lunch and dinner and 2 servings of fruit per day. Berries and greens such as kale and collards are great choices.  -consume on a regular basis: whole grains (make sure first ingredient on label contains the word "whole"), fresh fruits, fish, nuts, seeds, healthy oils (such as olive oil, avocado oil, grape seed oil) -may eat small amounts of dairy and lean meat on occasion, but avoid processed meats such as ham, bacon, lunch meat, etc. -drink water -try to avoid fast food and pre-packaged foods, processed meat -most experts advise limiting sodium to < 2300mg  per day, should limit further is any chronic conditions such as high blood pressure, heart disease, diabetes, etc. The American Heart Association advised that < 1500mg  is is ideal -try to avoid foods that contain any ingredients with names you do not recognize  -try to avoid sugar/sweets (except for the natural sugar that occurs in fresh fruit) -try to avoid sweet drinks -try to avoid white rice, white bread, pasta (unless whole grain), white or yellow potatoes  EXERCISE GUIDELINES FOR ADULTS: -if you wish to increase your physical activity, do so gradually and with the approval of your doctor -STOP and seek medical care immediately if you have any chest pain, chest discomfort or trouble breathing when starting or increasing exercise  -move and stretch your body, legs, feet and arms when sitting for long periods -Physical activity guidelines for optimal health in adults: -least  150 minutes per week of  aerobic exercise (can talk, but not sing) once approved by your doctor, 20-30 minutes of sustained activity or two 10 minute episodes of sustained activity every day.  -resistance training at least 2 days per week if approved by your doctor -balance exercises 3+ days per week:   Stand somewhere where you have something sturdy to hold onto if you lose balance.    1) lift up on toes, start with 5x per day and work up to 20x   2) stand and lift on leg straight out to the side so that foot is a few inches of the floor, start with 5x each side and work up to 20x each side   3) stand on one foot, start with 5 seconds each side and work up to 20 seconds on each side  If you need ideas or help with getting more active:  -Silver sneakers https://tools.silversneakers.com  -Walk with a Doc: http://www.duncan-williams.com/  -try to include resistance (weight lifting/strength building) and balance exercises twice per week: or the following link for ideas: http://castillo-powell.com/  BuyDucts.dk  STRESS MANAGEMENT: -can try meditating, or just sitting quietly with deep breathing while intentionally relaxing all parts of your body for 5 minutes daily -if you need further help with stress, anxiety or depression please follow up with your primary doctor or contact the wonderful folks at [facility]: [phone]  SOCIAL CONNECTIONS: -options in [place] if you wish to engage in more social and exercise related activities:  -Silver sneakers https://tools.silversneakers.com  -Walk with a Doc: http://www.duncan-williams.com/  -Check out the [facility] Active Adults 50+ section on the [place] [employer] (hiking clubs, book clubs, cards and games, chess, exercise classes, aquatic classes and much more) - see the website for  details: https://www.[place].gov/departments/parks-recreation/active-adults50  -YouTube has lots of exercise videos for different ages and abilities as well  -[facility] (a variety of indoor and outdoor inperson activities for adults). ([phone]. [address]  -Virtual Online Classes (a variety of topics): see seniorplanet.org or call 9128826352  -consider volunteering at a school, hospice center, church, senior center or elsewhere

## 2023-03-29 NOTE — Progress Notes (Signed)
PATIENT CHECK-IN and HEALTH RISK ASSESSMENT QUESTIONNAIRE:  -completed by phone/video for upcoming Medicare Preventive Visit  Pre-Visit Check-in: 1)Vitals (height, wt, BP, etc) - record in vitals section for visit on day of visit Request home vitals (wt, BP, etc.) and enter into vitals, THEN update Vital Signs SmartPhrase below at the top of the HPI. See below.  2)Review and Update Medications, Allergies PMH, Surgeries, Social history in Epic 3)Hospitalizations in the last year with date/reason? No  4)Review and Update Care Team (patient's specialists) in Epic 5) Complete PHQ9 in Epic  6) Complete Fall Screening in Epic 7)Review all Health Maintenance Due and order under PCP if not done.  8)Medicare Wellness Questionnaire: Answer theses question about your habits: Do you drink alcohol? Occasional If yes, how many drinks do you have a day? 1 Have you ever smoked?No Quit date if applicable? N/A  How many packs a day do/did you smoke? N/A Do you use smokeless tobacco? No Do you use an illicit drugs? No Do you exercise? Yes IF so, what type and how many days/minutes per week? Twice a week with personal trainer, stretching class and walks several days per week or does rebounder Are you sexually active? Yes Number of partners? 1  Typical breakfast: fruit, eggs, toast and apple, [place] bacon Typical lunch Salad or sandwich, or peanut butter Typical dinner Meat and vegetables or rice Typical snacks: Nuts, Potato chips, popcorn, crackers and cheese  Beverages: Water, Hot tea (no sugar), coffee  Answer theses question about you: Can you perform most household chores? Yes Do you find it hard to follow a conversation in a noisy room? No Do you often ask people to speak up or repeat themselves? No Do you feel that you have a problem with memory? No Do you balance your checkbook and or bank acounts? No Do you feel safe at home? Yes Last dentist visit? 2 months ago Do you need assistance with  any of the following: Please note if so No  Driving? No   Feeding yourself?  No  Getting from bed to chair? No  Getting to the toilet? No  Bathing or showering? No  Dressing yourself?  Managing money?  Climbing a flight of stairs  Preparing meals?  Do you have Advanced Directives in place (Living Will, Healthcare Power or Attorney)? Yes, Healthcare Power of Attorney   Last eye Exam and location? 05/2022, [facility](normally)   Do you currently use prescribed or non-prescribed narcotic or opioid pain medications?  Do you have a history or close family history of breast, ovarian, tubal or peritoneal cancer or a family member with BRCA (breast cancer susceptibility 1 and 2) gene mutations? No  Request home vitals (wt, BP, etc.) and enter into vitals, THEN update Vital Signs SmartPhrase below at the top of the HPI. See below.   Nurse/Assistant Credentials/time stamp: BDS 03/29/23 9:59am   ----------------------------------------------------------------------------------------------------------------------------------------------------------------------------------------------------------------------  Because this visit was a virtual/telehealth visit, some criteria may be missing or patient reported. Any vitals not documented were not able to be obtained and vitals that have been documented are patient reported.    MEDICARE ANNUAL PREVENTIVE VISIT WITH PROVIDER: (Welcome to Medicare, initial annual wellness or annual wellness exam)  Virtual Visit via Video Note  I connected with Courtney Bray on 03/29/23 by a video enabled telemedicine application and verified that I am speaking with the correct person using two identifiers. Spent about half the time on video and then had to concert to audio due to technical  issues.   Location patient: home Location provider:work or home office Persons participating in the virtual visit: patient, provider  Concerns and/or follow up  today: no concern. BP was a little high this morning as reports was a little nervous and running around to get the cuff for the appt.    See HM section in Epic for other details of completed HM.    ROS: negative for report of fevers, unintentional weight loss, vision changes, vision loss, hearing loss or change, chest pain, sob, hemoptysis, melena, hematochezia, hematuria, falls, bleeding or bruising, thoughts of suicide or self harm, memory loss  Patient-completed extensive health risk assessment - reviewed and discussed with the patient: See Health Risk Assessment completed with patient prior to the visit either above or in recent phone note. This was reviewed in detailed with the patient today and appropriate recommendations, orders and referrals were placed as needed per Summary below and patient instructions.   Review of Medical History: -PMH, PSH, Family History and current specialty and care providers reviewed and updated and listed below   Patient Care Team: Panosh, Neta Mends, MD as PCP - General (Internal Medicine) Renaldo Reel Georgeanna Harrison, MD as Referring Physician (Dermatology) Tomma Lightning, MD as Consulting Physician (Pulmonary Disease)   Past Medical History:  Diagnosis Date   Chicken pox    Lymphocytosis    Osteoporosis    Sarcoidosis 02/24/2007   Dxed 1994 by TBBX + ISLD/LN on cxr Primarily manifests as cough and skin changes Tx with Humira by derm at [facility].  PFT's 2010:  No obstruction, TLC 3.93 (87%), DLCO 89%. PFT's 2014:  No obstruction, TLC 3.07, DLCO 91%      Past Surgical History:  Procedure Laterality Date   TUBAL LIGATION  1979   UTERINE ARTERY EMBOLIZATION  2000    Social History   Socioeconomic History   Marital status: Married    Spouse name: Not on file   Number of children: 4   Years of education: Not on file   Highest education level: Not on file  Occupational History   Occupation: Self-employed  Tobacco Use   Smoking status: Never    Smokeless tobacco: Never  Vaping Use   Vaping status: Never Used  Substance and Sexual Activity   Alcohol use: Yes    Alcohol/week: 0.0 standard drinks of alcohol    Comment: 1 drink of wine 1-2 times a week   Drug use: No   Sexual activity: Yes    Partners: Male  Other Topics Concern   Not on file  Social History Narrative   5-6 hours of sleep per night   Lives with her husband who is retired Engineer, 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   Orig from [place]   in [place] since 1988   [name]    Social Determinants of Health   Financial Resource Strain: Low Risk  (03/23/2022)   Overall Financial Resource Strain (CARDIA)    Difficulty of Paying Living Expenses: Not hard at all  Food Insecurity: No Food Insecurity (03/23/2022)   Hunger Vital Sign    Worried About Running Out of Food in the Last Year: Never true    Ran Out of Food in the Last Year: Never true  Transportation  Needs: No Transportation Needs (03/23/2022)   PRAPARE - Administrator, Civil Service (Medical): No    Lack of Transportation (Non-Medical): No  Physical Activity: Sufficiently Active (03/23/2022)   Exercise Vital Sign    Days of Exercise per Week: 5 days    Minutes of Exercise per Session: 50 min  Stress: No Stress Concern Present (03/23/2022)   [employer] of Occupational Health - Occupational Stress Questionnaire    Feeling of Stress : Not at all  Social Connections: Moderately Integrated (02/03/2021)   Social Connection and Isolation Panel [NHANES]    Frequency of Communication with Friends and Family: More than three times a week    Frequency of Social Gatherings with Friends and Family: More than three times a week    Attends Religious Services: More than 4 times per year    Active Member of [employer] or Organizations: No     Attends Banker Meetings: Never    Marital Status: Married  Catering manager Violence: Not At Risk (02/03/2021)   Humiliation, Afraid, Rape, and Kick questionnaire    Fear of Current or Ex-Partner: No    Emotionally Abused: No    Physically Abused: No    Sexually Abused: No    Family History  Problem Relation Age of Onset   Heart disease Father    Stroke Father    Dementia Father    High blood pressure Father    Dementia Mother    High blood pressure Mother    High blood pressure Sister        with hypertesnive renal disease    Current Outpatient Medications on File Prior to Visit  Medication Sig Dispense Refill   B Complex Vitamins (VITAMIN B COMPLEX PO) Take by mouth daily.     CALCIUM PO Take by mouth daily.     COLLAGEN PO Take by mouth daily.     HUMIRA PEN 40 MG/0.8ML PNKT Inject every other week for sarcoidosis     Multiple Vitamin (MULTIVITAMIN) tablet Take 1 tablet by mouth daily.     VITAMIN D PO Take 5,000 Units by mouth daily.     clobetasol cream (TEMOVATE) 0.05 % Apply topically as needed. (Patient not taking: Reported on 03/29/2023)     No current facility-administered medications on file prior to visit.    Allergies  Allergen Reactions   Amoxicillin Rash   Cholecalciferol Rash   Vitamin D Analogs Rash    high dose  Pill had rash        Physical Exam Vitals requested from patient and listed below if patient had equipment and was able to obtain at home for this virtual visit: Vitals:   03/29/23 0949 03/29/23 1000  BP: (!) 141/78 131/75   Estimated body mass index is 22.31 kg/m as calculated from the following:   Height as of 10/11/22: 5' 1.5" (1.562 m).   Weight as of this encounter: 120 lb (54.4 kg).  EKG (optional): deferred due to virtual visit  GENERAL: alert, oriented, no acute distress detected, full vision exam deferred due to pandemic and/or virtual encounter  HEENT: atraumatic, conjunttiva clear, no obvious abnormalities on  inspection of external nose and ears  NECK: normal movements of the head and neck  LUNGS: on inspection no signs of respiratory distress, breathing rate appears normal, no obvious gross SOB, gasping or wheezing  CV: no obvious cyanosis  MS: moves all visible extremities without noticeable abnormality  PSYCH/NEURO: pleasant and cooperative, no obvious depression  or anxiety, speech and thought processing grossly intact, Cognitive function grossly intact  Flowsheet Row Office Visit from 08/04/2022 in [facility] [place]  PHQ-9 Total Score 0           03/29/2023   10:05 AM 08/04/2022   11:10 AM 03/23/2022   10:25 AM 07/14/2021   11:11 AM 02/03/2021   11:54 AM  Depression screen PHQ 2/9  Decreased Interest 0 0 0 0 0  Down, Depressed, Hopeless 0 0 0 0 0  PHQ - 2 Score 0 0 0 0 0  Altered sleeping  0  0   Tired, decreased energy  0  0   Change in appetite  0  0   Feeling bad or failure about yourself   0  0   Trouble concentrating  0  0   Moving slowly or fidgety/restless  0  0   Suicidal thoughts  0  0   PHQ-9 Score  0  0   Difficult doing work/chores  Not difficult at all          02/03/2021   11:56 AM 07/14/2021   11:10 AM 03/23/2022   10:25 AM 08/04/2022   11:10 AM 03/29/2023   10:06 AM  Fall Risk  Falls in the past year? 0 0 0 0 0  Was there an injury with Fall? 0  0 0 0  Fall Risk Category Calculator 0  0 0 0  Fall Risk Category (Retired) Low  Low    (RETIRED) Patient Fall Risk Level   Low fall risk    Patient at Risk for Falls Due to Impaired vision  No Fall Risks No Fall Risks No Fall Risks  Fall risk Follow up Falls prevention discussed  Falls prevention discussed;Education provided;Falls evaluation completed Falls evaluation completed Falls evaluation completed     SUMMARY AND PLAN:  Encounter for Medicare annual wellness exam  Encounter for screening mammogram for malignant neoplasm of breast - Plan: MM 3D SCREENING MAMMOGRAM BILATERAL  BREAST   Discussed applicable health maintenance/preventive health measures and advised and referred or ordered per patient preferences: -had FIT test December 2023 per chart - she plans to do the cologuard test this time and has the order -ordered mammogram today and she agrees to call to schedule -she sees endo for bone health -she plans to get the vaccines soon at the pharmacy, reviewed indications for each due Health Maintenance  Topic Date Due   DTaP/Tdap/Td (2 - Td or Tdap) 02/19/2017   Fecal DNA (Cologuard)  06/19/2021   MAMMOGRAM  06/09/2022   INFLUENZA VACCINE  04/14/2023 (Originally 01/20/2023)   COVID-19 Vaccine (5 - 2023-24 season) 04/14/2023 (Originally 02/20/2023)   COLON CANCER SCREENING ANNUAL FOBT  06/01/2023   Medicare Annual Wellness (AWV)  03/28/2024   Pneumonia Vaccine 57+ Years old  Completed   DEXA SCAN  Completed   Hepatitis C Screening  Completed   Zoster Vaccines- Shingrix  Completed   HPV VACCINES  Aged Out   Colonoscopy  Discontinued     Education and counseling on the following was provided based on the above review of health and a plan/checklist for the patient, along with additional information discussed, was provided for the patient in the patient instructions :  -reviewed BP goals, lifestyle changes to treat and lower BP (sodium 1500mg  per day, whole foods healthy diet to include leafy lower oxalate greens and berries daily and avoidance of ultraprocessed foods.) encouraged continued regular exercise. Advised to monitor  BP at home and to call PCP office in a few weeks with update.  -Advised and counseled on a healthy lifestyle - including the importance of a healthy diet, regular physical activity, social connections and stress management. -Reviewed patient's current diet. Advised and counseled on a whole foods based healthy diet. A summary of a healthy diet was provided in the Patient Instructions.  -reviewed patient's current physical activity level and  discussed exercise guidelines for adults. Discussed community resources and ideas for safe exercise at home to assist in meeting exercise guideline recommendations in a safe and healthy way.  -Advise yearly dental visits at minimum and regular eye exams -Advised and counseled on alcohol safe limits, risks  Follow up: see patient instructions     Patient Instructions  I really enjoyed getting to talk with you today! I am available on Tuesdays and Thursdays for virtual visits if you have any questions or concerns, or if I can be of any further assistance.   CHECKLIST FROM ANNUAL WELLNESS VISIT:  -Follow up (please call to schedule if not scheduled after visit):   -yearly for annual wellness visit with primary care office  Here is a list of your preventive care/health maintenance measures and the plan for each if any are due:  PLAN For any measures below that may be due:  -get the flu and covid updated vaccines soon in the next 1 month -get the tetanus vaccine -please let us know when you get the vaccines so that we can update your chart -get the mammogram asap -discusse bone density test with your endocrinologist and send [place] the most recent report -get the colon cancer screening in december  Health Maintenance  Topic Date Due   DTaP/Tdap/Td (2 - Td or Tdap) 02/19/2017   Fecal DNA (Cologuard)  06/19/2021   MAMMOGRAM  06/09/2022   INFLUENZA VACCINE  04/14/2023 (Originally 01/20/2023)   COVID-19 Vaccine (5 - 2023-24 season) 04/14/2023 (Originally 02/20/2023)   COLON CANCER SCREENING ANNUAL FOBT  06/01/2023   Medicare Annual Wellness (AWV)  03/28/2024   Pneumonia Vaccine 37+ Years old  Completed   DEXA SCAN  Completed   Hepatitis C Screening  Completed   Zoster Vaccines- Shingrix  Completed   HPV VACCINES  Aged Out   Colonoscopy  Discontinued    -See a dentist at least yearly  -Get your eyes checked and then per your eye specialist's recommendations  -Other issues addressed  today:   -I have included below further information regarding a healthy whole foods based diet, physical activity guidelines for adults, stress management and opportunities for social connections. I hope you find this information useful.   -----------------------------------------------------------------------------------------------------------------------------------------------------------------------------------------------------------------------------------------------------------  NUTRITION: -eat real food: lots of colorful vegetables (half the plate) and fruits -5-7 servings of vegetables and fruits per day (fresh or steamed is best), exp. 2 servings of vegetables with lunch and dinner and 2 servings of fruit per day. Berries and greens such as kale and collards are great choices.  -consume on a regular basis: whole grains (make sure first ingredient on label contains the word "whole"), fresh fruits, fish, nuts, seeds, healthy oils (such as olive oil, avocado oil, grape seed oil) -may eat small amounts of dairy and lean meat on occasion, but avoid processed meats such as ham, bacon, lunch meat, etc. -drink water -try to avoid fast food and pre-packaged foods, processed meat -most experts advise limiting sodium to < 2300mg  per day, should limit further is any chronic conditions such as high blood pressure,  heart disease, diabetes, etc. The American Heart Association advised that < 1500mg  is is ideal -try to avoid foods that contain any ingredients with names you do not recognize  -try to avoid sugar/sweets (except for the natural sugar that occurs in fresh fruit) -try to avoid sweet drinks -try to avoid white rice, white bread, pasta (unless whole grain), white or yellow potatoes  EXERCISE GUIDELINES FOR ADULTS: -if you wish to increase your physical activity, do so gradually and with the approval of your doctor -STOP and seek medical care immediately if you have any chest pain, chest  discomfort or trouble breathing when starting or increasing exercise  -move and stretch your body, legs, feet and arms when sitting for long periods -Physical activity guidelines for optimal health in adults: -least 150 minutes per week of aerobic exercise (can talk, but not sing) once approved by your doctor, 20-30 minutes of sustained activity or two 10 minute episodes of sustained activity every day.  -resistance training at least 2 days per week if approved by your doctor -balance exercises 3+ days per week:   Stand somewhere where you have something sturdy to hold onto if you lose balance.    1) lift up on toes, start with 5x per day and work up to 20x   2) stand and lift on leg straight out to the side so that foot is a few inches of the floor, start with 5x each side and work up to 20x each side   3) stand on one foot, start with 5 seconds each side and work up to 20 seconds on each side  If you need ideas or help with getting more active:  -Silver sneakers https://tools.silversneakers.com  -Walk with a Doc: http://www.duncan-williams.com/  -try to include resistance (weight lifting/strength building) and balance exercises twice per week: or the following link for ideas: http://castillo-powell.com/  BuyDucts.dk  STRESS MANAGEMENT: -can try meditating, or just sitting quietly with deep breathing while intentionally relaxing all parts of your body for 5 minutes daily -if you need further help with stress, anxiety or depression please follow up with your primary doctor or contact the wonderful folks at [facility]: [phone]  SOCIAL CONNECTIONS: -options in [place] if you wish to engage in more social and exercise related activities:  -Silver sneakers https://tools.silversneakers.com  -Walk with a Doc: http://www.duncan-williams.com/  -Check out the [facility] Active Adults 50+  section on the [place] of [employer] (hiking clubs, book clubs, cards and games, chess, exercise classes, aquatic classes and much more) - see the website for details: https://www.[place].gov/departments/parks-recreation/active-adults50  -YouTube has lots of exercise videos for different ages and abilities as well  -[facility] (a variety of indoor and outdoor inperson activities for adults). ([phone]. [address].  -Virtual Online Classes (a variety of topics): see seniorplanet.org or call [phone]  -consider volunteering at a school, hospice center, church, senior center or elsewhere           Terressa Koyanagi, DO

## 2023-04-10 LAB — FECAL OCCULT BLOOD, IMMUNOCHEMICAL: IFOBT: NEGATIVE

## 2023-04-28 ENCOUNTER — Ambulatory Visit (INDEPENDENT_AMBULATORY_CARE_PROVIDER_SITE_OTHER): Payer: Medicare HMO

## 2023-04-28 DIAGNOSIS — Z23 Encounter for immunization: Secondary | ICD-10-CM | POA: Diagnosis not present

## 2023-05-11 ENCOUNTER — Encounter: Payer: Self-pay | Admitting: Internal Medicine

## 2023-05-25 ENCOUNTER — Ambulatory Visit: Payer: Medicare HMO | Admitting: Internal Medicine

## 2023-05-25 ENCOUNTER — Encounter: Payer: Self-pay | Admitting: Internal Medicine

## 2023-05-25 VITALS — BP 130/70 | HR 85 | Ht 61.5 in | Wt 122.6 lb

## 2023-05-25 DIAGNOSIS — E559 Vitamin D deficiency, unspecified: Secondary | ICD-10-CM | POA: Diagnosis not present

## 2023-05-25 DIAGNOSIS — M810 Age-related osteoporosis without current pathological fracture: Secondary | ICD-10-CM | POA: Diagnosis not present

## 2023-05-25 NOTE — Progress Notes (Unsigned)
Patient ID: MAALIYAH ALLOY, female   DOB: 03-Jul-1950, 72 y.o.   MRN: [id number]   HPI  STACIE CABOT is a 72 y.o.-year-old female, initially referred by her PCP, Dr. Fabian Sharp, for management of osteoporosis (OP).  Last visit 1 year and 8 months ago.  Interim history: No falls or fractures since last visit.  She sprained her L ankle ~1 year ago - getting up from a sitting position. She was told she had an ankle fracture. No dizziness/orthostasis/poor vision. She has occasional vertigo - 1-2x a year - few hours at a time. Before last visit, she switched to a mostly plant-based diet. Now added meat back. She now works out with a Psychologist, educational - weight training 2x times a week. Also stretching, walking.  Reviewed history: Pt was dx with OP in 2011.  I reviewed pt's DXA scan reports along with the pt: Date L1-L4 T score FN T score 33% distal Radius  [facility]  -3.8 RFN: -2.8 LFN: -2.9 n/a  05/27/2015 ([place])  -3.8 RFN: -2.6 LFN: -2.6 n/a  05/01/2010 [facility])  -3.7 RFN: -2.5 LFN: -2.7 n/a   No Previous OP treatments.    At last visit, we discussed about Prolia and the PA was approved, with a co-pay.  However, she declined pharmaceutical interventions afterwards.  She went to [facility] for 6 mo before the Coronavs. pandemic in 08/2018.  She has a h/o vitamin D deficiency. Reviewed available vit D levels: Lab Results  Component Value Date   VD25OH 54.29 09/18/2021   VD25OH 41.2 08/20/2020   VD25OH 45 02/07/2020   VD25OH 14.83 (L) 06/26/2019   VD25OH 21.17 (L) 04/08/2017   Pt is on: - vitamin D 5000 units daily  - Algae Cal with vit D, K2, Calcium, Strontium - started 2 mo ago  She does not take high vitamin A doses.  She has been on steroids in the past >> occasionally inj's and tapers, but not for sarcoidosis.  She was on PPIs in the past for 1 year >> stopped.  Menopause was at ~72 y/o.   FH of osteoporosis: mother in her 35s (pelvic fx. And hip  fx.)  No h/o hyper/hypocalcemia or hyperparathyroidism. No h/o kidney stones. Lab Results  Component Value Date   PTH 24 06/30/2020   CALCIUM 9.5 08/04/2022   CALCIUM 9.4 07/14/2021   CALCIUM 9.1 08/20/2020   CALCIUM 9.4 06/30/2020   CALCIUM 9.3 06/26/2019   CALCIUM 8.8 05/30/2018   CALCIUM 9.4 04/08/2017   CALCIUM 9.2 03/26/2016   CALCIUM 9.1 11/10/2015   CALCIUM 9.3 03/25/2015   No h/o thyrotoxicosis. Reviewed TSH recent levels:  Lab Results  Component Value Date   TSH 1.64 08/04/2022   TSH 1.47 07/14/2021   TSH 1.37 06/30/2020   TSH 1.53 03/26/2016   TSH 1.35 03/25/2015   No h/o CKD. 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  She sees Dr. Renaldo Reel at [facility] dermatology She has a low white blood cell count.  ROS: + see HPI + joint aches (left hip)  I reviewed pt's medications, allergies, PMH, social hx, family hx, and changes were documented in the history of present illness. Otherwise, unchanged from my initial visit note.  Past Medical History:  Diagnosis Date   Chicken pox    Lymphocytosis    Osteoporosis    Sarcoidosis 02/24/2007  Dxed 1994 by TBBX + ISLD/LN on cxr Primarily manifests as cough and skin changes Tx with Humira by derm at [facility].  PFT's 2010:  No obstruction, TLC 3.93 (87%), DLCO 89%. PFT's 2014:  No obstruction, TLC 3.07, DLCO 91%     Past Surgical History:  Procedure Laterality Date   TUBAL LIGATION  1979   UTERINE ARTERY EMBOLIZATION  2000   Social History   Socioeconomic History   Marital status: Married    Spouse name: Not on file   Number of children: 4   Years of education: Not on file   Highest education level: Not on file  Occupational History   Occupation: Self-employed  Tobacco Use   Smoking status: Never   Smokeless tobacco: Never  Vaping Use   Vaping status: Never Used  Substance and Sexual  Activity   Alcohol use: Yes    Alcohol/week: 0.0 standard drinks of alcohol    Comment: 1 drink of wine 1-2 times a week   Drug use: No   Sexual activity: Yes    Partners: Male  Other Topics Concern   Not on file  Social History Narrative   5-6 hours of sleep per night   Lives with her husband who is retired Engineer, 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   Orig from [place]   in [place] since 1988   [name]    Social Determinants of Health   Financial Resource Strain: Low Risk  (03/23/2022)   Overall Financial Resource Strain (CARDIA)    Difficulty of Paying Living Expenses: Not hard at all  Food Insecurity: No Food Insecurity (03/23/2022)   Hunger Vital Sign    Worried About Running Out of Food in the Last Year: Never true    Ran Out of Food in the Last Year: Never true  Transportation Needs: No Transportation Needs (03/23/2022)   PRAPARE - Administrator, Civil Service (Medical): No    Lack of Transportation (Non-Medical): No  Physical Activity: Sufficiently Active (03/23/2022)   Exercise Vital Sign    Days of Exercise per Week: 5 days    Minutes of Exercise per Session: 50 min  Stress: No Stress Concern Present (03/23/2022)   [employer] of Occupational Health - Occupational Stress Questionnaire    Feeling of Stress : Not at all  Social Connections: Moderately Integrated (02/03/2021)   Social Connection and Isolation Panel [NHANES]    Frequency of Communication with Friends and Family: More than three times a week    Frequency of Social Gatherings with Friends and Family: More than three times a week    Attends Religious Services: More than 4 times per year    Active Member of [employer] or Organizations: No    Attends Banker Meetings: Never    Marital Status: Married  Careers information officer Violence: Not At Risk (02/03/2021)   Humiliation, Afraid, Rape, and Kick questionnaire    Fear of Current or Ex-Partner: No    Emotionally Abused: No    Physically Abused: No    Sexually Abused: No   Current Outpatient Medications on File Prior to Visit  Medication Sig Dispense Refill   B Complex Vitamins (VITAMIN B COMPLEX PO) Take by mouth daily.  CALCIUM PO Take by mouth daily.     clobetasol cream (TEMOVATE) 0.05 % Apply topically as needed. (Patient not taking: Reported on 03/29/2023)     COLLAGEN PO Take by mouth daily.     HUMIRA PEN 40 MG/0.8ML PNKT Inject every other week for sarcoidosis     Multiple Vitamin (MULTIVITAMIN) tablet Take 1 tablet by mouth daily.     VITAMIN D PO Take 5,000 Units by mouth daily.     No current facility-administered medications on file prior to visit.   Allergies  Allergen Reactions   Amoxicillin Rash   Cholecalciferol Rash   Vitamin D Analogs Rash    high dose  Pill had rash    Family History  Problem Relation Age of Onset   Heart disease Father    Stroke Father    Dementia Father    High blood pressure Father    Dementia Mother    High blood pressure Mother    High blood pressure Sister        with hypertesnive renal disease   PE: BP 130/70   Pulse 85   Ht 5' 1.5" (1.562 m)   Wt 122 lb 9.6 oz (55.6 kg)   SpO2 96%   BMI 22.79 kg/m  Wt Readings from Last 3 Encounters:  05/25/23 122 lb 9.6 oz (55.6 kg)  03/29/23 120 lb (54.4 kg)  10/11/22 119 lb (54 kg)   Constitutional: Normal weight, in NAD. No kyphosis. Eyes:  EOMI, no exophthalmos ENT: no neck masses, no cervical lymphadenopathy Cardiovascular: RRR, No MRG Respiratory: CTA B Musculoskeletal: no deformities Skin:no rashes Neurological: no tremor with outstretched hands  Assessment: 1. Osteoporosis  2.  Vitamin D deficiency  Plan: 1. Osteoporosis -Likely postmenopausal/age-related and she also has a family history of osteoporosis -We reviewed together  her previous 3 bone density scan reports and her T-scores.  She is at high risk for fractures due to the low T-scores, under -2.5.  Also, her T-scores decreased from 2011 to 2021 but they were not completely comparable since each of the 3 scans were done on different machines.  We decided that going forward, to continue to use the machine at the breast center.  She was due for another scan after 06/09/2022, but she now returns after a longer absence and did not have a scan done recently.  We will order this today. -At last visit and again today, we discussed about pharmaceutical treatment for osteoporosis but after comprehensive discussions about overwhelming benefits versus possible side effects, she chose to manage her osteoporosis without pharmaceutical intervention.  We discussed about the mechanism of action of different osteoporosis medications and expected benefits. -We discussed again today about weightbearing exercises (she started to workout with a trainer), maintaining a normal vitamin D level, and a good amount of calcium in her diet (more than the 1000-1200 mg daily).at last visit, I suggested to continue to go to the [place] center as she was doing before the pandemic.  I also recommended adding weights during walking, exercising after meals, rather than before and walking downhill (treadmill with incline). At last visit she was not doing much exercise and I recommended to try to do weightbearing exercises 30 minutes for 5/7 days at least.  Since then, she started -She is lactose intolerant and cannot eat dairy.  She eats mostly plant-based, low acid.  This is excellent.  I did advise her to maintain a good amount of protein in the diet (approximately 45 g/day for her-0.8  g/kg/day).  At this visit, she tells me that she introduced meat. -At today's visit, we will recheck the bone density scan and then discuss about further management.  She is still reticent to start medication, however, I  advised her to think about this.  Since she is on Humira, Reclast would be preferred for her compared to Prolia. -I will see her back in a year  2.  Vitamin D deficiency -She was previously on ergocalciferol 50,000 units weekly, but she did not tolerate this well -She continues on 5000 units vitamin D daily + the amount in Algae Cal -At last visit, vitamin D level was normal at 54.29 in 08/2021 -Will recheck the level today  Carlus Pavlov, MD PhD [facility] Endocrinology

## 2023-05-25 NOTE — Patient Instructions (Addendum)
Please stop at the lab.  Please continue vitamin D 5000 units daily.  Please try to get ~1200 mg calcium, preferentially from the diet.  Please return for another visit in 1 year.

## 2023-05-26 ENCOUNTER — Encounter: Payer: Self-pay | Admitting: Internal Medicine

## 2023-05-26 DIAGNOSIS — Z5181 Encounter for therapeutic drug level monitoring: Secondary | ICD-10-CM | POA: Diagnosis not present

## 2023-05-26 DIAGNOSIS — Z0100 Encounter for examination of eyes and vision without abnormal findings: Secondary | ICD-10-CM | POA: Diagnosis not present

## 2023-05-26 DIAGNOSIS — D869 Sarcoidosis, unspecified: Secondary | ICD-10-CM | POA: Diagnosis not present

## 2023-05-26 LAB — VITAMIN D 25 HYDROXY (VIT D DEFICIENCY, FRACTURES): Vit D, 25-Hydroxy: 35 ng/mL (ref 30–100)

## 2023-06-16 DIAGNOSIS — H52223 Regular astigmatism, bilateral: Secondary | ICD-10-CM | POA: Diagnosis not present

## 2023-07-13 LAB — COLOGUARD: COLOGUARD: NEGATIVE

## 2023-07-24 ENCOUNTER — Emergency Department (HOSPITAL_BASED_OUTPATIENT_CLINIC_OR_DEPARTMENT_OTHER): Payer: PPO

## 2023-07-24 ENCOUNTER — Emergency Department (HOSPITAL_BASED_OUTPATIENT_CLINIC_OR_DEPARTMENT_OTHER)
Admission: EM | Admit: 2023-07-24 | Discharge: 2023-07-24 | Disposition: A | Discharge status: Home / Self Care | Payer: PPO | Attending: Emergency Medicine | Admitting: Emergency Medicine

## 2023-07-24 ENCOUNTER — Encounter (HOSPITAL_BASED_OUTPATIENT_CLINIC_OR_DEPARTMENT_OTHER): Payer: Self-pay

## 2023-07-24 DIAGNOSIS — R0789 Other chest pain: Secondary | ICD-10-CM | POA: Insufficient documentation

## 2023-07-24 DIAGNOSIS — R079 Chest pain, unspecified: Secondary | ICD-10-CM

## 2023-07-24 LAB — CBC
HCT: 42 % (ref 36.0–46.0)
Hemoglobin: 14.2 g/dL (ref 12.0–15.0)
MCH: 31.6 pg (ref 26.0–34.0)
MCHC: 33.8 g/dL (ref 30.0–36.0)
MCV: 93.3 fL (ref 80.0–100.0)
Platelets: 318 10*3/uL (ref 150–400)
RBC: 4.5 MIL/uL (ref 3.87–5.11)
RDW: 12.2 % (ref 11.5–15.5)
WBC: 6.4 10*3/uL (ref 4.0–10.5)
nRBC: 0 % (ref 0.0–0.2)

## 2023-07-24 LAB — TROPONIN I (HIGH SENSITIVITY)
Troponin I (High Sensitivity): 4 ng/L (ref <18)
Troponin I (High Sensitivity): 4 ng/L (ref <18)

## 2023-07-24 LAB — BASIC METABOLIC PANEL
Anion gap: 9 (ref 5–15)
BUN: 19 mg/dL (ref 8–23)
CO2: 25 mmol/L (ref 22–32)
Calcium: 9.1 mg/dL (ref 8.9–10.3)
Chloride: 103 mmol/L (ref 98–111)
Creatinine, Ser: 0.71 mg/dL (ref 0.44–1.00)
GFR, Estimated: >60 mL/min (ref >60)
Glucose, Bld: 97 mg/dL (ref 70–99)
Potassium: 3.7 mmol/L (ref 3.5–5.1)
Sodium: 137 mmol/L (ref 135–145)

## 2023-07-24 MED ORDER — ASPIRIN 81 MG PO CHEW
324.0000 mg | CHEWABLE_TABLET | Freq: Once | ORAL | Status: AC
  Administered 2023-07-24: 324 mg via ORAL
  Filled 2023-07-24: qty 4

## 2023-07-24 MED ORDER — NITROGLYCERIN 0.4 MG SL SUBL
0.4000 mg | SUBLINGUAL_TABLET | SUBLINGUAL | Status: DC | PRN
  Administered 2023-07-24: 0.4 mg via SUBLINGUAL
  Filled 2023-07-24: qty 1

## 2023-07-24 NOTE — ED Provider Notes (Signed)
[place] EMERGENCY DEPARTMENT AT [facility] Provider Note   CSN: [id number] Arrival date & time: 07/24/23  4098     History  Chief Complaint  Patient presents with   Chest Pain    Courtney Bray is a 73 y.o. female.  The history is provided by the patient.  Chest Pain She has history of sarcoidosis and complains of left-sided chest pain which was present when she went to bed.  She woke up at 2 AM with the pain still there.  She describes a squeezing type pain with occasional radiation to the back.  There is no associated dyspnea, nausea, diaphoresis.  She has had no change in her chronic cough.  She did take a hot shower noted that the pain seemed to be better at that point.  She has not taken any medication.   Home Medications Prior to Admission medications   Medication Sig Start Date End Date Taking? Authorizing Provider  B Complex Vitamins (VITAMIN B COMPLEX PO) Take by mouth daily.    [provider]  CALCIUM PO Take by mouth daily.    [provider]  clobetasol cream (TEMOVATE) 0.05 % Apply topically as needed. 10/13/16   [provider]  COLLAGEN PO Take by mouth daily.    [provider]  HUMIRA PEN 40 MG/0.8ML PNKT Inject every other week for sarcoidosis 10/09/14   Kandice Hams, MD  Multiple Vitamin (MULTIVITAMIN) tablet Take 1 tablet by mouth daily. Patient not taking: Reported on 05/25/2023    [provider]  VITAMIN D PO Take 5,000 Units by mouth daily.    [provider]      Allergies    Amoxicillin, Cholecalciferol, and Vitamin d analogs    Review of Systems   Review of Systems  Cardiovascular:  Positive for chest pain.  All other systems reviewed and are negative.   Physical Exam Updated Vital Signs BP (!) 152/72   Pulse 75   Temp 98.3 F (36.8 C) (Oral)   Resp 11   Ht 5' 1.5" (1.562 m)   Wt 54.4 kg   SpO2 95%   BMI 22.31 kg/m  Physical Exam Vitals and nursing note  reviewed.   73 year old female, resting comfortably and in no acute distress. Vital signs are significant for elevated blood pressure. Oxygen saturation is 95%, which is normal. Head is normocephalic and atraumatic. PERRLA, EOMI. Oropharynx is clear. Neck is nontender and supple without adenopathy or JVD. Back is nontender and there is no CVA tenderness. Lungs are clear without rales, wheezes, or rhonchi. Chest is nontender. Heart has regular rate and rhythm without murmur. Abdomen is soft, flat, nontender. Extremities have no cyanosis or edema, full range of motion is present. Skin is warm and dry without rash. Neurologic: Mental status is normal, cranial nerves are intact, moves all extremities equally.  ED Results / Procedures / Treatments   Labs (all labs ordered are listed, but only abnormal results are displayed) Labs Reviewed  BASIC METABOLIC PANEL  CBC  TROPONIN I (HIGH SENSITIVITY)    EKG EKG Interpretation Date/Time:  Sunday July 24 2023 03:37:55 EST Ventricular Rate:  85 PR Interval:  152 QRS Duration:  68 QT Interval:  354 QTC Calculation: 421 R Axis:   46  Text Interpretation: Normal sinus rhythm Possible Left atrial enlargement Borderline ECG No previous ECGs available Confirmed by Dione Booze ([id number]) on 07/24/2023 4:29:00 AM  Radiology DG Chest Port 1 View Result Date: 07/24/2023 CLINICAL DATA:  Chest pain for 1 week EXAM: PORTABLE CHEST 1 VIEW COMPARISON:  10/11/2022 FINDINGS: Chronic reticular and nodular opacity in the bilateral lungs. There is no edema, acute consolidation, effusion, or pneumothorax. Normal heart size and stable mediastinal contours with calcified mediastinal lymph nodes. Artifact from EKG pads. IMPRESSION: Chronic lung disease without acute superimposed finding. Electronically Signed   By: Tiburcio Pea M.D.   On: 07/24/2023 04:13    Procedures Procedures  Cardiac monitor shows normal sinus rhythm, per my  interpretation.  Medications Ordered in ED Medications - No data to display  ED Course/ Medical Decision Making/ A&P                                 Medical Decision Making Amount and/or Complexity of Data Reviewed Labs: ordered. Radiology: ordered.  Risk OTC drugs. Prescription drug management.   Chest pain of uncertain cause.  Consider ACS, musculoskeletal pain, pneumonia, pleurisy.  Doubt aortic dissection, pulmonary embolism.  I have reviewed her electrocardiogram and my interpretation is left atrial enlargement but no acute changes.  Chest x-ray shows chronic lung disease without acute changes.  I have independently viewed the images, and agree with radiologist's interpretation.  I have reviewed her laboratory test, my interpretation is normal troponin, normal CBC, normal basic metabolic panel.  I have ordered a dose of aspirin and sublingual nitroglycerin.  Repeat troponin is pending.  She had good relief of pain with aspirin and nitroglycerin.  Repeat troponin is unchanged.  No evidence of ACS.  I feel that she is safe for discharge.  However, based on age, I am referring her to cardiology for outpatient evaluation.  Final Clinical Impression(s) / ED Diagnoses Final diagnoses:  Nonspecific chest pain    Rx / DC Orders ED Discharge Orders          Ordered    Ambulatory referral to Cardiology       Comments: If you have not heard from the Cardiology office within the next 72 hours please call [phone].   07/24/23 0981              Dione Booze, MD 07/24/23 [phone]

## 2023-07-24 NOTE — ED Notes (Signed)
Pt verbalized relief of chest pain after 1 NTG.

## 2023-07-24 NOTE — ED Triage Notes (Signed)
Pt POV with Husband d/t CP intermittent for 1 week and constant since midnight.  No Cardiac Hx.

## 2023-07-24 NOTE — ED Notes (Signed)
 Provider at bedside

## 2023-07-24 NOTE — Discharge Instructions (Signed)
Your evaluation today did not show any sign of any heart problems.  However, if you have any concerns, you are welcome to return for reevaluation at any time.

## 2023-07-27 ENCOUNTER — Telehealth: Payer: Self-pay | Admitting: Internal Medicine

## 2023-07-27 NOTE — Telephone Encounter (Signed)
 Copied from CRM [id number]. Topic: General - Other >> Jul 27, 2023  2:41 PM Robinson H wrote: Reason for CRM: Patient is calling to speak with provider or nurse, patient would not give agent any information besides she would like Dr. Charlett advice on something. Agent asked patient if she had any urgent problems or symptoms and patient stated no and wouldn't give anymore information.  Kim [phone]

## 2023-07-29 NOTE — Telephone Encounter (Signed)
 Spoke to pt.   Pt reports of chest pain happened last Sunday at 2 am. Pt states her husband was away. Wasn't sure why she is having the pain. Pt states she went to ED on 07/24/2023. They advise to follow up with PCP. Also they have sent in a cardiology referral. Pt states she has not schedule an appt yet.   Pt updates she has not had any chest pain since the visit at ED. Pt would like to discuss with PCP.   Schedule pt a follow up appt on Wednesday 08/03/2023.   Forwarding to provider for [facility]

## 2023-08-03 ENCOUNTER — Ambulatory Visit: Payer: PPO | Admitting: Internal Medicine

## 2023-08-03 ENCOUNTER — Encounter: Payer: Self-pay | Admitting: Internal Medicine

## 2023-08-03 VITALS — BP 140/68 | HR 86 | Temp 97.8°F | Ht 61.5 in | Wt 120.8 lb

## 2023-08-03 DIAGNOSIS — R079 Chest pain, unspecified: Secondary | ICD-10-CM

## 2023-08-03 DIAGNOSIS — D860 Sarcoidosis of lung: Secondary | ICD-10-CM | POA: Diagnosis not present

## 2023-08-03 NOTE — Progress Notes (Signed)
Chief Complaint  Patient presents with   Follow-up    Pt reports she is follow up from ED visit. Pt would likke to discuss if she should see a cardiologist.     HPI: Courtney Bray 73 y.o. come in for appt "fu from ED"  2/2 25where she [resented with CP and was referred to cardiology as OP but advised to see PCP also  Cp awake her was left sided squeezing and radiating to ack cp with no assoc sx and better after hot shower Troponin neg  c xray no acuyte changes  Not felt to be acs   and dc after as a and ntg  Was in Weight training 2 x per week had upped  weights no pain with activity then   and then 2 days later had  pain with chest and back mostly left side  .  And then backed off.  Then still having upper chest  movement pain . Ok today  "May have over done  this weight training ."  And not sure   if important and that  night  continued sx.  Planning  to restart .  ROS: See pertinent positives and negatives per HPI. No current sob cough cp today Bp usually ok at home Past Medical History:  Diagnosis Date   Chicken pox    Lymphocytosis    Osteoporosis    Sarcoidosis 02/24/2007   Dxed 1994 by TBBX + ISLD/LN on cxr Primarily manifests as cough and skin changes Tx with Humira by derm at [facility].  PFT's 2010:  No obstruction, TLC 3.93 (87%), DLCO 89%. PFT's 2014:  No obstruction, TLC 3.07, DLCO 91%      Family History  Problem Relation Age of Onset   Heart disease Father    Stroke Father    Dementia Father    High blood pressure Father    Dementia Mother    High blood pressure Mother    High blood pressure Sister        with hypertesnive renal disease    Social History   Socioeconomic History   Marital status: Married    Spouse name: Not on file   Number of children: 4   Years of education: Not on file   Highest education level: 12th grade  Occupational History   Occupation: Self-employed  Tobacco Use   Smoking status: Never   Smokeless tobacco: Never  Vaping Use    Vaping status: Never Used  Substance and Sexual Activity   Alcohol use: Yes    Alcohol/week: 0.0 standard drinks of alcohol    Comment: 1 drink of wine 1-2 times a week   Drug use: No   Sexual activity: Yes    Partners: Male  Other Topics Concern   Not on file  Social History Narrative   5-6 hours of sleep per night   Lives with her husband who is retired Engineer, 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   Orig from [place]   in [place] since 1988   [name]    Social Drivers of Health   Financial Resource Strain: Low Risk  (08/02/2023)   Overall Financial Resource Strain (CARDIA)    Difficulty of Paying Living Expenses: Not hard at  all  Food Insecurity: No Food Insecurity (08/02/2023)   Hunger Vital Sign    Worried About Running Out of Food in the Last Year: Never true    Ran Out of Food in the Last Year: Never true  Transportation Needs: No Transportation Needs (08/02/2023)   PRAPARE - Administrator, Civil Service (Medical): No    Lack of Transportation (Non-Medical): No  Physical Activity: Insufficiently Active (08/02/2023)   Exercise Vital Sign    Days of Exercise per Week: 4 days    Minutes of Exercise per Session: 30 min  Stress: No Stress Concern Present (08/02/2023)   [employer] of Occupational Health - Occupational Stress Questionnaire    Feeling of Stress : Not at all  Social Connections: Socially Integrated (08/02/2023)   Social Connection and Isolation Panel [NHANES]    Frequency of Communication with Friends and Family: More than three times a week    Frequency of Social Gatherings with Friends and Family: Three times a week    Attends Religious Services: More than 4 times per year    Active Member of Clubs or Organizations: Yes    Attends Banker Meetings: More than 4  times per year    Marital Status: Married    Outpatient Medications Prior to Visit  Medication Sig Dispense Refill   CALCIUM PO Take by mouth daily.     clobetasol cream (TEMOVATE) 0.05 % Apply topically as needed.     COLLAGEN PO Take by mouth daily.     HUMIRA PEN 40 MG/0.8ML PNKT Inject every other week for sarcoidosis     Multiple Vitamin (MULTIVITAMIN) tablet Take 1 tablet by mouth daily.     VITAMIN D PO Take 5,000 Units by mouth daily.     B Complex Vitamins (VITAMIN B COMPLEX PO) Take by mouth daily.     No facility-administered medications prior to visit.     EXAM:  BP (!) 140/68 (BP Location: Left Arm, Patient Position: Sitting, Cuff Size: Normal) Comment: recheck  Pulse 86   Temp 97.8 F (36.6 C) (Oral)   Ht 5' 1.5" (1.562 m)   Wt 120 lb 12.8 oz (54.8 kg)   SpO2 98%   BMI 22.46 kg/m   Body mass index is 22.46 kg/m.  GENERAL: vitals reviewed and listed above, alert, oriented, appears well hydrated and in no acute distress HEENT: atraumatic, conjunctiva  clear, no obvious abnormalities on inspection of external nose and ears  NECK: no obvious masses on inspection palpation  LUNGS: clear to auscultation bilaterally, no wheezes, rales or rhonchi, good air movement no obv cw tenderness  CV: HRRR, no clubbing cyanosis or  peripheral edema nl cap refill  MS: moves all extremities without noticeable focal  abnormality PSYCH: pleasant and cooperative, no obvious depression or anxiety Lab Results  Component Value Date   WBC 6.4 07/24/2023   HGB 14.2 07/24/2023   HCT 42.0 07/24/2023   PLT 318 07/24/2023   GLUCOSE 97 07/24/2023   CHOL 212 (H) 08/04/2022   TRIG 59.0 08/04/2022   HDL 73.10 08/04/2022   LDLCALC 127 (H) 08/04/2022   ALT 34 08/04/2022   AST 27 08/04/2022   NA 137 07/24/2023   K 3.7 07/24/2023   CL 103 07/24/2023   CREATININE 0.71 07/24/2023   BUN 19 07/24/2023   CO2 25 07/24/2023   TSH 1.64 08/04/2022   HGBA1C 5.3 08/04/2022   BP Readings from  Last 3 Encounters:  08/03/23 (!) 140/68  07/24/23  127/69  05/25/23 130/70   ED visit reviewed  ASSESSMENT AND PLAN:  Discussed the following assessment and plan:  Chest pain, unspecified type - FU ED visit evaluation - Plan: Ambulatory referral to Cardiology  Sarcoidosis, lung (HCC) Pain seemed to be related  to poss weight training but  became intensified and was of concern and she is planning to reinstate some training .   No hx of premature cv disease in family bu father did have bypass and cva .  Will get cardiology evaluation  as to cv risk assessment  may be a candidate for ct coronary flow  assessment.  BP to be controlled .   -Patient advised to return or notify health care team  if  new concerns arise.  Patient Instructions  Chest pain may have been  related  to  MS causes or other and  caused     Plan:  will do non urgent referral  .   For cardiology assessment .      Neta Mends. Donjuan Robison M.D.

## 2023-08-03 NOTE — Patient Instructions (Signed)
Chest pain may have been  related  to  MS causes or other and  caused     Plan:  will do non urgent referral  .   For cardiology assessment .

## 2023-11-01 NOTE — Progress Notes (Unsigned)
 Cardiology Office Note:    Date:  11/02/2023   ID:  Courtney Bray, DOB 09-08-1950, MRN [id number]  PCP:  Reginal Capra, MD  Cardiologist:  None  Electrophysiologist:  None   Referring MD: Reginal Capra, MD   Chief Complaint  Patient presents with   Chest Pain    History of Present Illness:    Courtney Bray is a 73 y.o. female with a hx of sarcoidosis who is referred by Dr. Ethel Henry for evaluation of chest pain.  She reports that in February 2025 she started having discomfort in her chest, more on the left side.  Describes dull aching pain, states it woke her up from sleep.  Pain lasted hours.  She went to the ED, workup unremarkable.  Has not noted exertional chest pain.  She denies shortness of breath.  She denies any lightheadedness, syncope, lower extremity edema, or palpitations.  No smoking history.  Family history includes father had CABG in 9s, had multiple CVAs.  Past Medical History:  Diagnosis Date   Chicken pox    Lymphocytosis    Osteoporosis    Sarcoidosis 02/24/2007   Dxed 1994 by TBBX + ISLD/LN on cxr Primarily manifests as cough and skin changes Tx with Humira by derm at [facility].  PFT's 2010:  No obstruction, TLC 3.93 (87%), DLCO 89%. PFT's 2014:  No obstruction, TLC 3.07, DLCO 91%      Past Surgical History:  Procedure Laterality Date   TUBAL LIGATION  1979   UTERINE ARTERY EMBOLIZATION  2000    Current Medications: Current Meds  Medication Sig   CALCIUM PO Take by mouth daily.   clobetasol cream (TEMOVATE) 0.05 % Apply topically as needed.   COLLAGEN PO Take by mouth daily.   HUMIRA PEN 40 MG/0.8ML PNKT Inject every other week for sarcoidosis   metoprolol tartrate (LOPRESSOR) 100 MG tablet Take 1 tablet (100 mg total) by mouth once for 1 dose. Take two hours prior to ct   VITAMIN D  PO Take 5,000 Units by mouth daily.     Allergies:   Amoxicillin , Cholecalciferol, and Vitamin d  analogs   Social History   Socioeconomic History   Marital status:  Married    Spouse name: Not on file   Number of children: 4   Years of education: Not on file   Highest education level: 12th grade  Occupational History   Occupation: Self-employed  Tobacco Use   Smoking status: Never   Smokeless tobacco: Never  Vaping Use   Vaping status: Never Used  Substance and Sexual Activity   Alcohol use: Yes    Alcohol/week: 0.0 standard drinks of alcohol    Comment: 1 drink of wine 1-2 times a week   Drug use: No   Sexual activity: Yes    Partners: Male  Other Topics Concern   Not on file  Social History Narrative   5-6 hours of sleep per night   Lives with her husband who is retired Engineer, 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   Orig from [place]   in [place] since 1988   [name]    Social Drivers of Health   Financial Resource Strain: Low Risk  (08/02/2023)   Overall Physicist, medical Strain (  CARDIA)    Difficulty of Paying Living Expenses: Not hard at all  Food Insecurity: No Food Insecurity (08/02/2023)   Hunger Vital Sign    Worried About Running Out of Food in the Last Year: Never true    Ran Out of Food in the Last Year: Never true  Transportation Needs: No Transportation Needs (08/02/2023)   PRAPARE - Administrator, Civil Service (Medical): No    Lack of Transportation (Non-Medical): No  Physical Activity: Insufficiently Active (08/02/2023)   Exercise Vital Sign    Days of Exercise per Week: 4 days    Minutes of Exercise per Session: 30 min  Stress: No Stress Concern Present (08/02/2023)   [employer] of Occupational Health - Occupational Stress Questionnaire    Feeling of Stress : Not at all  Social Connections: Socially Integrated (08/02/2023)   Social Connection and Isolation Panel [NHANES]    Frequency of Communication with Friends and Family:  More than three times a week    Frequency of Social Gatherings with Friends and Family: Three times a week    Attends Religious Services: More than 4 times per year    Active Member of Clubs or Organizations: Yes    Attends Engineer, structural: More than 4 times per year    Marital Status: Married     Family History: The patient's family history includes Dementia in her father and mother; Heart disease in her father; High blood pressure in her father, mother, and sister; Stroke in her father.  ROS:   Please see the history of present illness.     All other systems reviewed and are negative.  EKGs/Labs/Other Studies Reviewed:    The following studies were reviewed today:   EKG:  EKG is not ordered today.  The ekg ordered 07/24/23 showed normal sinus rhythm, rate 85, no ST abnormalities  Recent Labs: 07/24/2023: BUN 19; Creatinine, Ser 0.71; Hemoglobin 14.2; Platelets 318; Potassium 3.7; Sodium 137  Recent Lipid Panel    Component Value Date/Time   CHOL 212 (H) 08/04/2022 1151   TRIG 59.0 08/04/2022 1151   HDL 73.10 08/04/2022 1151   CHOLHDL 3 08/04/2022 1151   VLDL 11.8 08/04/2022 1151   LDLCALC 127 (H) 08/04/2022 1151    Physical Exam:    VS:  BP 132/86   Pulse 86   Ht 5' 1.5" (1.562 m)   Wt 123 lb 14.4 oz (56.2 kg)   SpO2 96%   BMI 23.03 kg/m     Wt Readings from Last 3 Encounters:  11/02/23 123 lb 14.4 oz (56.2 kg)  08/03/23 120 lb 12.8 oz (54.8 kg)  07/24/23 120 lb (54.4 kg)     GEN:  Well nourished, well developed in no acute distress HEENT: Normal NECK: No JVD; No carotid bruits LYMPHATICS: No lymphadenopathy CARDIAC: RRR, no murmurs, rubs, gallops RESPIRATORY:  Clear to auscultation without rales, wheezing or rhonchi  ABDOMEN: Soft, non-tender, non-distended MUSCULOSKELETAL:  No edema; No deformity  SKIN: Warm and dry NEUROLOGIC:  Alert and oriented x 3 PSYCHIATRIC:  Normal affect   ASSESSMENT:    1. Precordial pain   2. Sarcoidosis   3.  Hyperlipidemia, unspecified hyperlipidemia type    PLAN:    Chest pain: Atypical in description, does have CAD risk factors (age, family history, hyperlipidemia).  Recommend coronary CTA to rule out obstructive CAD.  Will give Lopressor 100 mg prior to study.  Sarcoidosis: Has had pulmonary and skin involvement, on Humira.  Recommend echocardiogram  to evaluate for cardiac involvement  Hyperlipidemia: LDL 127 07/2022.  Will update lipid panel and follow-up results coronary CTA to guide how aggressive to be in lowering cholesterol  RTC in 6 months   Medication Adjustments/Labs and Tests Ordered: Current medicines are reviewed at length with the patient today.  Concerns regarding medicines are outlined above.  Orders Placed This Encounter  Procedures   CT CORONARY MORPH W/CTA COR W/SCORE W/CA W/CM &/OR WO/CM   Basic metabolic panel with GFR   Lipid panel   ECHOCARDIOGRAM COMPLETE   Meds ordered this encounter  Medications   metoprolol tartrate (LOPRESSOR) 100 MG tablet    Sig: Take 1 tablet (100 mg total) by mouth once for 1 dose. Take two hours prior to ct    Dispense:  1 tablet    Refill:  0    Patient Instructions  Medication Instructions:  Your physician has recommended you make the following change in your medication:   START Lopressor 100 mg two hours prior to CT  Lab Work: Your physician recommends that you return for lab work: BMET and LIPIDS  Testing/Procedures: Your physician has requested that you have an echocardiogram. Echocardiography is a painless test that uses sound waves to create images of your heart. It provides your doctor with information about the size and shape of your heart and how well your heart's chambers and valves are working. This procedure takes approximately one hour. There are no restrictions for this procedure. Please do NOT wear cologne, perfume, aftershave, or lotions (deodorant is allowed). Please arrive 15 minutes prior to your appointment  time.  Please note: We ask at that you not bring children with you during ultrasound (echo/ vascular) testing. Due to room size and safety concerns, children are not allowed in the ultrasound rooms during exams. Our front office staff cannot provide observation of children in our lobby area while testing is being conducted. An adult accompanying a patient to their appointment will only be allowed in the ultrasound room at the discretion of the ultrasound technician under special circumstances. We apologize for any inconvenience.      Your cardiac CT will be scheduled at one of the below locations:   [facility] [address] [place], [place] [address] [phone]  OR   Jeralene Mom. [facility] and Vascular Tower [address]  [place], [place] [address] Opening October 17, 2023  If scheduled at [facility], please arrive at the [facility] and Children's Entrance (Entrance C2) of [facility] 30 minutes prior to test start time. You can use the FREE valet parking offered at entrance C (encouraged to control the heart rate for the test)  Proceed to the [facility] Radiology Department (first floor) to check-in and test prep.   All radiology patients and guests should use entrance C2 at [facility], accessed from [facility], even though the hospital's physical address listed is [address]    If scheduled at the Heart and Vascular Tower at [address], please enter the parking lot using the [address] entrance and use the FREE valet service at the patient drop-off area. Enter the buidling and check-in with registration on the main floor.  If scheduled at [facility] or [facility], please arrive 15 mins early for check-in and test prep.  There is spacious parking and easy access to the radiology department from the [facility] Heart and Vascular entrance. Please enter here and  check-in with the desk attendant.   If scheduled at [facility], please arrive 30 minutes early for check-in and test prep.  Please follow these instructions carefully (unless otherwise directed):  An IV will be required for this test and Nitroglycerin  will be given.  Hold all erectile dysfunction medications at least 3 days (72 hrs) prior to test. (Ie viagra, cialis, sildenafil, tadalafil, etc)   On the Night Before the Test: Be sure to Drink plenty of water. Do not consume any caffeinated/decaffeinated beverages or chocolate 12 hours prior to your test. Do not take any antihistamines 12 hours prior to your test.  On the Day of the Test: Drink plenty of water until 1 hour prior to the test. Do not eat any food 1 hour prior to test. You may take your regular medications prior to the test.  Take metoprolol (Lopressor) two hours prior to test. If you take Furosemide/Hydrochlorothiazide/Spironolactone/Chlorthalidone, please HOLD on the morning of the test. Patients who wear a continuous glucose monitor MUST remove the device prior to scanning. FEMALES- please wear underwire-free bra if available, avoid dresses & tight clothing  After the Test: Drink plenty of water. After receiving IV contrast, you may experience a mild flushed feeling. This is normal. On occasion, you may experience a mild rash up to 24 hours after the test. This is not dangerous. If this occurs, you can take Benadryl 25 mg, Zyrtec, Claritin, or Allegra and increase your fluid intake. (Patients taking Tikosyn should avoid Benadryl, and may take Zyrtec, Claritin, or Allegra) If you experience trouble breathing, this can be serious. If it is severe call 911 IMMEDIATELY. If it is mild, please call our office.  We will call to schedule your test 2-4 weeks out understanding that some insurance companies will need an authorization prior to the service being performed.   For more information and frequently asked  questions, please visit our website : [email or web address]  For non-scheduling related questions, please contact the cardiac imaging nurse navigator should you have any questions/concerns: Cardiac Imaging Nurse Navigators Direct Office Dial: [phone]   For scheduling needs, including cancellations and rescheduling, please call [place], [phone].   Follow-Up: At [facility], you and your health needs are our priority.  As part of our continuing mission to provide you with exceptional heart care, our providers are all part of one team.  This team includes your primary Cardiologist (physician) and Advanced Practice Providers or APPs (Physician Assistants and Nurse Practitioners) who all work together to provide you with the care you need, when you need it.  Your next appointment:   6 month(s) with Dr. Alda Amas  We recommend signing up for the patient portal called "MyChart".  Sign up information is provided on this After Visit Summary.  MyChart is used to connect with patients for Virtual Visits (Telemedicine).  Patients are able to view lab/test results, encounter notes, upcoming appointments, etc.  Non-urgent messages can be sent to your provider as well.   To learn more about what you can do with MyChart, go to [email or web address].    Signed, Wendie Hamburg, MD  11/02/2023 9:30 AM    [facility]

## 2023-11-02 ENCOUNTER — Ambulatory Visit (HOSPITAL_BASED_OUTPATIENT_CLINIC_OR_DEPARTMENT_OTHER): Payer: PPO | Admitting: Cardiology

## 2023-11-02 VITALS — BP 132/86 | HR 86 | Ht 61.5 in | Wt 123.9 lb

## 2023-11-02 DIAGNOSIS — D869 Sarcoidosis, unspecified: Secondary | ICD-10-CM | POA: Diagnosis not present

## 2023-11-02 DIAGNOSIS — E785 Hyperlipidemia, unspecified: Secondary | ICD-10-CM | POA: Diagnosis not present

## 2023-11-02 DIAGNOSIS — R072 Precordial pain: Secondary | ICD-10-CM | POA: Diagnosis not present

## 2023-11-02 MED ORDER — METOPROLOL TARTRATE 100 MG PO TABS
100.0000 mg | ORAL_TABLET | Freq: Once | ORAL | 0 refills | Status: AC
Start: 2023-11-02 — End: 2023-11-02

## 2023-11-02 NOTE — Patient Instructions (Addendum)
 Medication Instructions:  Your physician has recommended you make the following change in your medication:   START Lopressor 100 mg two hours prior to CT  Lab Work: Your physician recommends that you return for lab work: BMET and LIPIDS  Testing/Procedures: Your physician has requested that you have an echocardiogram. Echocardiography is a painless test that uses sound waves to create images of your heart. It provides your doctor with information about the size and shape of your heart and how well your heart's chambers and valves are working. This procedure takes approximately one hour. There are no restrictions for this procedure. Please do NOT wear cologne, perfume, aftershave, or lotions (deodorant is allowed). Please arrive 15 minutes prior to your appointment time.  Please note: We ask at that you not bring children with you during ultrasound (echo/ vascular) testing. Due to room size and safety concerns, children are not allowed in the ultrasound rooms during exams. Our front office staff cannot provide observation of children in our lobby area while testing is being conducted. An adult accompanying a patient to their appointment will only be allowed in the ultrasound room at the discretion of the ultrasound technician under special circumstances. We apologize for any inconvenience.      Your cardiac CT will be scheduled at one of the below locations:   [facility] [address] [place], [place] [address] [phone]  OR   Jeralene Mom. [facility] and Vascular Tower [address]  [place], [place] [address] Opening October 17, 2023  If scheduled at [facility], please arrive at the [facility] and Children's Entrance (Entrance C2) of [facility] 30 minutes prior to test start time. You can use the FREE valet parking offered at entrance C (encouraged to control the heart rate for the test)  Proceed to the [facility] Radiology Department (first floor) to  check-in and test prep.   All radiology patients and guests should use entrance C2 at [facility], accessed from [facility], even though the hospital's physical address listed is [address]    If scheduled at the Heart and Vascular Tower at [address], please enter the parking lot using the [address] entrance and use the FREE valet service at the patient drop-off area. Enter the buidling and check-in with registration on the main floor.  If scheduled at [facility] or [facility], please arrive 15 mins early for check-in and test prep.  There is spacious parking and easy access to the radiology department from the [facility] Heart and Vascular entrance. Please enter here and check-in with the desk attendant.   If scheduled at [facility], please arrive 30 minutes early for check-in and test prep.  Please follow these instructions carefully (unless otherwise directed):  An IV will be required for this test and Nitroglycerin  will be given.  Hold all erectile dysfunction medications at least 3 days (72 hrs) prior to test. (Ie viagra, cialis, sildenafil, tadalafil, etc)   On the Night Before the Test: Be sure to Drink plenty of water. Do not consume any caffeinated/decaffeinated beverages or chocolate 12 hours prior to your test. Do not take any antihistamines 12 hours prior to your test.  On the Day of the Test: Drink plenty of water until 1 hour prior to the test. Do not eat any food 1 hour prior to test. You may take your regular medications prior to the test.  Take metoprolol (Lopressor) two  hours prior to test. If you take Furosemide/Hydrochlorothiazide/Spironolactone/Chlorthalidone, please HOLD on the morning of the test. Patients who wear a continuous glucose monitor MUST remove the device prior to scanning. FEMALES- please wear underwire-free bra if available, avoid dresses & tight  clothing  After the Test: Drink plenty of water. After receiving IV contrast, you may experience a mild flushed feeling. This is normal. On occasion, you may experience a mild rash up to 24 hours after the test. This is not dangerous. If this occurs, you can take Benadryl 25 mg, Zyrtec, Claritin, or Allegra and increase your fluid intake. (Patients taking Tikosyn should avoid Benadryl, and may take Zyrtec, Claritin, or Allegra) If you experience trouble breathing, this can be serious. If it is severe call 911 IMMEDIATELY. If it is mild, please call our office.  We will call to schedule your test 2-4 weeks out understanding that some insurance companies will need an authorization prior to the service being performed.   For more information and frequently asked questions, please visit our website : [email or web address]  For non-scheduling related questions, please contact the cardiac imaging nurse navigator should you have any questions/concerns: Cardiac Imaging Nurse Navigators Direct Office Dial: [phone]   For scheduling needs, including cancellations and rescheduling, please call [place], [phone].   Follow-Up: At [facility], you and your health needs are our priority.  As part of our continuing mission to provide you with exceptional heart care, our providers are all part of one team.  This team includes your primary Cardiologist (physician) and Advanced Practice Providers or APPs (Physician Assistants and Nurse Practitioners) who all work together to provide you with the care you need, when you need it.  Your next appointment:   6 month(s) with Dr. Alda Amas  We recommend signing up for the patient portal called "MyChart".  Sign up information is provided on this After Visit Summary.  MyChart is used to connect with patients for Virtual Visits (Telemedicine).  Patients are able to view lab/test results, encounter notes, upcoming appointments, etc.   Non-urgent messages can be sent to your provider as well.   To learn more about what you can do with MyChart, go to [email or web address].

## 2023-11-03 ENCOUNTER — Ambulatory Visit: Payer: Self-pay | Admitting: Cardiology

## 2023-11-03 LAB — BASIC METABOLIC PANEL WITH GFR
BUN/Creatinine Ratio: 22 (ref 12–28)
BUN: 16 mg/dL (ref 8–27)
CO2: 22 mmol/L (ref 20–29)
Calcium: 9.7 mg/dL (ref 8.7–10.3)
Chloride: 102 mmol/L (ref 96–106)
Creatinine, Ser: 0.72 mg/dL (ref 0.57–1.00)
Glucose: 86 mg/dL (ref 70–99)
Potassium: 4.2 mmol/L (ref 3.5–5.2)
Sodium: 140 mmol/L (ref 134–144)
eGFR: 89 mL/min/{1.73_m2} (ref >59)

## 2023-11-03 LAB — LIPID PANEL
Chol/HDL Ratio: 2.9 ratio (ref 0.0–4.4)
Cholesterol, Total: 187 mg/dL (ref 100–199)
HDL: 64 mg/dL (ref >39)
LDL Chol Calc (NIH): 110 mg/dL — ABNORMAL HIGH (ref 0–99)
Triglycerides: 68 mg/dL (ref 0–149)
VLDL Cholesterol Cal: 13 mg/dL (ref 5–40)

## 2023-11-08 ENCOUNTER — Ambulatory Visit (INDEPENDENT_AMBULATORY_CARE_PROVIDER_SITE_OTHER)

## 2023-11-08 DIAGNOSIS — R072 Precordial pain: Secondary | ICD-10-CM | POA: Diagnosis not present

## 2023-11-08 LAB — ECHOCARDIOGRAM COMPLETE
AV Vena cont: 0.12 cm
Area-P 1/2: 3.12 cm2
P 1/2 time: 411 ms
S' Lateral: 1.47 cm

## 2023-11-21 ENCOUNTER — Encounter (HOSPITAL_COMMUNITY): Payer: Self-pay

## 2023-11-22 ENCOUNTER — Telehealth (HOSPITAL_COMMUNITY): Payer: Self-pay | Admitting: *Deleted

## 2023-11-22 NOTE — Telephone Encounter (Signed)

## 2023-11-23 ENCOUNTER — Ambulatory Visit (HOSPITAL_COMMUNITY)
Admission: RE | Admit: 2023-11-23 | Discharge: 2023-11-23 | Disposition: A | Discharge status: Home / Self Care | Source: Ambulatory Visit | Attending: Cardiology | Admitting: Cardiology

## 2023-11-23 DIAGNOSIS — R072 Precordial pain: Secondary | ICD-10-CM

## 2023-11-23 MED ORDER — NITROGLYCERIN 0.4 MG SL SUBL: 0.8000 mg | SUBLINGUAL_TABLET | Freq: Once | SUBLINGUAL | Status: DC

## 2023-11-23 MED ORDER — IOHEXOL 350 MG/ML SOLN
100.0000 mL | Freq: Once | INTRAVENOUS | Status: AC | PRN
  Administered 2023-11-23: 100 mL via INTRAVENOUS

## 2023-11-23 MED ORDER — NITROGLYCERIN 0.4 MG SL SUBL
SUBLINGUAL_TABLET | SUBLINGUAL | Status: AC
  Filled 2023-11-23: qty 2

## 2024-03-07 ENCOUNTER — Ambulatory Visit (INDEPENDENT_AMBULATORY_CARE_PROVIDER_SITE_OTHER): Admitting: Pulmonary Disease

## 2024-03-07 VITALS — BP 124/81 | HR 88 | Ht 61.5 in | Wt 121.6 lb

## 2024-03-07 DIAGNOSIS — D863 Sarcoidosis of skin: Secondary | ICD-10-CM

## 2024-03-07 DIAGNOSIS — D869 Sarcoidosis, unspecified: Secondary | ICD-10-CM

## 2024-03-07 DIAGNOSIS — R058 Other specified cough: Secondary | ICD-10-CM

## 2024-03-07 NOTE — Progress Notes (Signed)
 Courtney Bray    [id number]    Mar 10, 1951  Primary Care Physician:Panosh, Apolinar POUR, MD  Referring Physician: Charlett Apolinar POUR, MD [address] [place],  [place] [address]  Chief complaint:   Patient with a history of sarcoidosis In for follow-up  HPI:  Patient with a past history of sarcoidosis Has been stable  Not having significant symptoms at present Was following up with Dr. Danell in the past  Chronic intermittent cough, no shortness of breath Very active, exercises regularly  On Humira for skin involvement with sarcoidosis Follows up at [facility]  No chest pressure, no new skin rash Follows up with eye doctor  Cardiology evaluation recently was fine  No irregular heartbeats,  Outpatient Encounter Medications as of 03/07/2024  Medication Sig   CALCIUM PO Take by mouth daily.   clobetasol cream (TEMOVATE) 0.05 % Apply topically as needed.   COLLAGEN PO Take by mouth daily.   HUMIRA PEN 40 MG/0.8ML PNKT Inject every other week for sarcoidosis   VITAMIN D  PO Take 5,000 Units by mouth daily.   metoprolol  tartrate (LOPRESSOR ) 100 MG tablet Take 1 tablet (100 mg total) by mouth once for 1 dose. Take two hours prior to ct (Patient not taking: Reported on 03/07/2024)   Multiple Vitamin (MULTIVITAMIN) tablet Take 1 tablet by mouth daily.   No facility-administered encounter medications on file as of 03/07/2024.    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  PFT's 2010:  No obstruction, TLC 3.93 (87%), DLCO 89%. PFT's 2014:  No obstruction, TLC 3.07, DLCO 91%      Past Surgical  History:  Procedure Laterality Date   TUBAL LIGATION  1979   UTERINE ARTERY EMBOLIZATION  2000    Family History  Problem Relation Age of Onset   Heart disease Father    Stroke Father    Dementia Father    High blood pressure Father    Dementia Mother    High blood pressure Mother    High blood pressure Sister        with hypertesnive renal disease    Social History   Socioeconomic History   Marital status: Married    Spouse name: Not on file   Number of children: 4   Years of education: Not on file   Highest education level: 12th grade  Occupational History   Occupation: Self-employed  Tobacco Use   Smoking status: Never   Smokeless tobacco: Never  Vaping Use   Vaping status: Never Used  Substance and Sexual Activity   Alcohol use: Yes    Alcohol/week: 0.0 standard drinks of alcohol    Comment: 1 drink of wine 1-2 times a week   Drug use: No   Sexual activity: Yes    Partners: Male  Other Topics Concern   Not on file  Social History Narrative   5-6 hours of sleep per night   Lives with her husband who is retired Engineer, agricultural business   Has 4 children and all have moved out  Does not work outside the home. 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   Orig from [place]   in [place] since 1988   [name]    Social Drivers of Health   Financial Resource Strain: Low Risk  (08/02/2023)   Overall Financial Resource Strain (CARDIA)    Difficulty of Paying Living Expenses: Not hard at all  Food Insecurity: No Food Insecurity (08/02/2023)   Hunger Vital Sign    Worried About Running Out of Food in the Last Year: Never true    Ran Out of Food in the Last Year: Never true  Transportation Needs: No Transportation Needs (08/02/2023)   PRAPARE - Administrator, Civil Service (Medical): No    Lack of Transportation (Non-Medical): No  Physical Activity: Insufficiently Active  (08/02/2023)   Exercise Vital Sign    Days of Exercise per Week: 4 days    Minutes of Exercise per Session: 30 min  Stress: No Stress Concern Present (08/02/2023)   [employer] of Occupational Health - Occupational Stress Questionnaire    Feeling of Stress : Not at all  Social Connections: Socially Integrated (08/02/2023)   Social Connection and Isolation Panel    Frequency of Communication with Friends and Family: More than three times a week    Frequency of Social Gatherings with Friends and Family: Three times a week    Attends Religious Services: More than 4 times per year    Active Member of Clubs or Organizations: Yes    Attends Banker Meetings: More than 4 times per year    Marital Status: Married  Catering manager Violence: Not At Risk (02/03/2021)   Humiliation, Afraid, Rape, and Kick questionnaire    Fear of Current or Ex-Partner: No    Emotionally Abused: No    Physically Abused: No    Sexually Abused: No    Review of Systems  Respiratory:  Negative for cough and shortness of breath.   Musculoskeletal:  Negative for arthralgias.  All other systems reviewed and are negative.   Vitals:   03/07/24 1509  BP: 124/81  Pulse: 88  SpO2: 97%     Physical Exam Constitutional:      Appearance: Normal appearance.  HENT:     Head: Normocephalic and atraumatic.  Eyes:     General: No scleral icterus. Cardiovascular:     Rate and Rhythm: Normal rate and regular rhythm.     Heart sounds: No murmur heard.    No friction rub.  Pulmonary:     Effort: Pulmonary effort is normal. No respiratory distress.     Breath sounds: Normal breath sounds. No stridor. No wheezing or rhonchi.  Neurological:     General: No focal deficit present.     Mental Status: She is alert.  Psychiatric:        Mood and Affect: Mood normal.    Data Reviewed: Previous CT from 2004 reviewed-film not available but report reviewed  Recent chest x-ray in 2025 shows chronic  changes, no acute infiltrate  PFT from 2014 was within normal limit  Assessment:  Sarcoidosis X-rays remained stable - Patient remains very active with no significant symptoms  Mild intermittent cough - Not limiting - No worsening  Sarcoidosis affecting the skin -Has been on Humira  Plan/Recommendations: Follow-up a year from now  Encouraged to call with any  significant concerns  Follow-up with eye doctor  Remains on Humira for skin involvement  Encouraged to give us  a call with any significant changes or symptoms  Jennet Epley MD [facility] 03/07/2024, 3:27 PM  CC: Panosh, Apolinar POUR, MD

## 2024-03-07 NOTE — Patient Instructions (Signed)
 Continue to stay active  Will follow-up a year from now  Call us  with significant concerns  Your last chest x-ray February 2025 was unchanged from previous, stable chronic changes  Make sure you are keeping up with following up with the eye doctor  If you have any symptoms that are unusual let us  know

## 2024-05-14 ENCOUNTER — Ambulatory Visit (INDEPENDENT_AMBULATORY_CARE_PROVIDER_SITE_OTHER)

## 2024-05-14 VITALS — Ht 61.5 in | Wt 121.0 lb

## 2024-05-14 DIAGNOSIS — Z0000 Encounter for general adult medical examination without abnormal findings: Secondary | ICD-10-CM | POA: Diagnosis not present

## 2024-05-14 DIAGNOSIS — Z1231 Encounter for screening mammogram for malignant neoplasm of breast: Secondary | ICD-10-CM

## 2024-05-14 NOTE — Progress Notes (Signed)
 Chief Complaint  Patient presents with   Medicare Wellness     Subjective:   Courtney Bray is a 73 y.o. female who presents for a Medicare Annual Wellness Visit.  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  PFT's 2010:  No obstruction, TLC 3.93 (87%), DLCO 89%. PFT's 2014:  No obstruction, TLC 3.07, DLCO 91%     Past Surgical History:  Procedure Laterality Date   TUBAL LIGATION  1979   UTERINE ARTERY EMBOLIZATION  2000   Family History  Problem Relation Age of Onset   Heart disease Father    Stroke Father    Dementia Father    High blood pressure Father    Dementia Mother    High blood pressure Mother    High blood pressure Sister        with hypertesnive renal disease   Social History   Occupational History   Occupation: Self-employed  Tobacco Use   Smoking status: Never   Smokeless tobacco: Never  Vaping Use   Vaping status: Never Used  Substance and Sexual Activity   Alcohol use: Yes    Alcohol/week: 0.0 standard drinks of alcohol    Comment: 1 drink of wine 1-2 times a week   Drug use: No   Sexual activity: Yes    Partners: Male   Tobacco Counseling Counseling given: No  SDOH Screenings   Food Insecurity: No Food Insecurity (05/14/2024)  Housing: Unknown (05/14/2024)  Transportation Needs: No Transportation Needs (05/14/2024)  Utilities: Not At Risk (05/14/2024)  Alcohol Screen: Low Risk  (08/02/2023)  Depression (PHQ2-9): Low Risk  (05/14/2024)  Financial Resource Strain: Low Risk  (08/02/2023)  Physical Activity: Sufficiently Active (05/14/2024)  Social Connections: Socially Integrated (05/14/2024)  Stress: No Stress Concern Present (05/14/2024)  Tobacco Use: Low Risk   (05/14/2024)  Health Literacy: Adequate Health Literacy (05/14/2024)   See flowsheets for full screening details  Depression Screen PHQ 2 & 9 Depression Scale- Over the past 2 weeks, how often have you been bothered by any of the following problems? Little interest or pleasure in doing things: 0 Feeling down, depressed, or hopeless (PHQ Adolescent also includes...irritable): 0 PHQ-2 Total Score: 0     Goals Addressed               This Visit's Progress     Continue physical activity (pt-stated)        Remain active       Visit info / Clinical Intake: Medicare Wellness Visit Type:: Subsequent Annual Wellness Visit Persons participating in visit:: patient Medicare Wellness Visit Mode:: Telephone If telephone:: video declined Because this visit was a virtual/telehealth visit:: pt reported vitals If Telephone or Video please confirm:: I connected with the patient using audio enabled telemedicine application and verified that I am speaking with the correct person using two identifiers Patient Location:: Home Provider Location:: Office Information given by:: patient Interpreter Needed?: No Pre-visit prep was completed: no AWV questionnaire completed by patient prior to visit?: no Living arrangements:: lives with spouse/significant other Patient's Overall Health Status Rating: very good Typical amount of pain: none Does pain affect daily life?: no Are you currently prescribed opioids?: no  Dietary Habits and Nutritional  Risks How many meals a day?: 3 Eats fruit and vegetables daily?: yes Most meals are obtained by: preparing own meals In the last 2 weeks, have you had any of the following?: none Diabetic:: no  Functional Status Activities of Daily Living (to include ambulation/medication): Independent Ambulation: Independent with device- listed below Home Assistive Devices/Equipment: Eyeglasses Medication Administration: Independent Home Management:  Independent Manage your own finances?: yes Primary transportation is: driving Concerns about vision?: no *vision screening is required for WTM* Concerns about hearing?: no  Fall Screening Falls in the past year?: 0 Number of falls in past year: 0 Was there an injury with Fall?: 0 Fall Risk Category Calculator: 0 Patient Fall Risk Level: Low Fall Risk  Fall Risk Patient at Risk for Falls Due to: No Fall Risks  Home and Transportation Safety: All rugs have non-skid backing?: yes All stairs or steps have railings?: yes Grab bars in the bathtub or shower?: yes Have non-skid surface in bathtub or shower?: yes Good home lighting?: yes Regular seat belt use?: yes Hospital stays in the last year:: no  Cognitive Assessment Difficulty concentrating, remembering, or making decisions? : no Will 6CIT or Mini Cog be Completed: no 6CIT or Mini Cog Declined: patient alert, oriented, able to answer questions appropriately and recall recent events  Advance Directives (For Healthcare) Does Patient Have a Medical Advance Directive?: Yes Does patient want to make changes to medical advance directive?: No - Patient declined Type of Advance Directive: Healthcare Power of [place]; Living will Copy of Healthcare Power of Attorney in Chart?: Yes - validated most recent copy scanned in chart (See row information) Copy of Living Will in Chart?: Yes - validated most recent copy scanned in chart (See row information)  Reviewed/Updated  Reviewed/Updated: Reviewed All (Medical, Surgical, Family, Medications, Allergies, Care Teams, Patient Goals)        Objective:    Today's Vitals   05/14/24 0903  Weight: 121 lb (54.9 kg)  Height: 5' 1.5 (1.562 m)   Body mass index is 22.49 kg/m.  Current Medications (verified) Outpatient Encounter Medications as of 05/14/2024  Medication Sig   CALCIUM PO Take by mouth daily.   clobetasol cream (TEMOVATE) 0.05 % Apply topically as needed.   COLLAGEN PO  Take by mouth daily.   HUMIRA PEN 40 MG/0.8ML PNKT Inject every other week for sarcoidosis   metoprolol  tartrate (LOPRESSOR ) 100 MG tablet Take 1 tablet (100 mg total) by mouth once for 1 dose. Take two hours prior to ct (Patient not taking: Reported on 03/07/2024)   Multiple Vitamin (MULTIVITAMIN) tablet Take 1 tablet by mouth daily.   VITAMIN D  PO Take 5,000 Units by mouth daily.   No facility-administered encounter medications on file as of 05/14/2024.   Hearing/Vision screen Hearing Screening - Comments:: Denies hearing difficulties   Vision Screening - Comments:: Wears rx glasses - up to date with routine eye exams with  [facility] Immunizations and Health Maintenance Health Maintenance  Topic Date Due   DTaP/Tdap/Td (2 - Td or Tdap) 02/19/2017   Mammogram  06/09/2022   Influenza Vaccine  01/20/2024   COVID-19 Vaccine (5 - 2025-26 season) 02/20/2024   COLON CANCER SCREENING ANNUAL FOBT  04/09/2024   Medicare Annual Wellness (AWV)  05/14/2025   Fecal DNA (Cologuard)  07/04/2026   Pneumococcal Vaccine: 50+ Years  Completed   Bone Density Scan  Completed   Hepatitis C Screening  Completed   Zoster Vaccines- Shingrix  Completed   Meningococcal B Vaccine  Aged Out  Colonoscopy  Discontinued        Assessment/Plan:  This is a routine wellness examination for Mariesha.  Patient Care Team: Panosh, Apolinar POUR, MD as PCP - General (Internal Medicine) Josepha Elsie Shropshire, MD as Referring Physician (Dermatology) Neda Jennet LABOR, MD as Consulting Physician (Pulmonary Disease)  I have personally reviewed and noted the following in the patient's chart:   Medical and social history Use of alcohol, tobacco or illicit drugs  Current medications and supplements including opioid prescriptions. Functional ability and status Nutritional status Physical activity Advanced directives List of other physicians Hospitalizations, surgeries, and ER visits in previous 12  months Vitals Screenings to include cognitive, depression, and falls Referrals and appointments  Orders Placed This Encounter  Procedures   MM 3D DIAGNOSTIC MAMMOGRAM BILATERAL BREAST    Standing Status:   Future    Expiration Date:   05/14/2025    Reason for Exam (SYMPTOM  OR DIAGNOSIS REQUIRED):   Breast cancer screening    Preferred imaging location?:   [facility]   In addition, I have reviewed and discussed with patient certain preventive protocols, quality metrics, and best practice recommendations. A written personalized care plan for preventive services as well as general preventive health recommendations were provided to patient.   Rojelio LELON Blush, LPN   88/75/7974   Return in 1 year on 05/20/25  After Visit Summary: (MyChart) Due to this being a telephonic visit, the after visit summary with patients personalized plan was offered to patient via MyChart   Nurse Notes: None

## 2024-05-14 NOTE — Patient Instructions (Addendum)
 Courtney Bray,  Thank you for taking the time for your Medicare Wellness Visit. I appreciate your continued commitment to your health goals. Please review the care plan we discussed, and feel free to reach out if I can assist you further.  Please note that Annual Wellness Visits do not include a physical exam. Some assessments may be limited, especially if the visit was conducted virtually. If needed, we may recommend an in-person follow-up with your provider.  Ongoing Care Seeing your primary care provider every 3 to 6 months helps us  monitor your health and provide consistent, personalized care.    Referrals If a referral was made during today's visit and you haven't received any updates within two weeks, please contact the referred provider directly to check on the status.  Recommended Screenings:  Health Maintenance  Topic Date Due   DTaP/Tdap/Td vaccine (2 - Td or Tdap) 02/19/2017   Breast Cancer Screening  06/09/2022   Flu Shot  01/20/2024   COVID-19 Vaccine (5 - 2025-26 season) 02/20/2024   Stool Blood Test  04/09/2024   Medicare Annual Wellness Visit  05/14/2025   Cologuard (Stool DNA test)  07/04/2026   Pneumococcal Vaccine for age over 69  Completed   Osteoporosis screening with Bone Density Scan  Completed   Hepatitis C Screening  Completed   Zoster (Shingles) Vaccine  Completed   Meningitis B Vaccine  Aged Out   Colon Cancer Screening  Discontinued       05/14/2024    9:04 AM  Advanced Directives  Does Patient Have a Medical Advance Directive? Yes  Type of Estate Agent of [place];Living will  Does patient want to make changes to medical advance directive? No - Patient declined  Copy of Healthcare Power of Attorney in Chart? Yes - validated most recent copy scanned in chart (See row information)    Vision: Annual vision screenings are recommended for early detection of glaucoma, cataracts, and diabetic retinopathy. These exams can also reveal  signs of chronic conditions such as diabetes and high blood pressure.  Dental: Annual dental screenings help detect early signs of oral cancer, gum disease, and other conditions linked to overall health, including heart disease and diabetes.  Please see the attached documents for additional preventive care recommendations.

## 2024-05-28 ENCOUNTER — Ambulatory Visit: Payer: Medicare HMO | Admitting: Internal Medicine

## 2024-06-05 ENCOUNTER — Encounter: Payer: Self-pay | Admitting: Internal Medicine

## 2024-06-05 ENCOUNTER — Ambulatory Visit: Admitting: Internal Medicine

## 2024-06-05 ENCOUNTER — Other Ambulatory Visit

## 2024-06-05 VITALS — BP 130/80 | HR 95 | Ht 61.5 in | Wt 123.2 lb

## 2024-06-05 DIAGNOSIS — E559 Vitamin D deficiency, unspecified: Secondary | ICD-10-CM

## 2024-06-05 DIAGNOSIS — M810 Age-related osteoporosis without current pathological fracture: Secondary | ICD-10-CM

## 2024-06-05 NOTE — Progress Notes (Signed)
 Patient ID: Courtney Bray, female   DOB: 08/15/1950, 73 y.o.   MRN: [id number]   HPI  Courtney Bray is a 73 y.o.-year-old female, initially referred by her PCP, Dr. Charlett, with need for follow-up for osteoporosis (OP).  Last visit 1 year ago.  Interim history: No falls or fractures since last visit.   No dizziness/orthostasis/poor vision. She had occasional vertigo - not since last OV She is eating a mostly plant-based diet but added some meat back before last visit. She now works out (at [employer]) with a psychologist, educational for the last 1.5 mo - weight training 2x times a week. Also stretching, walking. She stopped going to [facility] when started exercising in 2024.  Reviewed history: Pt was dx with OP in 2011.  I reviewed pt's DXA scan reports along with the pt: Date L1-L4 T score FN T score 33% distal Radius  [facility]  -3.8 RFN: -2.8 LFN: -2.9 n/a  05/27/2015 ([facility])  -3.8 RFN: -2.6 LFN: -2.6 n/a  05/01/2010 [facility])  -3.7 RFN: -2.5 LFN: -2.7 n/a   No Previous OP treatments.    At last visit, we discussed about Prolia and the PA was approved, with a co-pay.  However, she declined pharmaceutical interventions afterwards.  She was going to [facility] for 6 mo before the Coronavs. pandemic in 08/2018.  She restarted going afterwards, but stopped in 2024 when she started to work with a systems analyst.  She has a h/o vitamin D  deficiency. Reviewed available vit D levels: Lab Results  Component Value Date   VD25OH 35 05/25/2023   VD25OH 54.29 09/18/2021   VD25OH 41.2 08/20/2020   VD25OH 45 02/07/2020   VD25OH 14.83 (L) 06/26/2019   VD25OH 21.17 (L) 04/08/2017   Pt is on: - vitamin D  5000 units daily  - Algae Cal with vit D, K2, Calcium, Strontium   She does not take high vitamin A doses. She has been on steroids in the past >> occasionally inj's and tapers, but not for sarcoidosis. She was on PPIs in the past for 1 year >> stopped.  Menopause was at ~73  y/o.   FH of osteoporosis: mother in her 58s (pelvic fx. And hip fx.)  No h/o hyper/hypocalcemia or hyperparathyroidism. No h/o kidney stones. Lab Results  Component Value Date   PTH 24 06/30/2020   CALCIUM 9.7 11/02/2023   CALCIUM 9.1 07/24/2023   CALCIUM 9.5 08/04/2022   CALCIUM 9.4 07/14/2021   CALCIUM 9.1 08/20/2020   CALCIUM 9.4 06/30/2020   CALCIUM 9.3 06/26/2019   CALCIUM 8.8 05/30/2018   CALCIUM 9.4 04/08/2017   CALCIUM 9.2 03/26/2016   No h/o thyrotoxicosis. Reviewed TSH recent levels:  Lab Results  Component Value Date   TSH 1.64 08/04/2022   TSH 1.47 07/14/2021   TSH 1.37 06/30/2020   TSH 1.53 03/26/2016   TSH 1.35 03/25/2015   No h/o CKD. 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  She sees Dr. Josepha at [facility] dermatology She has a low white blood cell count.  ROS: + see HPI + joint aches (left hip)  I reviewed pt's medications, allergies, PMH, social hx, family hx, and changes were documented in the history of present illness. Otherwise, unchanged from my initial visit note.  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  PFT's 2010:  No obstruction, TLC 3.93 (87%), DLCO 89%. PFT's 2014:  No obstruction, TLC 3.07, DLCO 91%     Past Surgical History:  Procedure Laterality Date   TUBAL LIGATION  1979   UTERINE ARTERY EMBOLIZATION  2000   Social History   Socioeconomic History   Marital status: Married    Spouse name: Not on file   Number of children: 4   Years of education: Not on file   Highest education level: 12th grade  Occupational History   Occupation: Self-employed  Tobacco Use   Smoking status: Never   Smokeless tobacco: Never   Vaping Use   Vaping status: Never Used  Substance and Sexual Activity   Alcohol use: Yes    Alcohol/week: 0.0 standard drinks of alcohol    Comment: 1 drink of wine 1-2 times a week   Drug use: No   Sexual activity: Yes    Partners: Male  Other Topics Concern   Not on file  Social History Narrative   5-6 hours of sleep per night   Lives with her husband who is retired engineer, 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   Orig from [place]   in [place] since 1988   [name]    Social Drivers of Health   Tobacco Use: Low Risk (05/30/2024)   Received from [facility]   Patient History    Smoking Tobacco Use: Never    Smokeless Tobacco Use: Never    Passive Exposure: Not on file  Financial Resource Strain: Low Risk (08/02/2023)   Overall Financial Resource Strain (CARDIA)    Difficulty of Paying Living Expenses: Not hard at all  Food Insecurity: No Food Insecurity (05/14/2024)   Epic    Worried About Programme Researcher, Broadcasting/film/video in the Last Year: Never true    Ran Out of Food in the Last Year: Never true  Transportation Needs: No Transportation Needs (05/14/2024)   Epic    Lack of Transportation (Medical): No    Lack of Transportation (Non-Medical): No  Physical Activity: Sufficiently Active (05/14/2024)   Exercise Vital Sign    Days of Exercise per Week: 4 days    Minutes of Exercise per Session: 60 min  Stress: No Stress Concern Present (05/14/2024)   [employer] of Occupational Health - Occupational Stress Questionnaire    Feeling of Stress: Not at all  Social Connections: Socially Integrated (05/14/2024)   Social Connection and Isolation Panel    Frequency of Communication with Friends and Family: More than three times a week    Frequency of Social Gatherings with Friends and Family: More than three times a  week    Attends Religious Services: More than 4 times per year    Active Member of [employer] or Organizations: Yes    Attends Banker Meetings: More than 4 times per year    Marital Status: Married  Catering Manager Violence: Not At Risk (05/14/2024)   Epic    Fear of Current or Ex-Partner: No    Emotionally Abused: No    Physically  Abused: No    Sexually Abused: No  Depression (PHQ2-9): Low Risk (05/14/2024)   Depression (PHQ2-9)    PHQ-2 Score: 0  Alcohol Screen: Low Risk (08/02/2023)   Alcohol Screen    Last Alcohol Screening Score (AUDIT): 2  Housing: Unknown (05/14/2024)   Epic    Unable to Pay for Housing in the Last Year: No    Number of Times Moved in the Last Year: Not on file    Homeless in the Last Year: No  Utilities: Not At Risk (05/14/2024)   Epic    Threatened with loss of utilities: No  Health Literacy: Adequate Health Literacy (05/14/2024)   B1300 Health Literacy    Frequency of need for help with medical instructions: Never   Current Outpatient Medications on File Prior to Visit  Medication Sig Dispense Refill   CALCIUM PO Take by mouth daily.     clobetasol cream (TEMOVATE) 0.05 % Apply topically as needed.     COLLAGEN PO Take by mouth daily.     HUMIRA PEN 40 MG/0.8ML PNKT Inject every other week for sarcoidosis     metoprolol  tartrate (LOPRESSOR ) 100 MG tablet Take 1 tablet (100 mg total) by mouth once for 1 dose. Take two hours prior to ct (Patient not taking: Reported on 03/07/2024) 1 tablet 0   Multiple Vitamin (MULTIVITAMIN) tablet Take 1 tablet by mouth daily.     VITAMIN D  PO Take 5,000 Units by mouth daily.     No current facility-administered medications on file prior to visit.   Allergies  Allergen Reactions   Amoxicillin  Rash   Cholecalciferol Rash   Vitamin D  Analogs Rash    high dose  Pill had rash    Family History  Problem Relation Age of Onset   Heart disease Father    Stroke Father    Dementia Father    High blood  pressure Father    Dementia Mother    High blood pressure Mother    High blood pressure Sister        with hypertesnive renal disease   PE: BP 130/80   Pulse 95   Ht 5' 1.5 (1.562 m)   Wt 123 lb 3.2 oz (55.9 kg)   SpO2 98%   BMI 22.90 kg/m  Wt Readings from Last 3 Encounters:  06/05/24 123 lb 3.2 oz (55.9 kg)  05/14/24 121 lb (54.9 kg)  03/07/24 121 lb 9.6 oz (55.2 kg)   Constitutional: Normal weight, in NAD. No kyphosis. Eyes:  EOMI, no exophthalmos ENT: no neck masses, no cervical lymphadenopathy Cardiovascular: Tachycardia, RR, No MRG Respiratory: CTA B Musculoskeletal: no deformities Skin:no rashes Neurological: no tremor with outstretched hands  Assessment: 1. Osteoporosis  2.  Vitamin D  deficiency  Plan: 1. Osteoporosis - Likely postmenopausal/age-related and she also has a family history of osteoporosis - We reviewed together her previous 3 bone density report and her T-scores.  She is at high risk for fractures due to the low T-scores, under -2.5.  Also, her T-scores decreased from 2011-20 21 but they were not completely comparable since each of the 3 scans were done on different machines.  We decided to continue on the same machine at the breast center going forward.  She missed her bone density in 05/2022 and when I last saw her in 2024, I ordered another scan, but she still did not have this done.  Unfortunately, the breast center is not doing bone density scans anymore and we will need to obtain  another bone density scan on yet another machine. - We previously discussed about pharmaceutical treatment for osteoporosis but after comprehensive discussions about the mechanism of action of different osteoporosis medications, and overwhelming benefits versus possible side effects, she chose to manage her osteoporosis without pharmaceutical intervention.  - We also discussed about weightbearing exercises and she did start to work out with a trainer before last visit.  Her  vitamin D  levels remains normal on supplementation.  She is also getting a good amount of calcium in the diet and from [facility].  She stopped going to [facility] 1.5 years ago after she started working with a psychologist, educational.  At last visit I advised her to do weightbearing exercises 30 minutes at least 5 out of 7 days.  She continues to do so. - She is lactose intolerant and cannot eat dairy.  She is mostly plant-based foods, low in acid.  I advised her that this is excellent but to try to maintain a good amount of protein in the diet, 0.8 g/kg's per day, approximately 45 g a day for her.  Before last visit, she introduced some meat. - At today's visit, we again discussed about checking another bone density scan and she agrees to have this checked but she is still reticent to start medication.  Since she is on Humira, Reclast would be preferred for her rather than Prolia, but Prolia is not completely contraindicated. -At today's visit we will recheck her vitamin D .  Calcium and kidney function were normal in 10/2023.  We will not repeat this today. - I will see her back in a year but we will be in touch about her results.  She does mention that she is open to go back to [facility] if the bone density is lower.  2.  Vitamin D  deficiency - She was previously on ergocalciferol  50,000 units weekly, but she did not tolerate this well - She continues on 5000 units vitamin D  daily + the amount and Algae Cal - Latest vitamin D  level was normal at last visit - We will recheck the level today  Orders Placed This Encounter  Procedures   DG Bone Density   VITAMIN D  25 Hydroxy (Vit-D Deficiency, Fractures)   Lela Fendt, MD PhD [facility]

## 2024-06-05 NOTE — Patient Instructions (Signed)
 Please stop at the lab.  Please continue vitamin D  5000 units daily.  Please try to get ~1200 mg calcium, preferentially from the diet.  Please call and schedule bone density scan at the [facility] Office: [phone].   Please return for another visit in 1 year.

## 2024-06-06 ENCOUNTER — Ambulatory Visit: Payer: Self-pay | Admitting: Internal Medicine

## 2024-06-06 LAB — VITAMIN D 25 HYDROXY (VIT D DEFICIENCY, FRACTURES): Vit D, 25-Hydroxy: 44 ng/mL (ref 30–100)

## 2024-06-07 ENCOUNTER — Encounter: Payer: Self-pay | Admitting: Family Medicine

## 2024-06-07 ENCOUNTER — Ambulatory Visit: Admitting: Family Medicine

## 2024-06-07 VITALS — BP 120/58 | HR 95 | Temp 100.1°F | Ht 61.5 in | Wt 122.1 lb

## 2024-06-07 DIAGNOSIS — R509 Fever, unspecified: Secondary | ICD-10-CM

## 2024-06-07 DIAGNOSIS — J101 Influenza due to other identified influenza virus with other respiratory manifestations: Secondary | ICD-10-CM | POA: Diagnosis not present

## 2024-06-07 DIAGNOSIS — R059 Cough, unspecified: Secondary | ICD-10-CM

## 2024-06-07 LAB — POCT INFLUENZA A/B
Influenza A, POC: POSITIVE — AB
Influenza B, POC: NEGATIVE

## 2024-06-07 LAB — POC COVID19 BINAXNOW: SARS Coronavirus 2 Ag: NEGATIVE

## 2024-06-07 MED ORDER — OSELTAMIVIR PHOSPHATE 75 MG PO CAPS: 75.0000 mg | ORAL_CAPSULE | Freq: Two times a day (BID) | ORAL | 0 refills | Status: AC

## 2024-06-07 MED ORDER — PROMETHAZINE-DM 6.25-15 MG/5ML PO SYRP: 5.0000 mL | ORAL_SOLUTION | Freq: Four times a day (QID) | ORAL | 0 refills | Status: AC | PRN

## 2024-06-07 NOTE — Progress Notes (Signed)
 Acute Office Visit  Subjective:     Patient ID: Courtney Bray, female    DOB: Apr 24, 1951, 73 y.o.   MRN: [id number]  Chief Complaint  Patient presents with   Cough    Non-productive x2 days, tried Hycodan cough syrup given by Dr Charlett some time ago with relief    HPI Pt is here for 2 day history of coughing, nasal congestion, and fever/chills. She reports no recent travel, no known sick contacts, reports malaise and body aches. No chest pain, no SOB or difficulty breathing, cough is relatively non prodcutive.   Review of Systems  All other systems reviewed and are negative.       Objective:    BP (!) 120/58   Pulse 95   Temp 100.1 F (37.8 C) (Oral)   Ht 5' 1.5 (1.562 m)   Wt 122 lb 1.6 oz (55.4 kg)   SpO2 98%   BMI 22.70 kg/m    Physical Exam Vitals reviewed.  Constitutional:      Appearance: Normal appearance. She is normal weight.  HENT:     Nose: Rhinorrhea present.     Mouth/Throat:     Mouth: Mucous membranes are moist.     Pharynx: No posterior oropharyngeal erythema.  Eyes:     Conjunctiva/sclera: Conjunctivae normal.  Cardiovascular:     Rate and Rhythm: Normal rate and regular rhythm.     Heart sounds: Normal heart sounds. No murmur heard. Pulmonary:     Effort: Pulmonary effort is normal.     Breath sounds: Normal breath sounds. No wheezing, rhonchi or rales.  Neurological:     Mental Status: She is alert and oriented to person, place, and time. Mental status is at baseline.  Psychiatric:        Mood and Affect: Mood normal.        Behavior: Behavior normal.     Results for orders placed or performed in visit on 06/07/24  POC COVID-19  Result Value Ref Range   SARS Coronavirus 2 Ag Negative Negative  POC Influenza A/B  Result Value Ref Range   Influenza A, POC Positive (A) Negative   Influenza B, POC Negative Negative        Assessment & Plan:   Problem List Items Addressed This Visit   None Visit Diagnoses       Cough,  unspecified type    -  Primary   Relevant Orders   POC COVID-19 (Completed)   POC Influenza A/B (Completed)     Fever, unspecified fever cause       Relevant Orders   POC Influenza A/B (Completed)     Influenza A       Relevant Medications   oseltamivir  (TAMIFLU ) 75 MG capsule   promethazine -dextromethorphan (PROMETHAZINE -DM) 6.25-15 MG/5ML syrup     Pt is positive for Flu A-- she is within the 48 hour window to receive tamiflu  therapy. I advised continued symptomatic care with OTC medications as needed.   Meds ordered this encounter  Medications   oseltamivir  (TAMIFLU ) 75 MG capsule    Sig: Take 1 capsule (75 mg total) by mouth 2 (two) times daily.    Dispense:  10 capsule    Refill:  0   promethazine -dextromethorphan (PROMETHAZINE -DM) 6.25-15 MG/5ML syrup    Sig: Take 5-10 mLs by mouth 4 (four) times daily as needed for cough.    Dispense:  240 mL    Refill:  0    No follow-ups on file.  Heron CHRISTELLA Sharper, MD

## 2024-06-12 ENCOUNTER — Ambulatory Visit

## 2024-07-06 ENCOUNTER — Ambulatory Visit
Admission: RE | Admit: 2024-07-06 | Discharge: 2024-07-06 | Disposition: A | Discharge status: Home / Self Care | Source: Ambulatory Visit | Attending: Internal Medicine | Admitting: Internal Medicine

## 2024-07-06 DIAGNOSIS — Z1231 Encounter for screening mammogram for malignant neoplasm of breast: Secondary | ICD-10-CM

## 2025-05-20 ENCOUNTER — Ambulatory Visit

## 2025-06-05 ENCOUNTER — Ambulatory Visit: Admitting: Internal Medicine
